# Patient Record
Sex: Male | Born: 1955
Health system: Southern US, Community
[De-identification: ages and names within clinical notes are randomized; demographics above are authoritative.]

## PROBLEM LIST (undated history)

## (undated) DIAGNOSIS — B182 Chronic viral hepatitis C: Secondary | ICD-10-CM

## (undated) DIAGNOSIS — H409 Unspecified glaucoma: Secondary | ICD-10-CM

## (undated) DIAGNOSIS — I10 Essential (primary) hypertension: Secondary | ICD-10-CM

## (undated) DIAGNOSIS — M159 Polyosteoarthritis, unspecified: Secondary | ICD-10-CM

## (undated) DIAGNOSIS — F319 Bipolar disorder, unspecified: Secondary | ICD-10-CM

## (undated) DIAGNOSIS — R350 Frequency of micturition: Secondary | ICD-10-CM

## (undated) DIAGNOSIS — M549 Dorsalgia, unspecified: Secondary | ICD-10-CM

## (undated) DIAGNOSIS — E785 Hyperlipidemia, unspecified: Secondary | ICD-10-CM

## (undated) DIAGNOSIS — M1990 Unspecified osteoarthritis, unspecified site: Secondary | ICD-10-CM

## (undated) DIAGNOSIS — K7460 Unspecified cirrhosis of liver: Secondary | ICD-10-CM

## (undated) DIAGNOSIS — B354 Tinea corporis: Secondary | ICD-10-CM

## (undated) DIAGNOSIS — I639 Cerebral infarction, unspecified: Secondary | ICD-10-CM

## (undated) HISTORY — DX: Frequency of micturition: R35.0

## (undated) HISTORY — DX: Unspecified glaucoma: H40.9

## (undated) HISTORY — DX: Bipolar disorder, unspecified: F31.9

## (undated) HISTORY — DX: Chronic viral hepatitis C: B18.2

## (undated) HISTORY — DX: Polyosteoarthritis, unspecified: M15.9

## (undated) HISTORY — DX: Unspecified cirrhosis of liver: K74.60

## (undated) HISTORY — PX: ABCESS DRAINAGE: SHX399

## (undated) HISTORY — DX: Unspecified osteoarthritis, unspecified site: M19.90

## (undated) HISTORY — DX: Tinea corporis: B35.4

---

## 2001-06-04 ENCOUNTER — Emergency Department (HOSPITAL_COMMUNITY): Admission: EM | Admit: 2001-06-04 | Discharge: 2001-06-04 | Payer: Self-pay | Admitting: Emergency Medicine

## 2001-06-04 ENCOUNTER — Encounter: Payer: Self-pay | Admitting: Emergency Medicine

## 2002-03-23 ENCOUNTER — Encounter: Payer: Self-pay | Admitting: Emergency Medicine

## 2002-03-23 ENCOUNTER — Emergency Department (HOSPITAL_COMMUNITY): Admission: EM | Admit: 2002-03-23 | Discharge: 2002-03-23 | Payer: Self-pay | Admitting: Emergency Medicine

## 2002-04-09 ENCOUNTER — Encounter: Admission: RE | Admit: 2002-04-09 | Discharge: 2002-07-08 | Payer: Self-pay | Admitting: Orthopaedic Surgery

## 2002-08-08 ENCOUNTER — Emergency Department (HOSPITAL_COMMUNITY): Admission: EM | Admit: 2002-08-08 | Discharge: 2002-08-08 | Payer: Self-pay | Admitting: Emergency Medicine

## 2002-08-08 ENCOUNTER — Encounter: Payer: Self-pay | Admitting: Emergency Medicine

## 2003-08-24 ENCOUNTER — Encounter: Payer: Self-pay | Admitting: Emergency Medicine

## 2003-08-24 ENCOUNTER — Emergency Department (HOSPITAL_COMMUNITY): Admission: EM | Admit: 2003-08-24 | Discharge: 2003-08-24 | Payer: Self-pay | Admitting: Emergency Medicine

## 2003-09-10 ENCOUNTER — Ambulatory Visit (HOSPITAL_COMMUNITY): Admission: RE | Admit: 2003-09-10 | Discharge: 2003-09-10 | Payer: Self-pay | Admitting: Internal Medicine

## 2003-09-10 ENCOUNTER — Encounter: Payer: Self-pay | Admitting: Internal Medicine

## 2003-11-15 ENCOUNTER — Emergency Department (HOSPITAL_COMMUNITY): Admission: AD | Admit: 2003-11-15 | Discharge: 2003-11-15 | Payer: Self-pay | Admitting: Family Medicine

## 2004-03-27 ENCOUNTER — Encounter: Admission: RE | Admit: 2004-03-27 | Discharge: 2004-03-27 | Payer: Self-pay | Admitting: Internal Medicine

## 2004-12-28 ENCOUNTER — Emergency Department (HOSPITAL_COMMUNITY): Admission: EM | Admit: 2004-12-28 | Discharge: 2004-12-28 | Payer: Self-pay | Admitting: Emergency Medicine

## 2005-01-19 ENCOUNTER — Ambulatory Visit: Payer: Self-pay | Admitting: Internal Medicine

## 2005-03-20 ENCOUNTER — Ambulatory Visit (HOSPITAL_COMMUNITY): Admission: RE | Admit: 2005-03-20 | Discharge: 2005-03-20 | Payer: Self-pay | Admitting: Gastroenterology

## 2005-06-18 ENCOUNTER — Ambulatory Visit (HOSPITAL_COMMUNITY): Admission: RE | Admit: 2005-06-18 | Discharge: 2005-06-18 | Payer: Self-pay | Admitting: Internal Medicine

## 2005-06-18 ENCOUNTER — Ambulatory Visit: Payer: Self-pay | Admitting: Internal Medicine

## 2005-11-27 ENCOUNTER — Encounter (INDEPENDENT_AMBULATORY_CARE_PROVIDER_SITE_OTHER): Payer: Self-pay | Admitting: Specialist

## 2005-11-27 ENCOUNTER — Other Ambulatory Visit: Admission: RE | Admit: 2005-11-27 | Discharge: 2005-11-27 | Payer: Self-pay | Admitting: Infectious Diseases

## 2005-11-27 ENCOUNTER — Ambulatory Visit: Payer: Self-pay | Admitting: Internal Medicine

## 2005-12-28 ENCOUNTER — Ambulatory Visit: Payer: Self-pay | Admitting: Hospitalist

## 2006-01-03 ENCOUNTER — Encounter: Admission: RE | Admit: 2006-01-03 | Discharge: 2006-04-03 | Payer: Self-pay | Admitting: Family Medicine

## 2006-02-01 ENCOUNTER — Ambulatory Visit (HOSPITAL_COMMUNITY): Admission: RE | Admit: 2006-02-01 | Discharge: 2006-02-01 | Payer: Self-pay | Admitting: Internal Medicine

## 2006-02-01 ENCOUNTER — Ambulatory Visit: Payer: Self-pay | Admitting: Internal Medicine

## 2006-02-05 ENCOUNTER — Ambulatory Visit: Payer: Self-pay | Admitting: Internal Medicine

## 2006-02-27 ENCOUNTER — Ambulatory Visit: Payer: Self-pay | Admitting: Internal Medicine

## 2006-06-12 ENCOUNTER — Ambulatory Visit: Payer: Self-pay | Admitting: Internal Medicine

## 2006-10-08 ENCOUNTER — Encounter (INDEPENDENT_AMBULATORY_CARE_PROVIDER_SITE_OTHER): Payer: Self-pay | Admitting: Internal Medicine

## 2006-10-08 ENCOUNTER — Ambulatory Visit: Payer: Self-pay | Admitting: Internal Medicine

## 2006-10-08 LAB — CONVERTED CEMR LAB
BUN: 12 mg/dL (ref 6–23)
CO2: 25 meq/L (ref 19–32)
Calcium: 9.4 mg/dL (ref 8.4–10.5)
Chloride: 106 meq/L (ref 96–112)
Creatinine, Ser: 1.05 mg/dL (ref 0.40–1.50)
Glucose, Bld: 86 mg/dL (ref 70–99)
Potassium: 3.8 meq/L (ref 3.5–5.3)
Sodium: 139 meq/L (ref 135–145)

## 2006-10-17 ENCOUNTER — Encounter (INDEPENDENT_AMBULATORY_CARE_PROVIDER_SITE_OTHER): Payer: Self-pay | Admitting: *Deleted

## 2006-10-17 ENCOUNTER — Ambulatory Visit: Payer: Self-pay | Admitting: Internal Medicine

## 2006-10-17 LAB — CONVERTED CEMR LAB
AST: 66 units/L — ABNORMAL HIGH (ref 0–37)
Alkaline Phosphatase: 74 units/L (ref 39–117)
BUN: 21 mg/dL (ref 6–23)
Creatinine, Ser: 1.04 mg/dL (ref 0.40–1.50)
Glucose, Bld: 89 mg/dL (ref 70–99)
HDL: 40 mg/dL (ref >39)
LDL Cholesterol: 105 mg/dL — ABNORMAL HIGH (ref 0–99)
Total Bilirubin: 0.6 mg/dL (ref 0.3–1.2)
Total CHOL/HDL Ratio: 4.5
Triglycerides: 177 mg/dL — ABNORMAL HIGH (ref <150)

## 2006-11-04 ENCOUNTER — Ambulatory Visit: Payer: Self-pay | Admitting: Hospitalist

## 2006-11-06 DIAGNOSIS — F528 Other sexual dysfunction not due to a substance or known physiological condition: Secondary | ICD-10-CM

## 2006-11-06 DIAGNOSIS — I10 Essential (primary) hypertension: Secondary | ICD-10-CM | POA: Insufficient documentation

## 2006-11-06 DIAGNOSIS — K219 Gastro-esophageal reflux disease without esophagitis: Secondary | ICD-10-CM

## 2006-11-06 DIAGNOSIS — M545 Low back pain: Secondary | ICD-10-CM

## 2006-11-08 ENCOUNTER — Ambulatory Visit (HOSPITAL_COMMUNITY): Admission: RE | Admit: 2006-11-08 | Discharge: 2006-11-08 | Payer: Self-pay | Admitting: *Deleted

## 2006-11-13 ENCOUNTER — Ambulatory Visit: Payer: Self-pay | Admitting: Internal Medicine

## 2006-11-13 ENCOUNTER — Encounter (INDEPENDENT_AMBULATORY_CARE_PROVIDER_SITE_OTHER): Payer: Self-pay | Admitting: *Deleted

## 2006-11-13 LAB — CONVERTED CEMR LAB
ALT: 65 units/L — ABNORMAL HIGH (ref 0–53)
AST: 70 units/L — ABNORMAL HIGH (ref 0–37)
BUN: 13 mg/dL (ref 6–23)
CO2: 28 meq/L (ref 19–32)
Calcium: 8.8 mg/dL (ref 8.4–10.5)
Creatinine, Ser: 0.8 mg/dL (ref 0.40–1.50)
GGT: 54 units/L — ABNORMAL HIGH (ref 7–51)
Hemoglobin, Urine: NEGATIVE
Hep A Total Ab: POSITIVE — AB
Hep B Core Total Ab: POSITIVE — AB
Hep B S Ab: POSITIVE — AB
Ketones, ur: NEGATIVE mg/dL
Leukocytes, UA: NEGATIVE
Lipase: 40 units/L (ref 11–59)
Protein, ur: NEGATIVE mg/dL
Total Bilirubin: 0.9 mg/dL (ref 0.3–1.2)
Urine Glucose: NEGATIVE mg/dL
pH: 5.5 (ref 5.0–8.0)

## 2006-11-20 ENCOUNTER — Ambulatory Visit: Payer: Self-pay | Admitting: Internal Medicine

## 2006-11-20 ENCOUNTER — Encounter (INDEPENDENT_AMBULATORY_CARE_PROVIDER_SITE_OTHER): Payer: Self-pay | Admitting: *Deleted

## 2006-11-20 LAB — CONVERTED CEMR LAB
BUN: 14 mg/dL (ref 6–23)
CO2: 24 meq/L (ref 19–32)
Calcium: 9.2 mg/dL (ref 8.4–10.5)
Chloride: 106 meq/L (ref 96–112)
Creatinine, Ser: 0.87 mg/dL (ref 0.40–1.50)
Glucose, Bld: 92 mg/dL (ref 70–99)

## 2006-11-29 ENCOUNTER — Encounter (INDEPENDENT_AMBULATORY_CARE_PROVIDER_SITE_OTHER): Payer: Self-pay | Admitting: Internal Medicine

## 2006-11-29 ENCOUNTER — Ambulatory Visit: Payer: Self-pay | Admitting: Internal Medicine

## 2006-11-29 LAB — CONVERTED CEMR LAB
BUN: 16 mg/dL (ref 6–23)
Calcium: 9.4 mg/dL (ref 8.4–10.5)
Creatinine, Ser: 0.86 mg/dL (ref 0.40–1.50)
Glucose, Bld: 88 mg/dL (ref 70–99)

## 2006-12-16 ENCOUNTER — Emergency Department (HOSPITAL_COMMUNITY): Admission: EM | Admit: 2006-12-16 | Discharge: 2006-12-17 | Payer: Self-pay | Admitting: Emergency Medicine

## 2006-12-17 HISTORY — PX: ANAL FISSURE REPAIR: SHX2312

## 2006-12-17 HISTORY — PX: COLONOSCOPY: SHX174

## 2006-12-29 DIAGNOSIS — B1710 Acute hepatitis C without hepatic coma: Secondary | ICD-10-CM | POA: Insufficient documentation

## 2007-01-14 ENCOUNTER — Ambulatory Visit: Payer: Self-pay | Admitting: Gastroenterology

## 2007-01-14 ENCOUNTER — Encounter (INDEPENDENT_AMBULATORY_CARE_PROVIDER_SITE_OTHER): Payer: Self-pay | Admitting: *Deleted

## 2007-02-06 ENCOUNTER — Ambulatory Visit: Payer: Self-pay | Admitting: Gastroenterology

## 2007-02-11 ENCOUNTER — Telehealth: Payer: Self-pay | Admitting: *Deleted

## 2007-02-12 ENCOUNTER — Encounter: Admission: RE | Admit: 2007-02-12 | Discharge: 2007-02-12 | Payer: Self-pay | Admitting: Internal Medicine

## 2007-02-12 ENCOUNTER — Telehealth: Payer: Self-pay | Admitting: *Deleted

## 2007-02-12 ENCOUNTER — Ambulatory Visit: Payer: Self-pay | Admitting: Internal Medicine

## 2007-02-12 ENCOUNTER — Inpatient Hospital Stay (HOSPITAL_COMMUNITY): Admission: RE | Admit: 2007-02-12 | Discharge: 2007-02-14 | Payer: Self-pay | Admitting: Internal Medicine

## 2007-02-26 ENCOUNTER — Telehealth (INDEPENDENT_AMBULATORY_CARE_PROVIDER_SITE_OTHER): Payer: Self-pay | Admitting: *Deleted

## 2007-03-04 ENCOUNTER — Ambulatory Visit (HOSPITAL_COMMUNITY): Admission: RE | Admit: 2007-03-04 | Discharge: 2007-03-04 | Payer: Self-pay | Admitting: Gastroenterology

## 2007-03-04 ENCOUNTER — Encounter (INDEPENDENT_AMBULATORY_CARE_PROVIDER_SITE_OTHER): Payer: Self-pay | Admitting: *Deleted

## 2007-03-04 ENCOUNTER — Encounter (INDEPENDENT_AMBULATORY_CARE_PROVIDER_SITE_OTHER): Payer: Self-pay | Admitting: Specialist

## 2007-03-06 ENCOUNTER — Ambulatory Visit: Payer: Self-pay | Admitting: Internal Medicine

## 2007-03-06 ENCOUNTER — Encounter: Payer: Self-pay | Admitting: Licensed Clinical Social Worker

## 2007-03-10 ENCOUNTER — Telehealth (INDEPENDENT_AMBULATORY_CARE_PROVIDER_SITE_OTHER): Payer: Self-pay | Admitting: *Deleted

## 2007-03-14 ENCOUNTER — Telehealth (INDEPENDENT_AMBULATORY_CARE_PROVIDER_SITE_OTHER): Payer: Self-pay | Admitting: *Deleted

## 2007-03-17 ENCOUNTER — Ambulatory Visit (HOSPITAL_COMMUNITY): Admission: RE | Admit: 2007-03-17 | Discharge: 2007-03-17 | Payer: Self-pay | Admitting: Gastroenterology

## 2007-03-27 ENCOUNTER — Ambulatory Visit: Payer: Self-pay | Admitting: Gastroenterology

## 2007-03-28 ENCOUNTER — Encounter (INDEPENDENT_AMBULATORY_CARE_PROVIDER_SITE_OTHER): Payer: Self-pay | Admitting: *Deleted

## 2007-04-03 ENCOUNTER — Ambulatory Visit: Payer: Self-pay | Admitting: Internal Medicine

## 2007-04-03 ENCOUNTER — Encounter (INDEPENDENT_AMBULATORY_CARE_PROVIDER_SITE_OTHER): Payer: Self-pay | Admitting: *Deleted

## 2007-04-03 DIAGNOSIS — K649 Unspecified hemorrhoids: Secondary | ICD-10-CM

## 2007-04-09 ENCOUNTER — Encounter (INDEPENDENT_AMBULATORY_CARE_PROVIDER_SITE_OTHER): Payer: Self-pay | Admitting: *Deleted

## 2007-04-09 ENCOUNTER — Ambulatory Visit: Payer: Self-pay | Admitting: Hospitalist

## 2007-04-09 DIAGNOSIS — L0293 Carbuncle, unspecified: Secondary | ICD-10-CM

## 2007-04-09 DIAGNOSIS — L0292 Furuncle, unspecified: Secondary | ICD-10-CM | POA: Insufficient documentation

## 2007-04-16 ENCOUNTER — Encounter (INDEPENDENT_AMBULATORY_CARE_PROVIDER_SITE_OTHER): Payer: Self-pay | Admitting: *Deleted

## 2007-04-21 ENCOUNTER — Ambulatory Visit: Payer: Self-pay | Admitting: *Deleted

## 2007-04-21 ENCOUNTER — Ambulatory Visit (HOSPITAL_COMMUNITY): Admission: RE | Admit: 2007-04-21 | Discharge: 2007-04-21 | Payer: Self-pay | Admitting: Internal Medicine

## 2007-04-21 DIAGNOSIS — M1990 Unspecified osteoarthritis, unspecified site: Secondary | ICD-10-CM | POA: Insufficient documentation

## 2007-04-22 ENCOUNTER — Telehealth (INDEPENDENT_AMBULATORY_CARE_PROVIDER_SITE_OTHER): Payer: Self-pay | Admitting: Pharmacy Technician

## 2007-04-24 ENCOUNTER — Telehealth: Payer: Self-pay | Admitting: *Deleted

## 2007-04-27 ENCOUNTER — Emergency Department (HOSPITAL_COMMUNITY): Admission: EM | Admit: 2007-04-27 | Discharge: 2007-04-27 | Payer: Self-pay | Admitting: Emergency Medicine

## 2007-04-28 ENCOUNTER — Telehealth: Payer: Self-pay | Admitting: *Deleted

## 2007-04-28 ENCOUNTER — Encounter (INDEPENDENT_AMBULATORY_CARE_PROVIDER_SITE_OTHER): Payer: Self-pay | Admitting: *Deleted

## 2007-04-30 ENCOUNTER — Ambulatory Visit: Payer: Self-pay | Admitting: Internal Medicine

## 2007-04-30 ENCOUNTER — Encounter (INDEPENDENT_AMBULATORY_CARE_PROVIDER_SITE_OTHER): Payer: Self-pay | Admitting: *Deleted

## 2007-04-30 ENCOUNTER — Encounter (INDEPENDENT_AMBULATORY_CARE_PROVIDER_SITE_OTHER): Payer: Self-pay | Admitting: Pulmonary Disease

## 2007-04-30 LAB — CONVERTED CEMR LAB
ALT: 47 units/L (ref 0–53)
AST: 41 units/L — ABNORMAL HIGH (ref 0–37)
Albumin: 4.3 g/dL (ref 3.5–5.2)
Alkaline Phosphatase: 67 units/L (ref 39–117)
Glucose, Bld: 96 mg/dL (ref 70–99)
Potassium: 4.2 meq/L (ref 3.5–5.3)
Sodium: 137 meq/L (ref 135–145)
Total Bilirubin: 0.5 mg/dL (ref 0.3–1.2)
Total Protein: 9.3 g/dL — ABNORMAL HIGH (ref 6.0–8.3)

## 2007-05-05 ENCOUNTER — Encounter (INDEPENDENT_AMBULATORY_CARE_PROVIDER_SITE_OTHER): Payer: Self-pay | Admitting: Pulmonary Disease

## 2007-05-15 ENCOUNTER — Ambulatory Visit: Payer: Self-pay | Admitting: Gastroenterology

## 2007-05-22 ENCOUNTER — Emergency Department (HOSPITAL_COMMUNITY): Admission: EM | Admit: 2007-05-22 | Discharge: 2007-05-22 | Payer: Self-pay | Admitting: Emergency Medicine

## 2007-05-23 ENCOUNTER — Ambulatory Visit: Payer: Self-pay | Admitting: Hospitalist

## 2007-05-23 ENCOUNTER — Encounter: Admission: RE | Admit: 2007-05-23 | Discharge: 2007-05-23 | Payer: Self-pay | Admitting: Internal Medicine

## 2007-05-23 ENCOUNTER — Ambulatory Visit: Payer: Self-pay | Admitting: Internal Medicine

## 2007-05-23 ENCOUNTER — Inpatient Hospital Stay (HOSPITAL_COMMUNITY): Admission: AD | Admit: 2007-05-23 | Discharge: 2007-05-26 | Payer: Self-pay | Admitting: Hospitalist

## 2007-05-23 DIAGNOSIS — L0231 Cutaneous abscess of buttock: Secondary | ICD-10-CM

## 2007-05-23 DIAGNOSIS — L03317 Cellulitis of buttock: Secondary | ICD-10-CM

## 2007-06-05 ENCOUNTER — Encounter (INDEPENDENT_AMBULATORY_CARE_PROVIDER_SITE_OTHER): Payer: Self-pay | Admitting: *Deleted

## 2007-06-05 ENCOUNTER — Telehealth (INDEPENDENT_AMBULATORY_CARE_PROVIDER_SITE_OTHER): Payer: Self-pay | Admitting: *Deleted

## 2007-06-06 ENCOUNTER — Encounter (INDEPENDENT_AMBULATORY_CARE_PROVIDER_SITE_OTHER): Payer: Self-pay | Admitting: *Deleted

## 2007-06-06 ENCOUNTER — Encounter (INDEPENDENT_AMBULATORY_CARE_PROVIDER_SITE_OTHER): Payer: Self-pay | Admitting: Internal Medicine

## 2007-06-11 ENCOUNTER — Ambulatory Visit: Payer: Self-pay | Admitting: Internal Medicine

## 2007-06-11 ENCOUNTER — Encounter (INDEPENDENT_AMBULATORY_CARE_PROVIDER_SITE_OTHER): Payer: Self-pay | Admitting: Internal Medicine

## 2007-06-11 DIAGNOSIS — F329 Major depressive disorder, single episode, unspecified: Secondary | ICD-10-CM

## 2007-06-26 ENCOUNTER — Ambulatory Visit: Payer: Self-pay | Admitting: Internal Medicine

## 2007-06-26 DIAGNOSIS — R599 Enlarged lymph nodes, unspecified: Secondary | ICD-10-CM | POA: Insufficient documentation

## 2007-07-01 ENCOUNTER — Encounter: Admission: RE | Admit: 2007-07-01 | Discharge: 2007-07-01 | Payer: Self-pay | Admitting: Internal Medicine

## 2007-07-04 ENCOUNTER — Encounter: Admission: RE | Admit: 2007-07-04 | Discharge: 2007-07-04 | Payer: Self-pay | Admitting: Orthopedic Surgery

## 2007-07-08 ENCOUNTER — Ambulatory Visit: Payer: Self-pay | Admitting: Gastroenterology

## 2007-07-16 ENCOUNTER — Encounter (INDEPENDENT_AMBULATORY_CARE_PROVIDER_SITE_OTHER): Payer: Self-pay | Admitting: Internal Medicine

## 2007-07-17 ENCOUNTER — Encounter (INDEPENDENT_AMBULATORY_CARE_PROVIDER_SITE_OTHER): Payer: Self-pay | Admitting: Internal Medicine

## 2007-07-17 ENCOUNTER — Ambulatory Visit: Payer: Self-pay | Admitting: *Deleted

## 2007-07-21 ENCOUNTER — Encounter: Admission: RE | Admit: 2007-07-21 | Discharge: 2007-07-21 | Payer: Self-pay | Admitting: Orthopedic Surgery

## 2007-07-23 ENCOUNTER — Telehealth: Payer: Self-pay | Admitting: *Deleted

## 2007-07-30 ENCOUNTER — Telehealth: Payer: Self-pay | Admitting: Infectious Disease

## 2007-08-05 ENCOUNTER — Encounter (INDEPENDENT_AMBULATORY_CARE_PROVIDER_SITE_OTHER): Payer: Self-pay | Admitting: Internal Medicine

## 2007-08-08 ENCOUNTER — Telehealth (INDEPENDENT_AMBULATORY_CARE_PROVIDER_SITE_OTHER): Payer: Self-pay | Admitting: *Deleted

## 2007-08-08 ENCOUNTER — Encounter (INDEPENDENT_AMBULATORY_CARE_PROVIDER_SITE_OTHER): Payer: Self-pay | Admitting: Internal Medicine

## 2007-08-19 ENCOUNTER — Encounter (INDEPENDENT_AMBULATORY_CARE_PROVIDER_SITE_OTHER): Payer: Self-pay | Admitting: Internal Medicine

## 2007-08-19 ENCOUNTER — Ambulatory Visit: Payer: Self-pay | Admitting: Internal Medicine

## 2007-08-19 DIAGNOSIS — R809 Proteinuria, unspecified: Secondary | ICD-10-CM | POA: Insufficient documentation

## 2007-09-02 ENCOUNTER — Ambulatory Visit: Payer: Self-pay | Admitting: Gastroenterology

## 2007-09-02 ENCOUNTER — Telehealth: Payer: Self-pay | Admitting: *Deleted

## 2007-09-03 ENCOUNTER — Encounter (INDEPENDENT_AMBULATORY_CARE_PROVIDER_SITE_OTHER): Payer: Self-pay | Admitting: Internal Medicine

## 2007-09-04 ENCOUNTER — Emergency Department (HOSPITAL_COMMUNITY): Admission: EM | Admit: 2007-09-04 | Discharge: 2007-09-04 | Payer: Self-pay | Admitting: Emergency Medicine

## 2007-09-09 ENCOUNTER — Encounter (INDEPENDENT_AMBULATORY_CARE_PROVIDER_SITE_OTHER): Payer: Self-pay | Admitting: Internal Medicine

## 2007-09-16 ENCOUNTER — Ambulatory Visit: Payer: Self-pay | Admitting: Gastroenterology

## 2007-10-07 ENCOUNTER — Telehealth: Payer: Self-pay | Admitting: *Deleted

## 2007-10-08 ENCOUNTER — Encounter (INDEPENDENT_AMBULATORY_CARE_PROVIDER_SITE_OTHER): Payer: Self-pay | Admitting: Internal Medicine

## 2007-10-28 ENCOUNTER — Ambulatory Visit (HOSPITAL_COMMUNITY): Admission: RE | Admit: 2007-10-28 | Discharge: 2007-10-28 | Payer: Self-pay | Admitting: Surgery

## 2007-10-30 ENCOUNTER — Ambulatory Visit: Payer: Self-pay | Admitting: Gastroenterology

## 2007-11-04 ENCOUNTER — Telehealth: Payer: Self-pay | Admitting: *Deleted

## 2007-11-06 ENCOUNTER — Encounter (INDEPENDENT_AMBULATORY_CARE_PROVIDER_SITE_OTHER): Payer: Self-pay | Admitting: Internal Medicine

## 2007-11-11 ENCOUNTER — Ambulatory Visit: Payer: Self-pay | Admitting: Gastroenterology

## 2007-11-12 ENCOUNTER — Encounter (INDEPENDENT_AMBULATORY_CARE_PROVIDER_SITE_OTHER): Payer: Self-pay | Admitting: Internal Medicine

## 2007-11-12 ENCOUNTER — Ambulatory Visit: Payer: Self-pay | Admitting: Internal Medicine

## 2007-11-12 LAB — CONVERTED CEMR LAB
ALT: 46 units/L (ref 0–53)
CO2: 25 meq/L (ref 19–32)
Calcium: 8.4 mg/dL (ref 8.4–10.5)
Chloride: 99 meq/L (ref 96–112)
Potassium: 3.9 meq/L (ref 3.5–5.3)
Sodium: 136 meq/L (ref 135–145)
Total Bilirubin: 0.7 mg/dL (ref 0.3–1.2)
Total Protein: 8.4 g/dL — ABNORMAL HIGH (ref 6.0–8.3)

## 2007-11-18 ENCOUNTER — Telehealth (INDEPENDENT_AMBULATORY_CARE_PROVIDER_SITE_OTHER): Payer: Self-pay | Admitting: Internal Medicine

## 2007-11-24 ENCOUNTER — Telehealth: Payer: Self-pay | Admitting: *Deleted

## 2007-11-25 ENCOUNTER — Telehealth (INDEPENDENT_AMBULATORY_CARE_PROVIDER_SITE_OTHER): Payer: Self-pay | Admitting: Internal Medicine

## 2007-12-03 ENCOUNTER — Ambulatory Visit (HOSPITAL_COMMUNITY): Admission: RE | Admit: 2007-12-03 | Discharge: 2007-12-03 | Payer: Self-pay | Admitting: Surgery

## 2007-12-03 ENCOUNTER — Encounter (INDEPENDENT_AMBULATORY_CARE_PROVIDER_SITE_OTHER): Payer: Self-pay | Admitting: Surgery

## 2007-12-05 ENCOUNTER — Telehealth: Payer: Self-pay | Admitting: *Deleted

## 2007-12-06 ENCOUNTER — Encounter (INDEPENDENT_AMBULATORY_CARE_PROVIDER_SITE_OTHER): Payer: Self-pay | Admitting: Internal Medicine

## 2007-12-08 ENCOUNTER — Telehealth (INDEPENDENT_AMBULATORY_CARE_PROVIDER_SITE_OTHER): Payer: Self-pay | Admitting: Internal Medicine

## 2007-12-09 ENCOUNTER — Ambulatory Visit: Payer: Self-pay | Admitting: Gastroenterology

## 2007-12-24 ENCOUNTER — Ambulatory Visit: Payer: Self-pay | Admitting: Internal Medicine

## 2007-12-29 ENCOUNTER — Encounter (INDEPENDENT_AMBULATORY_CARE_PROVIDER_SITE_OTHER): Payer: Self-pay | Admitting: Internal Medicine

## 2008-01-05 ENCOUNTER — Telehealth: Payer: Self-pay | Admitting: *Deleted

## 2008-01-07 ENCOUNTER — Encounter (INDEPENDENT_AMBULATORY_CARE_PROVIDER_SITE_OTHER): Payer: Self-pay | Admitting: Internal Medicine

## 2008-01-15 ENCOUNTER — Ambulatory Visit: Payer: Self-pay | Admitting: Gastroenterology

## 2008-01-19 ENCOUNTER — Encounter
Admission: RE | Admit: 2008-01-19 | Discharge: 2008-01-20 | Payer: Self-pay | Admitting: Physical Medicine & Rehabilitation

## 2008-01-19 ENCOUNTER — Ambulatory Visit: Payer: Self-pay | Admitting: Physical Medicine & Rehabilitation

## 2008-01-21 ENCOUNTER — Telehealth (INDEPENDENT_AMBULATORY_CARE_PROVIDER_SITE_OTHER): Payer: Self-pay | Admitting: Internal Medicine

## 2008-01-22 ENCOUNTER — Encounter (INDEPENDENT_AMBULATORY_CARE_PROVIDER_SITE_OTHER): Payer: Self-pay | Admitting: Internal Medicine

## 2008-01-27 ENCOUNTER — Ambulatory Visit: Payer: Self-pay | Admitting: Internal Medicine

## 2008-02-03 ENCOUNTER — Telehealth (INDEPENDENT_AMBULATORY_CARE_PROVIDER_SITE_OTHER): Payer: Self-pay | Admitting: Internal Medicine

## 2008-02-04 ENCOUNTER — Encounter (INDEPENDENT_AMBULATORY_CARE_PROVIDER_SITE_OTHER): Payer: Self-pay | Admitting: Internal Medicine

## 2008-02-12 ENCOUNTER — Ambulatory Visit: Payer: Self-pay | Admitting: Gastroenterology

## 2008-02-16 ENCOUNTER — Emergency Department (HOSPITAL_COMMUNITY): Admission: EM | Admit: 2008-02-16 | Discharge: 2008-02-16 | Payer: Self-pay | Admitting: Emergency Medicine

## 2008-03-03 ENCOUNTER — Encounter (INDEPENDENT_AMBULATORY_CARE_PROVIDER_SITE_OTHER): Payer: Self-pay | Admitting: Internal Medicine

## 2008-03-03 ENCOUNTER — Telehealth: Payer: Self-pay | Admitting: *Deleted

## 2008-03-11 ENCOUNTER — Ambulatory Visit: Payer: Self-pay | Admitting: Gastroenterology

## 2008-03-25 ENCOUNTER — Ambulatory Visit: Payer: Self-pay | Admitting: Gastroenterology

## 2008-03-31 ENCOUNTER — Telehealth: Payer: Self-pay | Admitting: Internal Medicine

## 2008-04-22 ENCOUNTER — Ambulatory Visit: Payer: Self-pay | Admitting: Gastroenterology

## 2008-04-29 ENCOUNTER — Telehealth (INDEPENDENT_AMBULATORY_CARE_PROVIDER_SITE_OTHER): Payer: Self-pay | Admitting: Internal Medicine

## 2008-05-05 ENCOUNTER — Ambulatory Visit: Payer: Self-pay | Admitting: *Deleted

## 2008-05-05 ENCOUNTER — Encounter (INDEPENDENT_AMBULATORY_CARE_PROVIDER_SITE_OTHER): Payer: Self-pay | Admitting: Internal Medicine

## 2008-05-07 LAB — CONVERTED CEMR LAB
Benzodiazepines.: NEGATIVE
Marijuana Metabolite: NEGATIVE
Methadone: NEGATIVE
Propoxyphene: NEGATIVE

## 2008-05-17 ENCOUNTER — Encounter (INDEPENDENT_AMBULATORY_CARE_PROVIDER_SITE_OTHER): Payer: Self-pay | Admitting: Internal Medicine

## 2008-05-20 ENCOUNTER — Ambulatory Visit: Payer: Self-pay | Admitting: Gastroenterology

## 2008-05-20 ENCOUNTER — Ambulatory Visit: Payer: Self-pay | Admitting: Internal Medicine

## 2008-05-20 DIAGNOSIS — F1910 Other psychoactive substance abuse, uncomplicated: Secondary | ICD-10-CM

## 2008-06-03 ENCOUNTER — Encounter (INDEPENDENT_AMBULATORY_CARE_PROVIDER_SITE_OTHER): Payer: Self-pay | Admitting: *Deleted

## 2008-06-03 ENCOUNTER — Ambulatory Visit: Payer: Self-pay | Admitting: Internal Medicine

## 2008-06-03 LAB — CONVERTED CEMR LAB
ALT: 25 units/L (ref 0–53)
AST: 47 units/L — ABNORMAL HIGH (ref 0–37)
Albumin: 3.8 g/dL (ref 3.5–5.2)
Basophils Absolute: 0 10*3/uL (ref 0.0–0.1)
Basophils Relative: 0 % (ref 0–1)
Calcium: 8.1 mg/dL — ABNORMAL LOW (ref 8.4–10.5)
Chloride: 109 meq/L (ref 96–112)
MCHC: 31.6 g/dL (ref 30.0–36.0)
Monocytes Relative: 16 % — ABNORMAL HIGH (ref 3–12)
Neutro Abs: 2.4 10*3/uL (ref 1.7–7.7)
Neutrophils Relative %: 58 % (ref 43–77)
Potassium: 3.6 meq/L (ref 3.5–5.3)
RBC: 2.92 M/uL — ABNORMAL LOW (ref 4.22–5.81)
Sodium: 141 meq/L (ref 135–145)
Total Protein: 7.5 g/dL (ref 6.0–8.3)

## 2008-06-09 ENCOUNTER — Encounter (INDEPENDENT_AMBULATORY_CARE_PROVIDER_SITE_OTHER): Payer: Self-pay | Admitting: Internal Medicine

## 2008-06-09 ENCOUNTER — Ambulatory Visit: Payer: Self-pay | Admitting: Internal Medicine

## 2008-06-17 ENCOUNTER — Ambulatory Visit: Payer: Self-pay | Admitting: Gastroenterology

## 2008-06-23 ENCOUNTER — Encounter (INDEPENDENT_AMBULATORY_CARE_PROVIDER_SITE_OTHER): Payer: Self-pay | Admitting: *Deleted

## 2008-07-01 ENCOUNTER — Telehealth (INDEPENDENT_AMBULATORY_CARE_PROVIDER_SITE_OTHER): Payer: Self-pay | Admitting: Internal Medicine

## 2008-07-05 ENCOUNTER — Encounter (INDEPENDENT_AMBULATORY_CARE_PROVIDER_SITE_OTHER): Payer: Self-pay | Admitting: Internal Medicine

## 2008-07-14 ENCOUNTER — Emergency Department (HOSPITAL_COMMUNITY): Admission: EM | Admit: 2008-07-14 | Discharge: 2008-07-14 | Payer: Self-pay | Admitting: Emergency Medicine

## 2008-07-15 ENCOUNTER — Ambulatory Visit: Payer: Self-pay | Admitting: Gastroenterology

## 2008-07-20 ENCOUNTER — Telehealth (INDEPENDENT_AMBULATORY_CARE_PROVIDER_SITE_OTHER): Payer: Self-pay | Admitting: Internal Medicine

## 2008-07-22 ENCOUNTER — Encounter (INDEPENDENT_AMBULATORY_CARE_PROVIDER_SITE_OTHER): Payer: Self-pay | Admitting: Internal Medicine

## 2008-07-26 ENCOUNTER — Encounter (INDEPENDENT_AMBULATORY_CARE_PROVIDER_SITE_OTHER): Payer: Self-pay | Admitting: Internal Medicine

## 2008-07-30 ENCOUNTER — Telehealth: Payer: Self-pay | Admitting: *Deleted

## 2008-08-11 ENCOUNTER — Ambulatory Visit (HOSPITAL_COMMUNITY): Admission: RE | Admit: 2008-08-11 | Discharge: 2008-08-11 | Payer: Self-pay | Admitting: Gastroenterology

## 2008-08-25 ENCOUNTER — Ambulatory Visit: Payer: Self-pay | Admitting: *Deleted

## 2008-08-25 ENCOUNTER — Encounter (INDEPENDENT_AMBULATORY_CARE_PROVIDER_SITE_OTHER): Payer: Self-pay | Admitting: Internal Medicine

## 2008-08-27 LAB — CONVERTED CEMR LAB
Barbiturate Quant, Ur: NEGATIVE
Creatinine,U: 71.9 mg/dL
Opiates: NEGATIVE
Propoxyphene: NEGATIVE

## 2008-09-02 ENCOUNTER — Ambulatory Visit: Payer: Self-pay | Admitting: Gastroenterology

## 2008-09-16 ENCOUNTER — Encounter (INDEPENDENT_AMBULATORY_CARE_PROVIDER_SITE_OTHER): Payer: Self-pay | Admitting: Internal Medicine

## 2008-09-30 ENCOUNTER — Telehealth (INDEPENDENT_AMBULATORY_CARE_PROVIDER_SITE_OTHER): Payer: Self-pay | Admitting: Internal Medicine

## 2008-10-04 ENCOUNTER — Encounter (INDEPENDENT_AMBULATORY_CARE_PROVIDER_SITE_OTHER): Payer: Self-pay | Admitting: Internal Medicine

## 2008-10-16 ENCOUNTER — Emergency Department (HOSPITAL_COMMUNITY): Admission: EM | Admit: 2008-10-16 | Discharge: 2008-10-16 | Payer: Self-pay | Admitting: Family Medicine

## 2008-10-28 ENCOUNTER — Ambulatory Visit: Payer: Self-pay | Admitting: *Deleted

## 2008-12-01 ENCOUNTER — Telehealth (INDEPENDENT_AMBULATORY_CARE_PROVIDER_SITE_OTHER): Payer: Self-pay | Admitting: Internal Medicine

## 2008-12-02 ENCOUNTER — Ambulatory Visit: Payer: Self-pay | Admitting: Gastroenterology

## 2008-12-02 ENCOUNTER — Encounter (INDEPENDENT_AMBULATORY_CARE_PROVIDER_SITE_OTHER): Payer: Self-pay | Admitting: Internal Medicine

## 2008-12-30 ENCOUNTER — Telehealth (INDEPENDENT_AMBULATORY_CARE_PROVIDER_SITE_OTHER): Payer: Self-pay | Admitting: Internal Medicine

## 2008-12-31 ENCOUNTER — Encounter (INDEPENDENT_AMBULATORY_CARE_PROVIDER_SITE_OTHER): Payer: Self-pay | Admitting: Internal Medicine

## 2009-01-31 ENCOUNTER — Encounter (INDEPENDENT_AMBULATORY_CARE_PROVIDER_SITE_OTHER): Payer: Self-pay | Admitting: Internal Medicine

## 2009-01-31 ENCOUNTER — Telehealth (INDEPENDENT_AMBULATORY_CARE_PROVIDER_SITE_OTHER): Payer: Self-pay | Admitting: Internal Medicine

## 2009-02-21 ENCOUNTER — Encounter (INDEPENDENT_AMBULATORY_CARE_PROVIDER_SITE_OTHER): Payer: Self-pay | Admitting: Internal Medicine

## 2009-02-28 ENCOUNTER — Telehealth (INDEPENDENT_AMBULATORY_CARE_PROVIDER_SITE_OTHER): Payer: Self-pay | Admitting: Internal Medicine

## 2009-03-25 ENCOUNTER — Encounter: Payer: Self-pay | Admitting: Internal Medicine

## 2009-03-25 ENCOUNTER — Encounter: Payer: Self-pay | Admitting: *Deleted

## 2009-04-28 ENCOUNTER — Telehealth: Payer: Self-pay | Admitting: Internal Medicine

## 2009-04-29 ENCOUNTER — Encounter (INDEPENDENT_AMBULATORY_CARE_PROVIDER_SITE_OTHER): Payer: Self-pay | Admitting: Internal Medicine

## 2009-05-17 ENCOUNTER — Encounter (INDEPENDENT_AMBULATORY_CARE_PROVIDER_SITE_OTHER): Payer: Self-pay | Admitting: Internal Medicine

## 2009-05-18 ENCOUNTER — Telehealth (INDEPENDENT_AMBULATORY_CARE_PROVIDER_SITE_OTHER): Payer: Self-pay | Admitting: Internal Medicine

## 2009-05-24 ENCOUNTER — Ambulatory Visit: Payer: Self-pay | Admitting: Internal Medicine

## 2009-05-24 ENCOUNTER — Encounter (INDEPENDENT_AMBULATORY_CARE_PROVIDER_SITE_OTHER): Payer: Self-pay | Admitting: Internal Medicine

## 2009-05-25 ENCOUNTER — Encounter (INDEPENDENT_AMBULATORY_CARE_PROVIDER_SITE_OTHER): Payer: Self-pay | Admitting: Internal Medicine

## 2009-05-25 DIAGNOSIS — D72829 Elevated white blood cell count, unspecified: Secondary | ICD-10-CM

## 2009-05-25 LAB — CONVERTED CEMR LAB
ALT: 47 units/L (ref 0–53)
AST: 61 units/L — ABNORMAL HIGH (ref 0–37)
Basophils Relative: 0 % (ref 0–1)
Creatinine, Ser: 1 mg/dL (ref 0.40–1.50)
GFR calc Af Amer: >60 mL/min (ref >60)
Hemoglobin: 13 g/dL (ref 13.0–17.0)
INR: 1.1 (ref 0.0–1.5)
Lymphs Abs: 6.7 10*3/uL — ABNORMAL HIGH (ref 0.7–4.0)
MCHC: 34.8 g/dL (ref 30.0–36.0)
MCV: 92.8 fL (ref 78.0–100.0)
Monocytes Absolute: 1.1 10*3/uL — ABNORMAL HIGH (ref 0.1–1.0)
Monocytes Relative: 9 % (ref 3–12)
Neutro Abs: 4.1 10*3/uL (ref 1.7–7.7)
RBC: 4.03 M/uL — ABNORMAL LOW (ref 4.22–5.81)
Total Bilirubin: 0.6 mg/dL (ref 0.3–1.2)
WBC: 12.2 10*3/uL — ABNORMAL HIGH (ref 4.0–10.5)

## 2009-05-30 ENCOUNTER — Ambulatory Visit (HOSPITAL_COMMUNITY): Admission: RE | Admit: 2009-05-30 | Discharge: 2009-05-30 | Payer: Self-pay | Admitting: Internal Medicine

## 2009-06-09 ENCOUNTER — Encounter (INDEPENDENT_AMBULATORY_CARE_PROVIDER_SITE_OTHER): Payer: Self-pay | Admitting: Internal Medicine

## 2009-06-23 ENCOUNTER — Telehealth: Payer: Self-pay | Admitting: *Deleted

## 2009-06-28 ENCOUNTER — Ambulatory Visit: Payer: Self-pay | Admitting: Gastroenterology

## 2009-06-30 ENCOUNTER — Ambulatory Visit: Payer: Self-pay | Admitting: Internal Medicine

## 2009-06-30 ENCOUNTER — Encounter (INDEPENDENT_AMBULATORY_CARE_PROVIDER_SITE_OTHER): Payer: Self-pay | Admitting: Internal Medicine

## 2009-06-30 DIAGNOSIS — M79609 Pain in unspecified limb: Secondary | ICD-10-CM

## 2009-06-30 DIAGNOSIS — G4700 Insomnia, unspecified: Secondary | ICD-10-CM | POA: Insufficient documentation

## 2009-06-30 LAB — CONVERTED CEMR LAB
ALT: 32 units/L (ref 0–53)
ALT: 32 units/L (ref 0–53)
AST: 37 units/L (ref 0–37)
AST: 37 units/L (ref 0–37)
Albumin: 4.4 g/dL (ref 3.5–5.2)
Alkaline Phosphatase: 65 units/L (ref 39–117)
BUN: 23 mg/dL (ref 6–23)
CO2: 25 meq/L (ref 19–32)
Calcium: 9.3 mg/dL (ref 8.4–10.5)
Calcium: 9.3 mg/dL (ref 8.4–10.5)
Chloride: 103 meq/L (ref 96–112)
Chloride: 103 meq/L (ref 96–112)
HDL: 31 mg/dL — ABNORMAL LOW (ref >39)
LDL Cholesterol: 91 mg/dL (ref 0–99)
LDL Cholesterol: 91 mg/dL (ref 0–99)
Potassium: 4.2 meq/L (ref 3.5–5.3)
Potassium: 4.2 meq/L (ref 3.5–5.3)
Sed Rate: 16 mm/hr (ref 0–16)
Sodium: 139 meq/L (ref 135–145)
Sodium: 139 meq/L (ref 135–145)
Total Protein: 8.3 g/dL (ref 6.0–8.3)
Total Protein: 8.3 g/dL (ref 6.0–8.3)
Triglycerides: 361 mg/dL — ABNORMAL HIGH (ref <150)
VLDL: 72 mg/dL — ABNORMAL HIGH (ref 0–40)

## 2009-07-06 ENCOUNTER — Encounter (INDEPENDENT_AMBULATORY_CARE_PROVIDER_SITE_OTHER): Payer: Self-pay | Admitting: Internal Medicine

## 2009-07-06 ENCOUNTER — Ambulatory Visit: Payer: Self-pay | Admitting: Internal Medicine

## 2009-07-06 DIAGNOSIS — H409 Unspecified glaucoma: Secondary | ICD-10-CM | POA: Insufficient documentation

## 2009-07-07 LAB — CONVERTED CEMR LAB
Free T4: 0.9 ng/dL (ref 0.80–1.80)
TSH: 3.628 microintl units/mL (ref 0.350–4.500)

## 2009-09-26 ENCOUNTER — Encounter (INDEPENDENT_AMBULATORY_CARE_PROVIDER_SITE_OTHER): Payer: Self-pay | Admitting: Internal Medicine

## 2010-01-30 ENCOUNTER — Telehealth (INDEPENDENT_AMBULATORY_CARE_PROVIDER_SITE_OTHER): Payer: Self-pay | Admitting: Internal Medicine

## 2010-03-07 ENCOUNTER — Emergency Department (HOSPITAL_COMMUNITY): Admission: EM | Admit: 2010-03-07 | Discharge: 2010-03-07 | Payer: Self-pay | Admitting: Family Medicine

## 2010-03-27 ENCOUNTER — Encounter: Admission: RE | Admit: 2010-03-27 | Discharge: 2010-03-27 | Payer: Self-pay | Admitting: Neurosurgery

## 2010-04-12 ENCOUNTER — Ambulatory Visit (HOSPITAL_COMMUNITY): Admission: RE | Admit: 2010-04-12 | Discharge: 2010-04-13 | Payer: Self-pay | Admitting: Urology

## 2010-04-12 HISTORY — PX: PENILE PROSTHESIS IMPLANT: SHX240

## 2010-09-11 ENCOUNTER — Emergency Department (HOSPITAL_COMMUNITY)
Admission: EM | Admit: 2010-09-11 | Discharge: 2010-09-11 | Payer: Self-pay | Source: Home / Self Care | Admitting: Family Medicine

## 2010-09-12 ENCOUNTER — Emergency Department (HOSPITAL_COMMUNITY): Admission: EM | Admit: 2010-09-12 | Discharge: 2010-09-12 | Payer: Self-pay | Admitting: Emergency Medicine

## 2010-10-02 DIAGNOSIS — M130 Polyarthritis, unspecified: Secondary | ICD-10-CM | POA: Insufficient documentation

## 2010-10-02 DIAGNOSIS — Z87891 Personal history of nicotine dependence: Secondary | ICD-10-CM | POA: Insufficient documentation

## 2010-11-02 DIAGNOSIS — R21 Rash and other nonspecific skin eruption: Secondary | ICD-10-CM | POA: Insufficient documentation

## 2010-12-04 DIAGNOSIS — J209 Acute bronchitis, unspecified: Secondary | ICD-10-CM | POA: Insufficient documentation

## 2011-01-07 ENCOUNTER — Encounter: Payer: Self-pay | Admitting: Neurosurgery

## 2011-01-07 ENCOUNTER — Encounter: Payer: Self-pay | Admitting: Orthopedic Surgery

## 2011-01-08 ENCOUNTER — Encounter: Payer: Self-pay | Admitting: Gastroenterology

## 2011-01-08 ENCOUNTER — Encounter: Payer: Self-pay | Admitting: Internal Medicine

## 2011-01-14 LAB — CONVERTED CEMR LAB
ALT: 41 units/L (ref 0–53)
Albumin, U: DETECTED %
Alkaline Phosphatase: 64 units/L (ref 39–117)
Alpha-2-Globulin: 7.4 % (ref 7.1–11.8)
Basophils Relative: 1 % (ref 0–1)
Beta Globulin: 5.8 % (ref 4.7–7.2)
Eosinophils Absolute: 0.1 10*3/uL (ref 0.0–0.7)
Gamma Globulin: 31.3 % — ABNORMAL HIGH (ref 11.1–18.8)
IgM, Serum: 173 mg/dL (ref 60–263)
Lymphs Abs: 5.3 10*3/uL — ABNORMAL HIGH (ref 0.7–3.3)
MCV: 87.7 fL (ref 78.0–100.0)
Neutro Abs: 2.7 10*3/uL (ref 1.7–7.7)
Neutrophils Relative %: 30 % — ABNORMAL LOW (ref 43–77)
Platelets: 292 10*3/uL (ref 150–400)
Potassium: 4 meq/L (ref 3.5–5.3)
Sed Rate: 23 mm/hr — ABNORMAL HIGH (ref 0–16)
Sodium: 139 meq/L (ref 135–145)
Total Bilirubin: 0.6 mg/dL (ref 0.3–1.2)
Total Protein, Serum Electrophoresis: 9.1 g/dL — ABNORMAL HIGH (ref 6.0–8.3)
Total Protein: 8.8 g/dL — ABNORMAL HIGH (ref 6.0–8.3)
WBC: 9.1 10*3/uL (ref 4.0–10.5)

## 2011-01-16 NOTE — Progress Notes (Signed)
Summary: med  refill/gp  Phone Note Refill Request Message from:  Fax from Pharmacy on January 30, 2010 10:29 AM  Refills Requested: Medication #1:  LEVITRA 20 MG TABS take by mouth as needed for erectile dysfunction   Last Refilled: 01/11/2010 Last appt. 07/06/09.   Method Requested: Electronic Initial call taken by: Chinita Pester RN,  January 30, 2010 10:29 AM    Prescriptions: LEVITRA 20 MG TABS (VARDENAFIL HCL) take by mouth as needed for erectile dysfunction  #10 x 1   Entered and Authorized by:   Zara Council MD   Signed by:   Zara Council MD on 01/31/2010   Method used:   Electronically to        [phone][facility] (retail)       [address]       [place], [place]  [address]       Ph: [phone] or [phone]       Fax: [phone]   RxID:   [id number]

## 2011-03-06 LAB — CBC
Hemoglobin: 13.9 g/dL (ref 13.0–17.0)
MCHC: 33.9 g/dL (ref 30.0–36.0)
RBC: 4.46 MIL/uL (ref 4.22–5.81)
RDW: 13.6 % (ref 11.5–15.5)

## 2011-03-06 LAB — COMPREHENSIVE METABOLIC PANEL
Albumin: 3.8 g/dL (ref 3.5–5.2)
CO2: 26 mEq/L (ref 19–32)
Calcium: 9 mg/dL (ref 8.4–10.5)
GFR calc Af Amer: >60 mL/min (ref >60)
GFR calc non Af Amer: >60 mL/min (ref >60)
Glucose, Bld: 99 mg/dL (ref 70–99)
Potassium: 4.1 mEq/L (ref 3.5–5.1)
Sodium: 139 mEq/L (ref 135–145)
Total Bilirubin: 0.6 mg/dL (ref 0.3–1.2)

## 2011-03-06 LAB — PROTIME-INR
INR: 1.08 (ref 0.00–1.49)
Prothrombin Time: 13.9 seconds (ref 11.6–15.2)

## 2011-03-06 LAB — APTT: aPTT: 21 seconds — ABNORMAL LOW (ref 24–37)

## 2011-04-17 DIAGNOSIS — F319 Bipolar disorder, unspecified: Secondary | ICD-10-CM

## 2011-04-17 DIAGNOSIS — K7460 Unspecified cirrhosis of liver: Secondary | ICD-10-CM

## 2011-04-17 DIAGNOSIS — H409 Unspecified glaucoma: Secondary | ICD-10-CM

## 2011-04-17 DIAGNOSIS — M1990 Unspecified osteoarthritis, unspecified site: Secondary | ICD-10-CM

## 2011-04-17 DIAGNOSIS — M130 Polyarthritis, unspecified: Secondary | ICD-10-CM

## 2011-04-17 DIAGNOSIS — R21 Rash and other nonspecific skin eruption: Secondary | ICD-10-CM

## 2011-04-17 DIAGNOSIS — Z87891 Personal history of nicotine dependence: Secondary | ICD-10-CM

## 2011-04-17 DIAGNOSIS — F1010 Alcohol abuse, uncomplicated: Secondary | ICD-10-CM

## 2011-04-17 DIAGNOSIS — J209 Acute bronchitis, unspecified: Secondary | ICD-10-CM
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
## 2011-05-01 NOTE — Op Note (Signed)
NAMETORRES, HARDENBROOK              ACCOUNT NO.:  [id number]   MEDICAL RECORD NO.:  [id number]          PATIENT TYPE:  AMB   LOCATION:  SDS                          FACILITY:  [facility]   PHYSICIAN:  Sandria Bales. Ezzard Standing, M.D.  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   POSTOPERATIVE DIAGNOSIS:  Chronic fistula in ano, left lateral rectum at  the 4 o'clock position in lithotomy.   PROCEDURE:  Rigid sigmoidoscopy to 25 cm with fistulectomy.   SURGEON:  Sandria Bales. Ezzard Standing, M.D.   No first assistant.   ANESTHESIA:  General endotracheal.   ESTIMATED BLOOD LOSS:  75 mL.   DRAINS LEFT IN:  None.   INDICATION FOR PROCEDURE:  Mr. Mode is a 55 year old black male who I  saw in the hospital in June 2006 for a right buttock abscess.  When I  actually saw him, he pointed out a draining sinus from his left side of  his rectum which appeared to be an old chronic fistula in ano.  This has  not resolved.  He now comes for attempted fistulectomy.  He does have  hepatitis C but this is well-controlled on medication.  He is disabled  but otherwise seems to be doing pretty well.   Indications and potential complications of surgery were explained to the  patient.  Potential complications include, but are not limited to,  bleeding, infection, recurrence of the fistula, and incontinence of  flatus or stool.   OPERATIVE NOTE:  The patient completed a HalfLytely bowel prep at home.  He presented to the operating room and was placed in lithotomy position,  given 1 g of cefoxitin at the initiation of the procedure.   First I did a rigid sigmoidoscopy to 25 cm.  He was well-prepped.  I  cleaned out a little bit of stool in his rectum.  I saw no mucosal  lesion or inflammation in the distal sigmoid colon or rectum.     I then prepped his perineum and  anal canal with Betadine solution.  When he was in lithotomy position I identified a fistula which was about  2 cm from the anal verge at the 4 o'clock position.  I injected the  fistula tract with a milliliter of methylene blue and this went directly  into the rectum at the squamocolumnar junction where I saw the blue dye  drain.   I did put the fistula probe into the fistula and on this exposed blue-  stained chronic sinus mucosa.  I then excised this fistula tract and  sent it to pathology.  I thought I excised all the blue dye-stained  sinus tract I could find.  I did spend about 15 minutes with Bovie  electrocautery holding pressure for hemostasis.  This let this make the  wound look dry.  I used a piece of Gelfoam covered with 1% dibucaine  ointment on the wound.   The rectum was then dressed with 4  x 4's and tape.  The patient  tolerated the procedure well and was transported to recovery in good  condition.  He will be discharged home today, see me back in 1-2 weeks  for follow-up wound care.      Sandria Bales. Ezzard Standing, M.D.  Electronically Signed     DHN/MEDQ  D:  12/03/2007  T:  12/03/2007  Job:  [id number]   cc:   Judie Grieve, MD

## 2011-05-01 NOTE — Group Therapy Note (Signed)
PHYSICIAN REQUESTING CONSULT:  Dr. Lynita Lombard Bapu requested consultation in  regards to degenerative joint disease, osteoarthritis.   CHIEF COMPLAINT:  Patient's chief complaint is back pain of two years'  duration.   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  He did not submit any worker's comp claim.  He has not had any physical  therapy or epidural steroid injections.  He has seen an orthopedic  surgeon, Dr. Juliene Pina over at [facility], who ordered a CT  myelogram performed July 03, 2007, showing a broad based right  paracentral and foraminal disk protrusion L4-5.  He has mild  degenerative disease L3-4, L5-S1.  He apparently has seen another  Careers adviser on [employer] near [place], which may be [facility]urosurgery, but he does not recall any specific names.  Another CT  myelogram has been ordered.  He really does not want to go through it  because he states the last one hurt him a lot.   He has been on medications for pain and looking back at his chart  records as well as Echart, he has also had chronic abdominal pain.  He  has a history of alcohol abuse.  He does have a history of hepatitis C  and is on Ribavirin and Pegasys for that per Dr. Casimiro Needle.  He also has a  history of chronic knee pain.  However, knee x-rays performed February 01, 2006, showed only mild medial compartment narrowing and a nuclear  medicine bone scan performed July 21, 2007 just showed some minimal  increased uptake at the knees.   He had been on oxycodone 5 mg tablet q.i.d. in 2007.  His dosages have  increased over time.  He states he takes the medicine and it helps his  back pain, but also takes it for his abdominal pain, which is mainly  right upper quadrant.  He has been taking Colace to help with  constipation.  Currently he is on OxyContin 20 mg b.i.d. as well as  oxycodone 5 mg  daily.  In addition he takes Percocet, although we do not  have a dosage on this on a b.i.d. basis.  He did not bring any of his  bottles in for [place] to check.  He does have a pain contract with his PCP.   A CT of the abdomen and pelvis showed DJD in the hips with subchondral  cyst and narrowing of joint space.  There were arthritic changes in the  sacroiliac joints as well.  This was performed February 12, 2007.   He has also been treated for gluteal abscess per Dr. Ezzard Standing from  surgery.  This was in the right buttocks region.   His average pain is rated as 9/10, described as sharp, intermittent,  stabbing, constant, aching.  Interferes with activity completely.  Pain  improves with rest and mediation, but any type of activity or inactivity  seems to make it worse.  That is, there are no clear exacerbating  activities.  He drives, he climbs steps , he can walk 3 to 4 minutes at  a time.  He uses a cane.  He indicates that he was last employed  January 24, 2007 and stopped working because of his various pain  complaints.  He is independent with his ADLs, although some days he  skips a bath because he feels like his pain is  too bad to bathe.  He  does have some difficulty getting his shoes and socks on, but is able to  do this.  His household chores are problematic  and he does have what  sounds like a Lobbyist form a home health agency to assist him.   REVIEW OF SYSTEMS:  Positive for numbness in the lateral thigh on the  left , trouble walking, dizziness, confusion, depression, anxiety, but  negative for suicidal thoughts.  Positive for weight loss, blood sugar  problems and abdominal pain as well as shortness of breath.   He is married, but he lives with his two sons.  He denies any alcohol  abuse or drug abuse.   PAST MEDICAL HISTORY:  As noted above.  Also with hypertension and  glaucoma.   CURRENT MEDICATIONS:  1. Hydroxyzine 25 mg daily.  2. Oxycodone 5 mg daily.  3.  Colace 100 mg b.i.d.  4. OxyContin 20 mg b.i.d.  5. Cymbalta 20 mg daily.  6. Ambien 10 mg daily.  7. Mupirocin ointment.  History MRSA.  8. Hydrochlorothiazide 25 mg daily.  9. Lumigan once daily.  10.Nexium 40 mg daily.  11.Lovetra 20 mg daily.  12.Ribavirin 200 mg 6 tablets per day.  13.Pegasys 180 mg a day.   ALLERGIES:  None known.   GAIT:  He is using a cane but he can walk without a cane.  He is in a  forward flexed posture.  He has no evidence of toe drag or knee  instability.  His affect is alert and orientation x3.  His neck has full range of motion.  Has minimal tenderness in the  cervical perispinals and mild tenderness in the lumbar perispinal in the  lower lumbosacral area.  He has normal strength in the deltoid, biceps,  triceps, grip, as well as hip flexors, knee extensors.  The ankle  dorsiflexors initially felt weak, but with coaching he was giving a  better effort and he actually tested out normal.  He sates that ankle  dorsiflexion causes back pain but no shooting pain down the legs.  His  hip internal rotation is limited.  External rotation is also mildly  limited.  Faber's maneuver caused pain in bilateral hips as well as PSIS  area.  He has no evidence of knee effusion.  He has full knee extension  and flexion, and ankle range of motion as well.  Upper extremity range  of motion is normal.  His sensation is mildly reduced on the left  lateral thigh.  Deep tendon reflexes are normal bilateral upper and  lower extremities.  Extremities show no  evidence of edema.  Good  pulses.   He has normal tone.  No evidence of joint instability.   IMPRESSION:  1. Chronic low back and hip pain.  His CT of the lumbar spine showed a      foraminal protrusion mainly on the right side, but he really does      not have any predominant right sided symptoms.  His axial back pain      seems more in the PSIS area, may be sacroiliac in nature.  Also he      has significant hip  osteoarthritis and this is likely causing his      pain in the buttocks area.  2. Left thigh numbness, likely myalgia paresthetica, really no      explanation of this on the CT of the lumbar spine.  No L2-3  abnormalities of note.  3. Mild knee degenerative joint disease.  I believe that his knee      symptoms may be more related to his hips.  He has no evidence of      knee effusion, good range of motion and no tenderness along the      joint line.   RECOMMENDATIONS:  Prior to pursuing a spinal surgery, I would recommend  that he get orthopedic evaluation of his hips if this has not been  already done.   Patient states he was under the impression that  this was a surgical  clinic for back surgery.  I did explain to him that we do provide  nonoperative care for spinal problems and my recommendation prior to  surgical intervention would be physical therapy plus epidural steroid  injections.  However, he does not wish to consider that at this point  and has an appointment with the spine surgeon in the next 1 to 2 weeks.   In regards to his pain management, he would like to continue following  with his primary care physician to get his OxyContin.  I did check a  urine drug screen.  I will make the results available to his primary  physician, but at this point patient wishes to follow up with surgery  plus primary care.   Thank you for this interesting consultation.  Please call me with any  questions.      Erick Colace, M.D.  Electronically Signed     AEK/MedQ  D:  01/20/2008 12:07:42  T:  01/20/2008 18:08:16  Job #:  [id number]

## 2011-05-01 NOTE — Op Note (Signed)
Nathan Buchanan, Nathan [name]:  [id number]   MEDICAL RECORD NO.:  [id number]          PATIENT TYPE:  INP   LOCATION:  5009                         FACILITY:  [facility]   PHYSICIAN:  Sandria Bales. Ezzard Standing, M.D.  DATE OF BIRTH:  15-Jul-1956   DATE OF PROCEDURE:  05/23/2007  DATE OF DISCHARGE:                               OPERATIVE REPORT   PREOPERATIVE DIAGNOSIS:  Right buttocks abscess/cellulitis.   POSTOPERATIVE DIAGNOSIS:  More cellulitis of the right buttocks and  abscess.   PROCEDURE:  Incision and drainage of right buttocks with culture.   SURGEON:  Sandria Bales. Ezzard Standing, M.D.   ANESTHESIA:  Xylocaine 1% 20 mL.   INDICATIONS FOR PROCEDURE:  [name] is a 55 year old black male who  has had a 5-day history of right buttocks pain.  He is a patient of  medicine teaching service, is seeing Dr. Vanetta Mulders and was admitted  with a right buttocks pain/abscess.   I planned to incise and drained this at the bedside.   OPERATIVE NOTE:  The patient in the left lateral decubitus position, his  right buttocks prepped with Betadine solution and sterilely draped.  He  had an area of cellulitis covering 4-5 cm with a central area of  necrosis.  I infiltrated around this with 1% Xylocaine using about 20 mL  and then cut out about a 1.5 cm core in this area.   I obtained cultures, which I sent to microbiology.  I packed it with  sterile gauze and will start tomorrow with dressing changes 3 times a  day with saline gauze.      Sandria Bales. Ezzard Standing, M.D.  Electronically Signed     DHN/MEDQ  D:  05/23/2007  T:  05/24/2007  Job:  [id number]   cc:   Eliseo Gum, M.D.  Vanetta Mulders, MD
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
## 2011-05-04 NOTE — Op Note (Signed)
NAMECOSIMO, SCHERTZER              ACCOUNT NO.:  [id number]   MEDICAL RECORD NO.:  [id number]          PATIENT TYPE:  AMB   LOCATION:  ENDO                         FACILITY:  [facility]   PHYSICIAN:  James L. Malon Kindle., M.D.DATE OF BIRTH:  Nov 16, 1956   DATE OF PROCEDURE:  03/17/2007  DATE OF DISCHARGE:                               OPERATIVE REPORT   PROCEDURE:  Esophagogastroduodenoscopy.   MEDICATIONS:  Fentanyl 100 mcg, Versed 10 mg IV.   INDICATION:  Abdominal pain of unclear cause in a gentleman with known  hepatitis C, so he needed to be treated for this soon.   DESCRIPTION OF PROCEDURE:  The procedure explained to the patient,  consent obtained.  With the patient in the left lateral decubitus  position, the Olympus scope was inserted and advanced, and the distal  esophagus was reached.  There were no varices seen.  The scope was  passed into the stomach.  __________ identified and passed into the  duodenum, was treated __________ lesions.  The port channel, antrum, and  body were all normal, as was the fundus and cardia.  Again, the scope  was withdrawn.  The distal esophagus was normal.  No evidence of ulcers.  The scope was withdrawn.  The patient tolerated the procedure well.   ASSESSMENT:  Right upper quadrant pain with essentially normal endoscopy  at this time.   PLAN:  Will continue the patient on current medications and have him  follow up with Dr. Jacqualine Mau' plan for treatment of his hepatitis C.           ______________________________  Llana Aliment. Malon Kindle., M.D.     Waldron Session  D:  03/17/2007  T:  03/17/2007  Job:  [id number]   cc:   Judie Grieve, MD  Brooke Dare, MD

## 2011-05-04 NOTE — Op Note (Signed)
NAMEGOVIND, FUREY              ACCOUNT NO.:  [id number]   MEDICAL RECORD NO.:  [id number]          PATIENT TYPE:  AMB   LOCATION:  ENDO                         FACILITY:  [facility]   PHYSICIAN:  James L. Malon Kindle., M.D.DATE OF BIRTH:  [birth year]   DATE OF PROCEDURE:  03/20/2005  DATE OF DISCHARGE:                                 OPERATIVE REPORT   PROCEDURE:  Colonoscopy.   MEDICATIONS:  1.  Fentanyl 80 mcg.  2.  Versed 7 mg IV.   SCOPE:  Olympus pediatric adjustable colonoscope.   INDICATION:  Screening in a gentleman with occasional bright red blood per  rectum.  Family history of colon cancer in his father.   DESCRIPTION OF PROCEDURE:  Procedure had been explained and patient consent  obtained.  With the patient in the left lateral decubitus position, the  Olympus pediatric adjustable scope was inserted.  The patient had an  extremely long tortuous colon, multiple maneuvers, including position  changes and abdominal pressure, were required, eventually we were able to  reach the cecum, ileocecal valve was seen.  The scope was withdrawn and the  cecum, ascending colon, transverse colon, splenic flexure, descending and  sigmoid colon were seen well, no polyps were seen.  The scope was withdrawn,  the rectum was free of polyps.  Again the patient had a quite long tortuous  colon.  The scope was removed, the patient tolerated the procedure well,  there were no immediate complications.   ASSESSMENT:  1.  Normal colonoscopy __________ increased risk due to family history of      colon cancer (V16.0).  2.  Rectal bleeding probably due to internal hemorrhoids (78.1).   PLAN:  1.  Will recommend yearly hemoccults.  2.  Will give a hemorrhoid sheet.  3.  Will recommend repeating colonoscopy in 5 years due to his strong family      history of colon cancer.      JLE/MEDQ  D:  03/20/2005  T:  03/20/2005  Job:  [id number]   cc:   Clare Charon, M.D.  [address]  [place] [address]  Fax: [phone]
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
## 2011-09-14 LAB — AMMONIA: Ammonia: 22

## 2011-09-14 LAB — COMPREHENSIVE METABOLIC PANEL
ALT: 29
AST: 35
Albumin: 3.5
Calcium: 8.7
GFR calc Af Amer: >60
Sodium: 135
Total Protein: 7.7

## 2011-09-14 LAB — CBC
HCT: 40
Hemoglobin: 13.5
MCHC: 33.8
MCV: 99.2
Platelets: 231
RBC: 4.03 — ABNORMAL LOW
RDW: 12.6
WBC: 5.2

## 2011-09-14 LAB — DIFFERENTIAL
Basophils Absolute: 0
Basophils Relative: 1
Eosinophils Absolute: 0
Eosinophils Relative: 1
Lymphocytes Relative: 44
Lymphs Abs: 2.3
Monocytes Absolute: 0.7
Monocytes Relative: 14 — ABNORMAL HIGH
Neutro Abs: 2.1
Neutrophils Relative %: 41 — ABNORMAL LOW

## 2011-09-14 LAB — PROTIME-INR
INR: 1.1
Prothrombin Time: 14.2

## 2011-09-14 LAB — COMPREHENSIVE METABOLIC PANEL WITH GFR
Alkaline Phosphatase: 70
BUN: 7
CO2: 29
Chloride: 101
Creatinine, Ser: 0.81
GFR calc non Af Amer: >60
Glucose, Bld: 87
Potassium: 3.5
Total Bilirubin: 0.7

## 2011-09-21 LAB — CBC
HCT: 33.1 — ABNORMAL LOW
Hemoglobin: 11.4 — ABNORMAL LOW
MCHC: 34.5
MCV: 97.5
RBC: 3.4 — ABNORMAL LOW
RDW: 14.5

## 2011-09-21 LAB — COMPREHENSIVE METABOLIC PANEL
ALT: 37
BUN: 10
CO2: 26
Calcium: 8.6
Creatinine, Ser: 0.83
GFR calc non Af Amer: >60
Glucose, Bld: 95
Sodium: 135
Total Protein: 7.7

## 2011-09-21 LAB — DIFFERENTIAL
Eosinophils Absolute: 0 — ABNORMAL LOW
Lymphocytes Relative: 41
Lymphs Abs: 2.1
Monocytes Relative: 11
Neutro Abs: 2.4
Neutrophils Relative %: 47

## 2011-09-21 LAB — APTT: aPTT: 24

## 2011-09-21 LAB — PROTIME-INR
INR: 1.1
Prothrombin Time: 14.4

## 2011-09-25 LAB — COMPREHENSIVE METABOLIC PANEL
ALT: 38
AST: 79 — ABNORMAL HIGH
Alkaline Phosphatase: 89
CO2: 31
Calcium: 8.8
GFR calc Af Amer: >60
GFR calc non Af Amer: >60
Potassium: 4.1
Sodium: 137
Total Protein: 8.1

## 2011-09-25 LAB — CBC
Hemoglobin: 11.7 — ABNORMAL LOW
MCHC: 34
RBC: 3.58 — ABNORMAL LOW

## 2011-09-25 LAB — APTT: aPTT: 33

## 2011-09-25 LAB — DIFFERENTIAL
Eosinophils Relative: 0
Lymphs Abs: 2
Monocytes Absolute: 0.7
Monocytes Relative: 13 — ABNORMAL HIGH
Neutro Abs: 2.5

## 2011-10-04 LAB — CBC
HCT: 38.9 — ABNORMAL LOW
HCT: 41.4
Hemoglobin: 13.1
Hemoglobin: 13.4
Hemoglobin: 14.1
MCHC: 33.7
MCHC: 34.3
MCV: 87.5
RBC: 4.31
RBC: 4.44
RBC: 4.68
RDW: 13
RDW: 13.2
WBC: 15.1 — ABNORMAL HIGH

## 2011-10-04 LAB — BASIC METABOLIC PANEL
CO2: 28
CO2: 30
Calcium: 8.8
Calcium: 8.9
Chloride: 100
Creatinine, Ser: 0.92
Creatinine, Ser: 1
GFR calc Af Amer: >60
GFR calc Af Amer: >60
GFR calc non Af Amer: >60
Glucose, Bld: 87
Glucose, Bld: 99
Sodium: 134 — ABNORMAL LOW
Sodium: 134 — ABNORMAL LOW

## 2011-10-04 LAB — COMPREHENSIVE METABOLIC PANEL
Albumin: 3.6
Alkaline Phosphatase: 74
BUN: 19
CO2: 26
Chloride: 97
GFR calc non Af Amer: >60
Glucose, Bld: 100 — ABNORMAL HIGH
Potassium: 4
Total Bilirubin: 0.6

## 2011-10-04 LAB — CULTURE, BLOOD (ROUTINE X 2): Culture: NO GROWTH

## 2011-10-04 LAB — WOUND CULTURE

## 2011-10-04 LAB — DIFFERENTIAL
Basophils Absolute: 0.1
Basophils Relative: 1
Monocytes Absolute: 1.2 — ABNORMAL HIGH
Neutro Abs: 7.8 — ABNORMAL HIGH
Neutrophils Relative %: 52

## 2011-10-04 LAB — APTT: aPTT: 32

## 2012-10-22 ENCOUNTER — Emergency Department (HOSPITAL_COMMUNITY)
Admission: EM | Admit: 2012-10-22 | Discharge: 2012-10-22 | Disposition: A | Discharge status: Home / Self Care | Payer: Medicare Other | Attending: Emergency Medicine | Admitting: Emergency Medicine

## 2012-10-22 ENCOUNTER — Encounter (HOSPITAL_COMMUNITY): Payer: Self-pay | Admitting: Emergency Medicine

## 2012-10-22 DIAGNOSIS — F17200 Nicotine dependence, unspecified, uncomplicated: Secondary | ICD-10-CM | POA: Insufficient documentation

## 2012-10-22 DIAGNOSIS — Z8739 Personal history of other diseases of the musculoskeletal system and connective tissue: Secondary | ICD-10-CM | POA: Insufficient documentation

## 2012-10-22 DIAGNOSIS — S61209A Unspecified open wound of unspecified finger without damage to nail, initial encounter: Secondary | ICD-10-CM | POA: Insufficient documentation

## 2012-10-22 DIAGNOSIS — Z79899 Other long term (current) drug therapy: Secondary | ICD-10-CM | POA: Insufficient documentation

## 2012-10-22 DIAGNOSIS — Z23 Encounter for immunization: Secondary | ICD-10-CM | POA: Insufficient documentation

## 2012-10-22 DIAGNOSIS — Y9301 Activity, walking, marching and hiking: Secondary | ICD-10-CM | POA: Insufficient documentation

## 2012-10-22 DIAGNOSIS — W540XXA Bitten by dog, initial encounter: Secondary | ICD-10-CM | POA: Insufficient documentation

## 2012-10-22 DIAGNOSIS — Y92009 Unspecified place in unspecified non-institutional (private) residence as the place of occurrence of the external cause: Secondary | ICD-10-CM | POA: Insufficient documentation

## 2012-10-22 DIAGNOSIS — I10 Essential (primary) hypertension: Secondary | ICD-10-CM | POA: Insufficient documentation

## 2012-10-22 HISTORY — DX: Unspecified osteoarthritis, unspecified site: M19.90

## 2012-10-22 HISTORY — DX: Essential (primary) hypertension: I10

## 2012-10-22 HISTORY — DX: Dorsalgia, unspecified: M54.9

## 2012-10-22 MED ORDER — IBUPROFEN 800 MG PO TABS
800.0000 mg | ORAL_TABLET | Freq: Once | ORAL | Status: AC
  Administered 2012-10-22: 800 mg via ORAL
  Filled 2012-10-22: qty 1

## 2012-10-22 MED ORDER — LIDOCAINE HCL 2 % IJ SOLN
2.0000 mL | Freq: Once | INTRAMUSCULAR | Status: AC
  Administered 2012-10-22: 40 mg

## 2012-10-22 MED ORDER — RABIES IMMUNE GLOBULIN 150 UNIT/ML IM INJ
20.0000 [IU]/kg | INJECTION | Freq: Once | INTRAMUSCULAR | Status: AC
  Administered 2012-10-22: 1725 [IU] via INTRAMUSCULAR
  Filled 2012-10-22: qty 11.5

## 2012-10-22 MED ORDER — AMOXICILLIN-POT CLAVULANATE 875-125 MG PO TABS: 1.0000 | ORAL_TABLET | Freq: Two times a day (BID) | ORAL | Status: DC

## 2012-10-22 MED ORDER — RABIES VACCINE, PCEC IM SUSR
1.0000 mL | Freq: Once | INTRAMUSCULAR | Status: AC
  Administered 2012-10-22: 1 mL via INTRAMUSCULAR
  Filled 2012-10-22: qty 1

## 2012-10-22 MED ORDER — TETANUS-DIPHTH-ACELL PERTUSSIS 5-2.5-18.5 LF-MCG/0.5 IM SUSP
0.5000 mL | Freq: Once | INTRAMUSCULAR | Status: AC
  Administered 2012-10-22: 0.5 mL via INTRAMUSCULAR
  Filled 2012-10-22: qty 0.5

## 2012-10-22 MED ORDER — HYDROCODONE-ACETAMINOPHEN 5-500 MG PO TABS: 1.0000 | ORAL_TABLET | Freq: Four times a day (QID) | ORAL | Status: DC | PRN

## 2012-10-22 NOTE — ED Provider Notes (Signed)
History     CSN: [id number]  Arrival date & time 10/22/12  1221   First MD Initiated Contact with Patient 10/22/12 1316      Chief Complaint  Patient presents with  . Extremity Laceration    4th fingerr/hand -2.5 cm partcial thickness laceration due to dog bite  . Animal Bite    (Consider location/radiation/quality/duration/timing/severity/associated sxs/prior treatment) HPI  Patient presents to the emergency department with complaints of laceration to the ring finger on his right hand. The injury happened a couple hours prior to arrival. He says that a baby pitbull was walking in his yard with the collar on so he reached down to try c-collar said in the pit bull bit him and then ran away. He did not have a chance to see who would animal blonde to or if it was vaccinated against rabies. If bleeding is controlled at this time he came to see if he needed any treatment.  Past Medical History  Diagnosis Date  . Hypertension   . Arthritis   . Back ache     History reviewed. No pertinent past surgical history.  Family History  Problem Relation Age of Onset  . Hypertension Mother   . Stroke Father     History  Substance Use Topics  . Smoking status: Current Some Day Smoker  . Smokeless tobacco: Not on file  . Alcohol Use: Yes      Review of Systems  Review of Systems  Gen: no weight loss, fevers, chills, night sweats  Eyes: no discharge or drainage, no occular pain or visual changes  Neck: no neck pain  Lungs:No wheezing, coughing or hemoptysis CV: no chest pain, palpitations, dependent edema or orthopnea  Abd: no abdominal pain, nausea, vomiting  GU: no dysuria or gross hematuria  MSK:  Right hand 4th ring finger Neuro: no headache, no focal neurologic deficits  Skin: no abnormalities Psyche: negative.   Allergies  Review of patient's allergies indicates no known allergies.  Home Medications   Current Outpatient Rx  Name  Route  Sig  Dispense  Refill  .  BIMATOPROST 0.03 % OP SOLN   Both Eyes   Place 1 drop into both eyes daily.          Marland Kitchen HYDROCHLOROTHIAZIDE 25 MG PO TABS   Oral   Take 25 mg by mouth daily. For blood pressure         . ZOLPIDEM TARTRATE 10 MG PO TABS   Oral   Take 10 mg by mouth at bedtime as needed. For sleep         . AMOXICILLIN-POT CLAVULANATE 875-125 MG PO TABS   Oral   Take 1 tablet by mouth every 12 (twelve) hours.   14 tablet   0   . OXYCODONE HCL 10 MG PO TABS   Oral   Take 1 tablet by mouth every 6 (six) hours as needed. For pain           BP 137/92  Pulse 81  Temp 97.7 F (36.5 C) (Oral)  Resp 18  Wt 186 lb (84.369 kg)  SpO2 96%  Physical Exam  Nursing note and vitals reviewed. Constitutional: He appears well-developed and well-nourished. No distress.  HENT:  Head: Normocephalic and atraumatic.  Eyes: Pupils are equal, round, and reactive to light.  Neck: Normal range of motion. Neck supple.  Cardiovascular: Normal rate and regular rhythm.   Pulmonary/Chest: Effort normal.  Abdominal: Soft.  Musculoskeletal:  Left hand: He exhibits laceration (2 cm laceration to anterior 4th ring finger on right hand). He exhibits normal range of motion, no tenderness, no bony tenderness, normal two-point discrimination, normal capillary refill, no deformity and no swelling.  Neurological: He is alert.  Skin: Skin is warm and dry.    ED Course  Wound repair Performed by: Dorthula Matas Authorized by: Dorthula Matas Consent: Verbal consent obtained. Risks and benefits: risks, benefits and alternatives were discussed Consent given by: patient Comments: Pt given digital block to right 4th digit so that he can tolerate rabies infiltration into wound   (including critical care time)  Labs Reviewed - No data to display No results found.   1. Dog bite       MDM  Wounds soaked in normal saline and Betadine Rabies vaccination and immunoglobulin given Tetanus shot  given Patient prescribed Augmentin for prophylaxis *Turned to the emergency department as soon as possible if it becomes red, swollen, painful, decreased range of motion Pt has been advised of the symptoms that warrant their return to the ED. Patient has voiced understanding and has agreed to follow-up with the PCP or specialist.         Dorthula Matas, PA 10/22/12 1413  Dorthula Matas, PA 10/22/12 1424

## 2012-10-22 NOTE — ED Notes (Signed)
Laceration on 4th finger r/hand. Bleeding controlled. 2.5 cm partial thickness wound. Pt stated that he was bitten by a "pit bull" in his back yard. Animal ins unknown to pt

## 2012-10-25 NOTE — ED Provider Notes (Signed)
Medical screening examination/treatment/procedure(s) were performed by non-physician practitioner and as supervising physician I was immediately available for consultation/collaboration.   Laray Anger, DO 10/25/12 2006

## 2013-03-23 ENCOUNTER — Other Ambulatory Visit: Payer: Self-pay | Admitting: *Deleted

## 2013-03-25 ENCOUNTER — Other Ambulatory Visit: Payer: Self-pay | Admitting: *Deleted

## 2013-03-25 ENCOUNTER — Encounter: Payer: Self-pay | Admitting: Internal Medicine

## 2013-03-25 ENCOUNTER — Ambulatory Visit (INDEPENDENT_AMBULATORY_CARE_PROVIDER_SITE_OTHER): Payer: Medicare Other | Admitting: Internal Medicine

## 2013-03-25 VITALS — BP 160/98 | HR 96 | Temp 97.7°F | Resp 15 | Ht 71.0 in | Wt 198.2 lb

## 2013-03-25 DIAGNOSIS — M545 Low back pain: Secondary | ICD-10-CM

## 2013-03-25 DIAGNOSIS — L259 Unspecified contact dermatitis, unspecified cause: Secondary | ICD-10-CM

## 2013-03-25 DIAGNOSIS — L309 Dermatitis, unspecified: Secondary | ICD-10-CM

## 2013-03-25 DIAGNOSIS — I10 Essential (primary) hypertension: Secondary | ICD-10-CM

## 2013-03-25 MED ORDER — HYDROCHLOROTHIAZIDE 25 MG PO TABS: 25.0000 mg | ORAL_TABLET | Freq: Every day | ORAL | Status: DC

## 2013-03-25 MED ORDER — OXYCODONE HCL ER 15 MG PO T12A: EXTENDED_RELEASE_TABLET | ORAL | Status: DC

## 2013-03-25 MED ORDER — OXYCODONE HCL 10 MG PO TABS: ORAL_TABLET | ORAL | Status: DC

## 2013-03-25 MED ORDER — HYDROXYZINE HCL 10 MG PO TABS: 10.0000 mg | ORAL_TABLET | Freq: Three times a day (TID) | ORAL | Status: AC | PRN

## 2013-03-25 MED ORDER — HYDROCORTISONE 1 % EX OINT: TOPICAL_OINTMENT | Freq: Two times a day (BID) | CUTANEOUS | Status: DC

## 2013-03-25 NOTE — Assessment & Plan Note (Signed)
Seen by neurosurgery and conservative management is the plan as per pt, no notes for review. Will continue oxycodone 15 mg q6h prn for pain. Refill provided. Back injury precautions to be taken

## 2013-03-25 NOTE — Assessment & Plan Note (Signed)
Elevated bp- non compliant with medication. Pt encouraged to take his medication. Agrees to. Warning signs explained. Refill on hctz provided

## 2013-03-25 NOTE — Assessment & Plan Note (Addendum)
Will have him on hydrocortisone cream bid for two week and hydroxyzine q8h prn for itching and reassess in 2 weeks.

## 2013-03-25 NOTE — Progress Notes (Signed)
  Subjective:    Patient ID: Nathan Buchanan, male    DOB: 1956/04/03, 57 y.o.   MRN: [id number]  Chief Complaint  Patient presents with  . Rash   HPI  Rash on his right leg for few months and has been worsening. It itches and he has been using his girlfriend's baby's cream for this. It has helped subside his itching some but comes back right again Denies any rash elsewhere Non complaint with his blood pressure medication. bp elevated in office today. Denies headache. Chest pain or sob. No problem with vision. Has not taken bp medication today and yesterday Back pain persists but better controlled with current pain regimen He mentions being under stress with relation with girlfriend and her kid at present  Review of Systems  Constitutional: Negative for fever, appetite change and fatigue.  Eyes: Negative.   Respiratory: Negative for chest tightness and shortness of breath.   Cardiovascular: Negative for chest pain, palpitations and leg swelling.  Gastrointestinal: Negative for abdominal pain.  Genitourinary: Negative for dysuria.  Musculoskeletal: Positive for back pain.  Skin: Positive for rash.  Neurological: Negative for dizziness and weakness.  Hematological: Negative for adenopathy.       Objective:   Physical Exam  Constitutional: He is oriented to person, place, and time. He appears well-developed and well-nourished. No distress.  HENT:  Head: Normocephalic and atraumatic.  Mouth/Throat: Oropharynx is clear and moist.  Eyes: Pupils are equal, round, and reactive to light.  Neck: Normal range of motion. Neck supple. No thyromegaly present.  Cardiovascular: Normal rate and regular rhythm.   Pulmonary/Chest: Effort normal and breath sounds normal.  Abdominal: Soft. He exhibits no distension.  Musculoskeletal: Normal range of motion. He exhibits no edema.  Neurological: He is alert and oriented to person, place, and time.  Skin: Skin is warm and dry. Rash noted. He is not  diaphoretic. There is erythema.  Dry and raised areas with thickened border, erythema present, non tender, some excorciation mark with bleed  Psychiatric: He has a normal mood and affect. His behavior is normal.   BP 160/98  Pulse 96  Temp(Src) 97.7 F (36.5 C) (Oral)  Resp 15  Ht 5\' 11"  (1.803 m)  Wt 198 lb 3.2 oz (89.903 kg)  BMI 27.66 kg/m2     Assessment & Plan:   HYPERTENSION Elevated bp- non compliant with medication. Pt encouraged to take his medication. Agrees to. Warning signs explained. Refill on hctz provided  Eczema Will have him on hydrocortisone cream bid for two week and hydroxyzine q8h prn for itching and reassess in 2 weeks.   LOW BACK PAIN, CHRONIC Seen by neurosurgery and conservative management is the plan as per pt, no notes for review. Will continue oxycodone 15 mg q6h prn for pain. Refill provided. Back injury precautions to be taken

## 2013-03-31 ENCOUNTER — Ambulatory Visit (INDEPENDENT_AMBULATORY_CARE_PROVIDER_SITE_OTHER): Payer: Medicare Other | Admitting: Internal Medicine

## 2013-03-31 ENCOUNTER — Encounter: Payer: Self-pay | Admitting: Internal Medicine

## 2013-03-31 VITALS — BP 140/88 | HR 96 | Temp 98.1°F | Resp 15 | Ht 71.0 in | Wt 193.4 lb

## 2013-03-31 DIAGNOSIS — L282 Other prurigo: Secondary | ICD-10-CM

## 2013-03-31 DIAGNOSIS — B354 Tinea corporis: Secondary | ICD-10-CM

## 2013-03-31 DIAGNOSIS — I10 Essential (primary) hypertension: Secondary | ICD-10-CM

## 2013-03-31 MED ORDER — CLOTRIMAZOLE-BETAMETHASONE 1-0.05 % EX CREA: 1.0000 "application " | TOPICAL_CREAM | Freq: Two times a day (BID) | CUTANEOUS | Status: DC

## 2013-03-31 MED ORDER — FLUCONAZOLE 150 MG PO TABS: 150.0000 mg | ORAL_TABLET | Freq: Once | ORAL | Status: AC

## 2013-03-31 NOTE — Progress Notes (Signed)
Patient ID: Nathan Buchanan, male   DOB: [birth year], 57 y.o.   MRN: [id number]  HPI  Rash on his right leg for few months and has been worsening. He was put on hydrocortisone cream with hydroxyzine 2 weeks back with no imporvement. It itches and he has been using his girlfriend's baby's cream for this. Denies any rash elsewhere bp better controlled this visit. He has been compliant with his medications  Review of Systems  Constitutional: Negative for fever, appetite change and fatigue.  Eyes: Negative.   Respiratory: Negative for chest tightness and shortness of breath.   Cardiovascular: Negative for chest pain, palpitations and leg swelling.  Gastrointestinal: Negative for abdominal pain.  Genitourinary: Negative for dysuria.  Musculoskeletal: Positive for back pain.  Skin: Positive for rash.  Neurological: Negative for dizziness and weakness.  Hematological: Negative for adenopathy.        Objective:    Physical Exam  Constitutional: He is oriented to person, place, and time. He appears well-developed and well-nourished. No distress.  HENT:   Head: Normocephalic and atraumatic.   Mouth/Throat: Oropharynx is clear and moist.  Eyes: Pupils are equal, round, and reactive to light.  Neck: Normal range of motion. Neck supple. No thyromegaly present.  Cardiovascular: Normal rate and regular rhythm.   Pulmonary/Chest: Effort normal and breath sounds normal.  Abdominal: Soft. He exhibits no distension.  Musculoskeletal: Normal range of motion. He exhibits no edema.  Neurological: He is alert and oriented to person, place, and time.  Skin: Skin is warm and dry. Rash noted. He is not diaphoretic. There is erythema.  Dry and raised areas with thickened border, erythema present, non tender, some excorciation mark, no bleed Psychiatric: He has a normal mood and affect. His behavior is normal.   BP 140/88  Pulse 96  Temp(Src) 98.1 F (36.7 C) (Oral)  Resp 15  Ht 5\' 11"  (1.803 m)  Wt  193 lb 6.4 oz (87.726 kg)  BMI 26.99 kg/m2  ASSESSMENT/PLAN-  Tinea corporis- concerns for fungsl infection on top of eczema at present. Will provide fluconazole 150 mg daily x 3 days with lortisone cream in affected area bid for a week and reassess if no improvement. Will also write for prednisone 20 mg daily for 5 days to help subside the pruritis and flare up. Hydroxyzine prn for itching  htn- blood pressure better controlled than last visit. Continue current regimen of hctz and monitor

## 2013-04-08 ENCOUNTER — Ambulatory Visit: Payer: Medicare Other | Admitting: Internal Medicine

## 2013-04-23 ENCOUNTER — Other Ambulatory Visit: Payer: Self-pay | Admitting: Geriatric Medicine

## 2013-04-23 MED ORDER — OXYCODONE HCL ER 15 MG PO T12A: EXTENDED_RELEASE_TABLET | ORAL | Status: DC

## 2013-04-24 ENCOUNTER — Other Ambulatory Visit: Payer: Self-pay | Admitting: *Deleted

## 2013-04-24 ENCOUNTER — Other Ambulatory Visit: Payer: Self-pay | Admitting: Internal Medicine

## 2013-05-22 ENCOUNTER — Other Ambulatory Visit: Payer: Self-pay | Admitting: *Deleted

## 2013-05-22 MED ORDER — OXYCODONE HCL 15 MG PO TABS: ORAL_TABLET | ORAL | Status: DC

## 2013-05-22 MED ORDER — OXYCODONE HCL ER 15 MG PO T12A: EXTENDED_RELEASE_TABLET | ORAL | Status: DC

## 2013-06-15 ENCOUNTER — Other Ambulatory Visit: Payer: Self-pay | Admitting: Geriatric Medicine

## 2013-06-18 ENCOUNTER — Other Ambulatory Visit: Payer: Self-pay | Admitting: Geriatric Medicine

## 2013-06-18 MED ORDER — OXYCODONE HCL 15 MG PO TABS: ORAL_TABLET | ORAL | Status: DC

## 2013-06-22 ENCOUNTER — Encounter: Payer: Self-pay | Admitting: *Deleted

## 2013-06-22 ENCOUNTER — Other Ambulatory Visit: Payer: Self-pay | Admitting: *Deleted

## 2013-06-23 ENCOUNTER — Ambulatory Visit: Payer: Self-pay | Admitting: Internal Medicine

## 2013-06-23 DIAGNOSIS — Z0289 Encounter for other administrative examinations: Secondary | ICD-10-CM

## 2013-07-01 ENCOUNTER — Ambulatory Visit (INDEPENDENT_AMBULATORY_CARE_PROVIDER_SITE_OTHER): Payer: Medicare Other | Admitting: Internal Medicine

## 2013-07-01 ENCOUNTER — Encounter: Payer: Self-pay | Admitting: Internal Medicine

## 2013-07-01 VITALS — BP 110/80 | HR 80 | Temp 97.4°F | Resp 18 | Ht 67.0 in | Wt 201.6 lb

## 2013-07-01 DIAGNOSIS — I10 Essential (primary) hypertension: Secondary | ICD-10-CM

## 2013-07-01 DIAGNOSIS — M545 Low back pain, unspecified: Secondary | ICD-10-CM

## 2013-07-01 DIAGNOSIS — G4700 Insomnia, unspecified: Secondary | ICD-10-CM

## 2013-07-01 NOTE — Progress Notes (Signed)
Patient ID: Nathan Buchanan, male   DOB: Mar 02, 1956, 57 y.o.   MRN: [id number]  Chief Complaint  Patient presents with  . Follow-up    rash   No Known Allergies  HPI- 57 y/o male patient is here for routine follow up. He denies any complaints this visit. His appetite is good. His pain is under cntrol. He is compliant with his medications  Review of Systems  Constitutional: Negative for fever, chills, malaise/fatigue and diaphoresis.  HENT: Negative for congestion and tinnitus.   Eyes: Negative for blurred vision.  Respiratory: Negative for cough and shortness of breath.   Cardiovascular: Negative for chest pain, palpitations and orthopnea.  Gastrointestinal: Negative for heartburn, nausea, vomiting and abdominal pain.  Genitourinary: Negative for dysuria and urgency.  Musculoskeletal: Negative for myalgias and falls.  Skin: Negative for itching and rash.  Neurological: Negative for dizziness, weakness and headaches.  Psychiatric/Behavioral: Negative for depression.   Past Medical History  Diagnosis Date  . Hypertension   . Arthritis   . Back ache   . Urinary frequency   . Generalized osteoarthrosis, involving multiple sites   . Dermatophytosis of the body   . Chronic hepatitis C without mention of hepatic coma   . Bipolar I disorder, most recent episode (or current) unspecified   . Unspecified glaucoma(365.9)   . Cirrhosis of liver without mention of alcohol   . Osteoarthrosis, unspecified whether generalized or localized, unspecified site    BP 110/80  Pulse 80  Temp(Src) 97.4 F (36.3 C) (Oral)  Resp 18  Ht 5\' 7"  (1.702 m)  Wt 201 lb 9.6 oz (91.445 kg)  BMI 31.57 kg/m2  SpO2 98%  Constitutional: He is oriented to person, place, and time. He appears well-developed and well-nourished. No distress.  HENT:   Head: Normocephalic and atraumatic.   Mouth/Throat: Oropharynx is clear and moist.  Eyes: Pupils are equal, round, and reactive to light.  Neck: Normal range of  motion. Neck supple. No thyromegaly present.  Cardiovascular: Normal rate and regular rhythm.   Pulmonary/Chest: Effort normal and breath sounds normal.  Abdominal: Soft. He exhibits no distension.  Musculoskeletal: Normal range of motion. He exhibits no edema.  Neurological: He is alert and oriented to person, place, and time.  Skin: Skin is warm and dry. Rash resolved Psychiatric: He has a normal mood and affect. His behavior is normal.   ASSESSMENT/PLAN  HYPERTENSION BP well controlled this visit. Continue current regimen of HCTZ. Monitor bmp next visit check  LOW BACK PAIN, CHRONIC Seen by neurosurgery and conservative management is the plan for now.Will continue oxycodone 15 mg q6h prn for pain. Back injury precautions to be taken  Eczema Continue lotrisone cream for now. Much improved from before  Labs- cbc, cmp, flp next visit

## 2013-07-20 ENCOUNTER — Other Ambulatory Visit: Payer: Self-pay | Admitting: Geriatric Medicine

## 2013-07-21 ENCOUNTER — Other Ambulatory Visit: Payer: Self-pay | Admitting: Geriatric Medicine

## 2013-07-21 MED ORDER — OXYCODONE HCL 15 MG PO TABS: ORAL_TABLET | ORAL | Status: DC

## 2013-08-21 ENCOUNTER — Other Ambulatory Visit: Payer: Self-pay

## 2013-08-21 MED ORDER — OXYCODONE HCL 15 MG PO TABS: ORAL_TABLET | ORAL | Status: DC

## 2013-08-21 NOTE — Telephone Encounter (Signed)
Patient walked in to pick-up rx, last filled 07/21/2013, last OV 07/01/2013

## 2013-09-11 ENCOUNTER — Other Ambulatory Visit: Payer: Self-pay

## 2013-09-11 MED ORDER — CLOTRIMAZOLE-BETAMETHASONE 1-0.05 % EX CREA: TOPICAL_CREAM | CUTANEOUS | Status: DC

## 2013-09-11 NOTE — Telephone Encounter (Signed)
RX sent electronically 

## 2013-09-18 ENCOUNTER — Other Ambulatory Visit: Payer: Self-pay | Admitting: *Deleted

## 2013-09-18 MED ORDER — OXYCODONE HCL 15 MG PO TABS: ORAL_TABLET | ORAL | Status: DC

## 2013-10-20 ENCOUNTER — Other Ambulatory Visit: Payer: Self-pay

## 2013-10-20 MED ORDER — OXYCODONE HCL 15 MG PO TABS: ORAL_TABLET | ORAL | Status: DC

## 2013-10-20 NOTE — Telephone Encounter (Signed)
Patient called in, spoke with Jasmine December L. Rice/RMA. Patient was told ok to come pick-up rx.

## 2013-10-30 ENCOUNTER — Other Ambulatory Visit: Payer: Self-pay | Admitting: *Deleted

## 2013-10-30 ENCOUNTER — Other Ambulatory Visit: Payer: Medicare Other

## 2013-10-30 DIAGNOSIS — I10 Essential (primary) hypertension: Secondary | ICD-10-CM

## 2013-10-30 DIAGNOSIS — K7460 Unspecified cirrhosis of liver: Secondary | ICD-10-CM

## 2013-10-30 DIAGNOSIS — B182 Chronic viral hepatitis C: Secondary | ICD-10-CM

## 2013-10-31 LAB — LIPID PANEL
Cholesterol, Total: 192 mg/dL (ref 100–199)
HDL: 31 mg/dL — ABNORMAL LOW (ref >39)
VLDL Cholesterol Cal: 53 mg/dL — ABNORMAL HIGH (ref 5–40)

## 2013-10-31 LAB — CBC WITH DIFFERENTIAL/PLATELET
Basos: 1 %
Eos: 1 %
Eosinophils Absolute: 0.1 10*3/uL (ref 0.0–0.4)
HCT: 42.7 % (ref 37.5–51.0)
Immature Grans (Abs): 0 10*3/uL (ref 0.0–0.1)
Lymphocytes Absolute: 8 10*3/uL — ABNORMAL HIGH (ref 0.7–3.1)
MCH: 31.1 pg (ref 26.6–33.0)
MCV: 90 fL (ref 79–97)
Monocytes Absolute: 0.9 10*3/uL (ref 0.1–0.9)
Neutrophils Relative %: 12 %
RBC: 4.76 x10E6/uL (ref 4.14–5.80)
RDW: 14.3 % (ref 12.3–15.4)

## 2013-10-31 LAB — COMPREHENSIVE METABOLIC PANEL
ALT: 30 IU/L (ref 0–44)
AST: 34 IU/L (ref 0–40)
Calcium: 9 mg/dL (ref 8.7–10.2)
Chloride: 106 mmol/L (ref 97–108)
Glucose: 93 mg/dL (ref 65–99)
Potassium: 4.1 mmol/L (ref 3.5–5.2)
Sodium: 141 mmol/L (ref 134–144)
Total Protein: 7.9 g/dL (ref 6.0–8.5)

## 2013-11-03 ENCOUNTER — Encounter: Payer: Medicare Other | Admitting: Internal Medicine

## 2013-11-11 ENCOUNTER — Encounter: Payer: Medicare Other | Admitting: Internal Medicine

## 2013-11-16 ENCOUNTER — Other Ambulatory Visit: Payer: Self-pay | Admitting: Internal Medicine

## 2013-11-18 ENCOUNTER — Other Ambulatory Visit: Payer: Self-pay | Admitting: *Deleted

## 2013-11-18 MED ORDER — OXYCODONE HCL 15 MG PO TABS: ORAL_TABLET | ORAL | Status: DC

## 2013-12-15 ENCOUNTER — Encounter: Payer: Medicare Other | Admitting: Internal Medicine

## 2013-12-18 ENCOUNTER — Other Ambulatory Visit: Payer: Self-pay | Admitting: *Deleted

## 2013-12-18 MED ORDER — OXYCODONE HCL 15 MG PO TABS: ORAL_TABLET | ORAL | Status: DC

## 2013-12-18 NOTE — Telephone Encounter (Signed)
rx reprinted in Dr Green's name 

## 2014-01-06 ENCOUNTER — Ambulatory Visit (INDEPENDENT_AMBULATORY_CARE_PROVIDER_SITE_OTHER): Payer: Medicare Other | Admitting: Internal Medicine

## 2014-01-06 ENCOUNTER — Encounter: Payer: Self-pay | Admitting: Internal Medicine

## 2014-01-06 VITALS — BP 140/98 | HR 70 | Resp 10 | Ht 71.0 in | Wt 192.0 lb

## 2014-01-06 DIAGNOSIS — I444 Left anterior fascicular block: Secondary | ICD-10-CM | POA: Insufficient documentation

## 2014-01-06 DIAGNOSIS — I10 Essential (primary) hypertension: Secondary | ICD-10-CM

## 2014-01-06 DIAGNOSIS — Z0000 Encounter for general adult medical examination without abnormal findings: Secondary | ICD-10-CM | POA: Insufficient documentation

## 2014-01-06 DIAGNOSIS — K219 Gastro-esophageal reflux disease without esophagitis: Secondary | ICD-10-CM

## 2014-01-06 DIAGNOSIS — E785 Hyperlipidemia, unspecified: Secondary | ICD-10-CM | POA: Insufficient documentation

## 2014-01-06 DIAGNOSIS — F1910 Other psychoactive substance abuse, uncomplicated: Secondary | ICD-10-CM

## 2014-01-06 DIAGNOSIS — H409 Unspecified glaucoma: Secondary | ICD-10-CM

## 2014-01-06 DIAGNOSIS — I4460 Unspecified fascicular block: Secondary | ICD-10-CM

## 2014-01-06 DIAGNOSIS — M545 Low back pain, unspecified: Secondary | ICD-10-CM

## 2014-01-06 MED ORDER — HYDROCHLOROTHIAZIDE 50 MG PO TABS: ORAL_TABLET | ORAL | Status: DC

## 2014-01-06 MED ORDER — SIMVASTATIN 10 MG PO TABS: 10.0000 mg | ORAL_TABLET | Freq: Every day | ORAL | Status: DC

## 2014-01-06 NOTE — Progress Notes (Signed)
Patient ID: Nathan Buchanan, male   DOB: 1956/02/24, 58 y.o.   MRN: [id number]    CC- physical   No Known Allergies  HPI- 58 y/o male patient is here for his physical exam. outine follow up. He denies any complaints this visit. His appetite is good. His pain is under cntrol. He is compliant with his medications. He smokes weed at times. Denies smoking cigarettes. Denies alcohol intake. No recent falls reported. His blood pressure is elevated in office. He was running late and has not taken his medication this am.  Immunization History  Administered Date(s) Administered  . Influenza Whole 10/17/2006  . Influenza-Unspecified 08/21/2013  . Rabies, IM 10/22/2012  . Rabies, intradermal 10/22/2012  . Tdap 10/22/2012   uptodate with colonoscopy - may be 5-6 years back and pt mentions that the test was normal- [facility]  Review of Systems  Constitutional: Negative for fever, chills, malaise/fatigue and diaphoresis. Has lost some weight. Appetite is good HENT: Negative for congestion and tinnitus. no hearing loss Eyes: Negative for blurred vision. Wears eye glasses. Last saw eye doctor in 9/14 Respiratory: Negative for cough and shortness of breath.   Cardiovascular: Negative for chest pain, palpitations and orthopnea. Denies leg swelling Gastrointestinal: Negative for heartburn, nausea, vomiting and abdominal pain. Denies constipation or diarrhea Genitourinary: Negative for dysuria, frequency, hematuria and urgency.  Musculoskeletal: Negative for myalgias and falls. Has chronic back pain Skin: Negative for itching and rash. Previous rash have resolved Neurological: Negative for dizziness, weakness and headaches. No falls reported Psychiatric/Behavioral: Negative for depression. Negative for insomnia  Past Medical History  Diagnosis Date  . Hypertension   . Arthritis   . Back ache   . Urinary frequency   . Generalized osteoarthrosis, involving multiple sites   .  Dermatophytosis of the body   . Chronic hepatitis C without mention of hepatic coma   . Bipolar I disorder, most recent episode (or current) unspecified   . Unspecified glaucoma   . Cirrhosis of liver without mention of alcohol   . Osteoarthrosis, unspecified whether generalized or localized, unspecified site    Past Surgical History  Procedure Laterality Date  . Penile prosthesis implant  04/12/2010    Dr.Grapey, Insertion of 3 peice penile prosthesis   . Anal fissure repair  2008  . Abcess drainage      Gluteal MRSA drainage   . Colonoscopy  2008    Dr.Edwards, Internal Hemorrhoids    Current Outpatient Prescriptions on File Prior to Visit  Medication Sig Dispense Refill  . bimatoprost (LUMIGAN) 0.03 % ophthalmic drops Place 1 drop into both eyes daily.       . clotrimazole-betamethasone (LOTRISONE) cream apply to affected area twice a day  45 g  1  . hydrochlorothiazide (HYDRODIURIL) 25 MG tablet take 1 tablet by mouth once daily for blood pressure  30 tablet  3  . oxyCODONE (ROXICODONE) 15 MG immediate release tablet Take one tablet by mouth 6 hours as needed for pain  120 tablet  0   No current facility-administered medications on file prior to visit.   Physical exam BP 140/98  Pulse 70  Resp 10  Ht 5\' 11"  (1.803 m)  Wt 192 lb (87.091 kg)  BMI 26.79 kg/m2  SpO2 98%  General- adult male in no acute distress, thin built Head- atraumatic, normocephalic Eyes- PERRLA, EOMI, no pallor, no icterus, no discharge Neck- no lymphadenopathy, no thyromegaly, no jugular vein distension, no carotid bruit Ears- left ear  normal tympanic membrane and normal external ear canal , right ear normal tympanic membrane and normal external ear canal Chest- no chest wall deformities, no chest wall tenderness Cardiovascular- normal s1,s2, no murmurs/ rubs/ gallops Respiratory- bilateral clear to auscultation, no wheeze, no rhonchi, no crackles Abdomen- bowel sounds present, soft, non tender, no  organomegaly, no abdominal bruits, no guarding or rigidity, no CVA tenderness Musculoskeletal- able to move all 4 extremities, lumbar paraspinal tenderness on exam, occassionally uses a cane Neurological- no focal deficit, normal reflexes, normal muscle strength, normal sensation to fine touch and vibration Psychiatry- alert and oriented to person, place and time, normal mood and affect   Labs- CBC    Component Value Date/Time   WBC 10.3 10/30/2013 0936   WBC 12.3* 04/10/2010 1000   RBC 4.76 10/30/2013 0936   RBC 4.46 04/10/2010 1000   HGB 14.8 10/30/2013 0936   HCT 42.7 10/30/2013 0936   PLT 187 SPECIMEN CHECKED FOR CLOTS PLATELET COUNT CONFIRMED BY SMEAR 04/10/2010 1000   MCV 90 10/30/2013 0936   MCH 31.1 10/30/2013 0936   MCHC 34.7 10/30/2013 0936   MCHC 33.9 04/10/2010 1000   RDW 14.3 10/30/2013 0936   RDW 13.6 04/10/2010 1000   LYMPHSABS 8.0* 10/30/2013 0936   LYMPHSABS 6.7* 05/24/2009 1552   MONOABS 1.1* 05/24/2009 1552   EOSABS 0.1 10/30/2013 0936   EOSABS 0.2 05/24/2009 1552   BASOSABS 0.1 10/30/2013 0936   BASOSABS 0.0 05/24/2009 1552    CMP     Component Value Date/Time   NA 141 10/30/2013 0936   NA 139 04/10/2010 1000   K 4.1 10/30/2013 0936   CL 106 10/30/2013 0936   CO2 23 10/30/2013 0936   GLUCOSE 93 10/30/2013 0936   GLUCOSE 99 04/10/2010 1000   BUN 10 10/30/2013 0936   BUN 13 04/10/2010 1000   CREATININE 1.04 10/30/2013 0936   CALCIUM 9.0 10/30/2013 0936   PROT 7.9 10/30/2013 0936   PROT 7.9 04/10/2010 1000   ALBUMIN 3.8 04/10/2010 1000   AST 34 10/30/2013 0936   ALT 30 10/30/2013 0936   ALKPHOS 82 10/30/2013 0936   BILITOT 0.4 10/30/2013 0936   GFRNONAA 79 10/30/2013 0936   GFRAA 92 10/30/2013 0936   Lipid Panel     Component Value Date/Time   CHOL 194 06/30/2009 1505   CHOL 194 06/30/2009 1505   TRIG 267* 10/30/2013 0936   HDL 31* 10/30/2013 0936   HDL 31* 06/30/2009 1505   HDL 31* 06/30/2009 1505   CHOLHDL 6.2* 10/30/2013 0936   CHOLHDL 6.3 Ratio  06/30/2009 1505   CHOLHDL 6.3 Ratio 06/30/2009 1505   VLDL 72* 06/30/2009 1505   VLDL 72* 06/30/2009 1505   LDLCALC 108* 10/30/2013 0936   LDLCALC 91 06/30/2009 1505   LDLCALC 91 06/30/2009 1505   01/06/14 ekg sinus rhythm, LAD, s1,s2, s3 pattern, left anterior fascicular block  Assessment/plan  1. Unspecified essential hypertension Elevated bp reading. Pt also mentions bp being elevated at home. Will increase hctz to 50 mg daily for now - EKG 12-Lead - CMP; Future  2. GERD Symptoms under control. Off all medication  3. NARCOTIC ABUSE Helps with his back pain. Can continue this  4. LOW BACK PAIN, CHRONIC Continue prn roxicodone for now. Pt would like evaluation for TENS unit. Provide PT referral  5. Other and unspecified hyperlipidemia Reviewed lipid panel. Will start him on simvastatin 10 mg daily for now. Common side effects explained - CMP; Future  6. Glaucoma Continue lumigan eye  drops  7. Routine general medical examination at a health care facility uptodate with screening exam. Reviewed ekg. Reviewed labs. Encouraged exercise and dietary modification  8. Left anterior fascicular block No old ekg to compare with. his long standing HTN and age could be contributing to this. No clinical symptom of heart failure. No known hx of CAD. Will monitor clinically for now

## 2014-01-10 ENCOUNTER — Emergency Department (HOSPITAL_COMMUNITY)
Admission: EM | Admit: 2014-01-10 | Discharge: 2014-01-10 | Disposition: A | Discharge status: Home / Self Care | Payer: Medicare Other | Attending: Emergency Medicine | Admitting: Emergency Medicine

## 2014-01-10 ENCOUNTER — Encounter (HOSPITAL_COMMUNITY): Payer: Self-pay | Admitting: Emergency Medicine

## 2014-01-10 ENCOUNTER — Emergency Department (HOSPITAL_COMMUNITY): Payer: Medicare Other

## 2014-01-10 DIAGNOSIS — Z791 Long term (current) use of non-steroidal anti-inflammatories (NSAID): Secondary | ICD-10-CM | POA: Insufficient documentation

## 2014-01-10 DIAGNOSIS — Z8669 Personal history of other diseases of the nervous system and sense organs: Secondary | ICD-10-CM | POA: Insufficient documentation

## 2014-01-10 DIAGNOSIS — Z872 Personal history of diseases of the skin and subcutaneous tissue: Secondary | ICD-10-CM | POA: Insufficient documentation

## 2014-01-10 DIAGNOSIS — I10 Essential (primary) hypertension: Secondary | ICD-10-CM | POA: Insufficient documentation

## 2014-01-10 DIAGNOSIS — R42 Dizziness and giddiness: Secondary | ICD-10-CM | POA: Insufficient documentation

## 2014-01-10 DIAGNOSIS — Z79899 Other long term (current) drug therapy: Secondary | ICD-10-CM | POA: Insufficient documentation

## 2014-01-10 DIAGNOSIS — Z8659 Personal history of other mental and behavioral disorders: Secondary | ICD-10-CM | POA: Insufficient documentation

## 2014-01-10 DIAGNOSIS — IMO0002 Reserved for concepts with insufficient information to code with codable children: Secondary | ICD-10-CM | POA: Insufficient documentation

## 2014-01-10 DIAGNOSIS — Z87891 Personal history of nicotine dependence: Secondary | ICD-10-CM | POA: Insufficient documentation

## 2014-01-10 DIAGNOSIS — Z8619 Personal history of other infectious and parasitic diseases: Secondary | ICD-10-CM | POA: Insufficient documentation

## 2014-01-10 DIAGNOSIS — Z8719 Personal history of other diseases of the digestive system: Secondary | ICD-10-CM | POA: Insufficient documentation

## 2014-01-10 DIAGNOSIS — M1990 Unspecified osteoarthritis, unspecified site: Secondary | ICD-10-CM | POA: Insufficient documentation

## 2014-01-10 LAB — BASIC METABOLIC PANEL
BUN: 16 mg/dL (ref 6–23)
CALCIUM: 9 mg/dL (ref 8.4–10.5)
CHLORIDE: 100 meq/L (ref 96–112)
CO2: 28 meq/L (ref 19–32)
Creatinine, Ser: 0.98 mg/dL (ref 0.50–1.35)
GFR calc non Af Amer: 90 mL/min — ABNORMAL LOW (ref >90)
Glucose, Bld: 103 mg/dL — ABNORMAL HIGH (ref 70–99)
Potassium: 3.6 mEq/L — ABNORMAL LOW (ref 3.7–5.3)
SODIUM: 141 meq/L (ref 137–147)

## 2014-01-10 LAB — CBC WITH DIFFERENTIAL/PLATELET
Basophils Absolute: 0.1 10*3/uL (ref 0.0–0.1)
Basophils Relative: 1 % (ref 0–1)
EOS PCT: 1 % (ref 0–5)
Eosinophils Absolute: 0.1 10*3/uL (ref 0.0–0.7)
HCT: 43.4 % (ref 39.0–52.0)
Hemoglobin: 15.4 g/dL (ref 13.0–17.0)
Lymphocytes Relative: 72 % — ABNORMAL HIGH (ref 12–46)
Lymphs Abs: 5.4 10*3/uL — ABNORMAL HIGH (ref 0.7–4.0)
MCH: 31.5 pg (ref 26.0–34.0)
MCHC: 35.5 g/dL (ref 30.0–36.0)
MCV: 88.8 fL (ref 78.0–100.0)
MONO ABS: 0.8 10*3/uL (ref 0.1–1.0)
MONOS PCT: 10 % (ref 3–12)
NEUTROS PCT: 16 % — AB (ref 43–77)
Neutro Abs: 1.2 10*3/uL — ABNORMAL LOW (ref 1.7–7.7)
Platelets: 220 10*3/uL (ref 150–400)
RBC: 4.89 MIL/uL (ref 4.22–5.81)
RDW: 14.1 % (ref 11.5–15.5)
WBC: 7.6 10*3/uL (ref 4.0–10.5)

## 2014-01-10 LAB — TROPONIN I: Troponin I: <0.3 ng/mL (ref <0.30)

## 2014-01-10 MED ORDER — SODIUM CHLORIDE 0.9 % IV BOLUS (SEPSIS)
1000.0000 mL | Freq: Once | INTRAVENOUS | Status: AC
Start: 2014-01-10 — End: 2014-01-10
  Administered 2014-01-10: 1000 mL via INTRAVENOUS

## 2014-01-10 NOTE — ED Provider Notes (Signed)
Medical screening examination/treatment/procedure(s) were conducted as a shared visit with non-physician practitioner(s) and myself.  I personally evaluated the patient during the encounter.  EKG Interpretation    Date/Time:  Sunday January 10 2014 10:07:12 EST Ventricular Rate:  71 PR Interval:  158 QRS Duration: 99 QT Interval:  418 QTC Calculation: 454 R Axis:   -64 Text Interpretation:  Sinus rhythm Left anterior fascicular block compared to prior, LAFB is new. Confirmed by Nathan Hedgepeth  MD, TREY ([id number]) on 01/10/2014 10:15:04 AM            57  yo male with dizziness which started yesterday.  Described as a foggy headed feeling.  Not positional.  No syncope.  No CP, SOB, diaphoresis.  Does get nauseated, but only when he takes oxycodone.  On exam, well appearing, nontoxic, alert and oriented, heart sounds normal, RRR, Lungs CTAB, abd soft, nontender.  Plan to check basic labs, but suspect dizziness is pharmacologic.  (recent changes to BP meds and pain meds).    Clinical Impression: 1. Dizziness, nonspecific       Nathan ChurnJohn David Heith Haigler, MD 01/10/14 1622
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
## 2014-01-10 NOTE — ED Notes (Signed)
Pt c/o dizziness, SOB since yesterday.  Went to his family doctor Friday for routine check up and they said that something was wrong with his EKG.  They put him on a cholesterol medicine.  Also c/o CP

## 2014-01-11 NOTE — ED Provider Notes (Signed)
Medical screening examination/treatment/procedure(s) were conducted as a shared visit with non-physician practitioner(s) and myself.  I personally evaluated the patient during the encounter.  EKG Interpretation    Date/Time:  Sunday January 10 2014 10:07:12 EST Ventricular Rate:  71 PR Interval:  158 QRS Duration: 99 QT Interval:  418 QTC Calculation: 454 R Axis:   -64 Text Interpretation:  Sinus rhythm Left anterior fascicular block compared to prior, LAFB is new. Confirmed by [facility]  MD, TREY [phone] on 01/10/2014 10:15:04 AM              Candyce ChurnJohn David Tyree Fluharty, MD 01/11/14 [phone]

## 2014-01-12 LAB — PATHOLOGIST SMEAR REVIEW

## 2014-01-18 ENCOUNTER — Other Ambulatory Visit: Payer: Self-pay | Admitting: *Deleted

## 2014-01-18 ENCOUNTER — Ambulatory Visit: Payer: Medicare Other | Attending: Internal Medicine | Admitting: Physical Therapy

## 2014-01-18 MED ORDER — OXYCODONE HCL 15 MG PO TABS: ORAL_TABLET | ORAL | Status: DC

## 2014-02-15 ENCOUNTER — Other Ambulatory Visit: Payer: Self-pay | Admitting: *Deleted

## 2014-02-15 MED ORDER — OXYCODONE HCL 15 MG PO TABS: ORAL_TABLET | ORAL | Status: DC

## 2014-02-18 ENCOUNTER — Encounter (HOSPITAL_COMMUNITY): Payer: Self-pay | Admitting: Emergency Medicine

## 2014-02-18 ENCOUNTER — Emergency Department (HOSPITAL_COMMUNITY): Payer: Medicare Other

## 2014-02-18 ENCOUNTER — Emergency Department (HOSPITAL_COMMUNITY)
Admission: EM | Admit: 2014-02-18 | Discharge: 2014-02-19 | Disposition: A | Discharge status: Home / Self Care | Payer: Medicare Other | Attending: Emergency Medicine | Admitting: Emergency Medicine

## 2014-02-18 DIAGNOSIS — M129 Arthropathy, unspecified: Secondary | ICD-10-CM | POA: Insufficient documentation

## 2014-02-18 DIAGNOSIS — K7460 Unspecified cirrhosis of liver: Secondary | ICD-10-CM | POA: Insufficient documentation

## 2014-02-18 DIAGNOSIS — R0602 Shortness of breath: Secondary | ICD-10-CM | POA: Insufficient documentation

## 2014-02-18 DIAGNOSIS — R071 Chest pain on breathing: Secondary | ICD-10-CM | POA: Insufficient documentation

## 2014-02-18 DIAGNOSIS — B182 Chronic viral hepatitis C: Secondary | ICD-10-CM | POA: Insufficient documentation

## 2014-02-18 DIAGNOSIS — I10 Essential (primary) hypertension: Secondary | ICD-10-CM | POA: Insufficient documentation

## 2014-02-18 DIAGNOSIS — Z79899 Other long term (current) drug therapy: Secondary | ICD-10-CM | POA: Insufficient documentation

## 2014-02-18 DIAGNOSIS — Z87891 Personal history of nicotine dependence: Secondary | ICD-10-CM | POA: Insufficient documentation

## 2014-02-18 DIAGNOSIS — R0789 Other chest pain: Secondary | ICD-10-CM

## 2014-02-18 DIAGNOSIS — F319 Bipolar disorder, unspecified: Secondary | ICD-10-CM | POA: Insufficient documentation

## 2014-02-18 DIAGNOSIS — H409 Unspecified glaucoma: Secondary | ICD-10-CM | POA: Insufficient documentation

## 2014-02-18 LAB — I-STAT TROPONIN, ED: TROPONIN I, POC: 0.01 ng/mL (ref 0.00–0.08)

## 2014-02-18 LAB — PRO B NATRIURETIC PEPTIDE: PRO B NATRI PEPTIDE: 15.4 pg/mL (ref 0–125)

## 2014-02-18 LAB — CBC
HEMATOCRIT: 39.4 % (ref 39.0–52.0)
Hemoglobin: 14.2 g/dL (ref 13.0–17.0)
MCH: 31.5 pg (ref 26.0–34.0)
MCHC: 36 g/dL (ref 30.0–36.0)
MCV: 87.4 fL (ref 78.0–100.0)
Platelets: 193 10*3/uL (ref 150–400)
RBC: 4.51 MIL/uL (ref 4.22–5.81)
RDW: 13.4 % (ref 11.5–15.5)
WBC: 10.2 10*3/uL (ref 4.0–10.5)

## 2014-02-18 LAB — BASIC METABOLIC PANEL
BUN: 16 mg/dL (ref 6–23)
CO2: 27 mEq/L (ref 19–32)
Calcium: 9.1 mg/dL (ref 8.4–10.5)
Chloride: 101 mEq/L (ref 96–112)
Creatinine, Ser: 0.95 mg/dL (ref 0.50–1.35)
Glucose, Bld: 92 mg/dL (ref 70–99)
POTASSIUM: 4.3 meq/L (ref 3.7–5.3)
SODIUM: 141 meq/L (ref 137–147)

## 2014-02-18 MED ORDER — NAPROXEN 500 MG PO TABS: 500.0000 mg | ORAL_TABLET | Freq: Two times a day (BID) | ORAL | Status: DC

## 2014-02-18 MED ORDER — KETOROLAC TROMETHAMINE 60 MG/2ML IM SOLN
60.0000 mg | Freq: Once | INTRAMUSCULAR | Status: AC
  Administered 2014-02-19: 60 mg via INTRAMUSCULAR
  Filled 2014-02-18: qty 2

## 2014-02-18 NOTE — ED Provider Notes (Signed)
CSN: [id number]     Arrival date & time 02/18/14  2054 History   First MD Initiated Contact with Patient 02/18/14 2142     Chief Complaint  Patient presents with  . Chest Pain    Right side, radiates to central chest, worsens with deep inspiration     (Consider location/radiation/quality/duration/timing/severity/associated sxs/prior Treatment) Patient is a 58 y.o. male presenting with chest pain. The history is provided by the patient. No language interpreter was used.  Chest Pain Pain location:  R lateral chest Pain quality: sharp   Pain radiates to the back: yes   Pain severity:  Moderate Onset quality:  Gradual Duration:  3 days Timing:  Intermittent Progression:  Waxing and waning Chronicity:  New Context: breathing, movement and raising an arm   Associated symptoms: shortness of breath   Associated symptoms: no cough, no nausea and not vomiting   Associated symptoms comment:  Hurts to take deep breaths. Risk factors: hypertension     Past Medical History  Diagnosis Date  . Hypertension   . Arthritis   . Back ache   . Urinary frequency   . Generalized osteoarthrosis, involving multiple sites   . Dermatophytosis of the body   . Chronic hepatitis C without mention of hepatic coma   . Bipolar I disorder, most recent episode (or current) unspecified   . Unspecified glaucoma   . Cirrhosis of liver without mention of alcohol   . Osteoarthrosis, unspecified whether generalized or localized, unspecified site    Past Surgical History  Procedure Laterality Date  . Penile prosthesis implant  04/12/2010    Dr.Grapey, Insertion of 3 peice penile prosthesis   . Anal fissure repair  2008  . Abcess drainage      Gluteal MRSA drainage   . Colonoscopy  2008    Dr.Edwards, Internal Hemorrhoids    Family History  Problem Relation Age of Onset  . Hypertension Mother   . Stroke Father   . Cancer Father     colon  . Diabetes Mother    History  Substance Use Topics  .  Smoking status: Former Games developermoker  . Smokeless tobacco: Not on file  . Alcohol Use: Yes     Comment: denies    Review of Systems  Respiratory: Positive for shortness of breath. Negative for cough.   Cardiovascular: Positive for chest pain.  Gastrointestinal: Negative for nausea and vomiting.  All other systems reviewed and are negative.      Allergies  Review of patient's allergies indicates no known allergies.  Home Medications   Current Outpatient Rx  Name  Route  Sig  Dispense  Refill  . bimatoprost (LUMIGAN) 0.03 % ophthalmic drops   Both Eyes   Place 1 drop into both eyes at bedtime.          . hydrochlorothiazide (HYDRODIURIL) 50 MG tablet      take 1 tablet by mouth once daily for blood pressure   30 tablet   3   . naproxen sodium (ANAPROX) 220 MG tablet   Oral   Take 440 mg by mouth as needed (pain).          Marland Kitchen. oxyCODONE (ROXICODONE) 15 MG immediate release tablet      Take one tablet by mouth 6 hours as needed for pain   120 tablet   0   . simvastatin (ZOCOR) 10 MG tablet   Oral   Take 1 tablet (10 mg total) by mouth at bedtime.   90  tablet   3    BP 112/93  Pulse 89  Temp(Src) 97.5 F (36.4 C) (Oral)  Resp 22  Ht 5\' 11"  (1.803 m)  Wt 185 lb (83.915 kg)  BMI 25.81 kg/m2  SpO2 97% Physical Exam  Nursing note and vitals reviewed. Constitutional: He is oriented to person, place, and time. He appears well-developed and well-nourished.  HENT:  Head: Normocephalic and atraumatic.  Eyes: Pupils are equal, round, and reactive to light.  Neck: Normal range of motion.  Cardiovascular: Normal rate and regular rhythm.   Pulmonary/Chest: Effort normal and breath sounds normal. He exhibits tenderness.  Abdominal: Soft. Bowel sounds are normal.  Musculoskeletal: He exhibits no edema and no tenderness.  Lymphadenopathy:    He has no cervical adenopathy.  Neurological: He is alert and oriented to person, place, and time.  Skin: Skin is warm and dry.   Psychiatric: He has a normal mood and affect. His behavior is normal. Judgment and thought content normal.    ED Course  Procedures (including critical care time) Labs Review Labs Reviewed  CBC  BASIC METABOLIC PANEL  PRO B NATRIURETIC PEPTIDE  I-STAT TROPOININ, ED   Imaging Review No results found.   EKG Interpretation None     Patient reporting right-lateral chest wall pain onset initially 3 days ago after falling on the ice.  Pain localized to the right chest wall, increases with movement and deep inspiration.  No dyspnea, not hypoxic.  Equal bilateral breath sounds.  EKG without ischemic changes. Normal troponin.  Doubt cardiac origin, likely musculoskeletal in origin.  CXR pending.   No acute abnormalities on chest film.  Will treat as chest wall pain with anti-inflammatory.  Return precautions discussed. MDM   Final diagnoses:  None    Chest wall pain.    Jimmye Norman, NP 02/19/14 0003

## 2014-02-18 NOTE — ED Notes (Signed)
Patient transported to room via w/c. Patient was requested to undress completely and change into gown. Warm blanket given.

## 2014-02-18 NOTE — Discharge Instructions (Signed)

## 2014-02-18 NOTE — ED Notes (Signed)
Patient c/o right sided chest pain, states symptoms initially appeared 3 days ago, patient states he then slipped and fell on the snow/ice, landing on his right side which has increased his pain. Patient states pain worsens with deep inspiration.

## 2014-02-19 NOTE — ED Provider Notes (Signed)
Medical screening examination/treatment/procedure(s) were performed by non-physician practitioner and as supervising physician I was immediately available for consultation/collaboration.   EKG Interpretation   Date/Time:  Thursday February 18 2014 21:04:13 EST Ventricular Rate:  91 PR Interval:  155 QRS Duration: 91 QT Interval:  369 QTC Calculation: 454 R Axis:   -68 Text Interpretation:  Sinus rhythm Left anterior fascicular block No  significant change was found Confirmed by Meilech Virts  MD, Caryn BeeKEVIN ([id number]) on  02/19/2014 12:05:09 AM        Lyanne CoKevin M Gedalya Jim, MD 02/19/14 [id number]

## 2014-03-17 ENCOUNTER — Other Ambulatory Visit: Payer: Self-pay | Admitting: *Deleted

## 2014-03-17 MED ORDER — OXYCODONE HCL 15 MG PO TABS: ORAL_TABLET | ORAL | Status: DC

## 2014-04-01 ENCOUNTER — Other Ambulatory Visit: Payer: Medicare Other

## 2014-04-01 DIAGNOSIS — I10 Essential (primary) hypertension: Secondary | ICD-10-CM

## 2014-04-01 DIAGNOSIS — E785 Hyperlipidemia, unspecified: Secondary | ICD-10-CM

## 2014-04-02 LAB — COMPREHENSIVE METABOLIC PANEL
ALT: 38 IU/L (ref 0–44)
AST: 42 IU/L — AB (ref 0–40)
Albumin/Globulin Ratio: 1.3 (ref 1.1–2.5)
Albumin: 4.2 g/dL (ref 3.5–5.5)
Alkaline Phosphatase: 72 IU/L (ref 39–117)
BUN/Creatinine Ratio: 12 (ref 9–20)
BUN: 11 mg/dL (ref 6–24)
CALCIUM: 9.3 mg/dL (ref 8.7–10.2)
CHLORIDE: 102 mmol/L (ref 97–108)
CO2: 26 mmol/L (ref 18–29)
Creatinine, Ser: 0.91 mg/dL (ref 0.76–1.27)
GFR calc Af Amer: 108 mL/min/{1.73_m2} (ref >59)
GFR calc non Af Amer: 93 mL/min/{1.73_m2} (ref >59)
GLUCOSE: 93 mg/dL (ref 65–99)
Globulin, Total: 3.3 g/dL (ref 1.5–4.5)
POTASSIUM: 4.2 mmol/L (ref 3.5–5.2)
SODIUM: 141 mmol/L (ref 134–144)
TOTAL PROTEIN: 7.5 g/dL (ref 6.0–8.5)
Total Bilirubin: 0.5 mg/dL (ref 0.0–1.2)

## 2014-04-06 ENCOUNTER — Ambulatory Visit (INDEPENDENT_AMBULATORY_CARE_PROVIDER_SITE_OTHER): Payer: Medicare Other | Admitting: Internal Medicine

## 2014-04-06 ENCOUNTER — Encounter: Payer: Self-pay | Admitting: Internal Medicine

## 2014-04-06 VITALS — BP 140/88 | HR 64 | Resp 10 | Wt 187.0 lb

## 2014-04-06 DIAGNOSIS — B1710 Acute hepatitis C without hepatic coma: Secondary | ICD-10-CM

## 2014-04-06 DIAGNOSIS — Z87891 Personal history of nicotine dependence: Secondary | ICD-10-CM

## 2014-04-06 DIAGNOSIS — H409 Unspecified glaucoma: Secondary | ICD-10-CM

## 2014-04-06 DIAGNOSIS — M545 Low back pain, unspecified: Secondary | ICD-10-CM

## 2014-04-06 DIAGNOSIS — K219 Gastro-esophageal reflux disease without esophagitis: Secondary | ICD-10-CM

## 2014-04-06 DIAGNOSIS — I10 Essential (primary) hypertension: Secondary | ICD-10-CM

## 2014-04-06 DIAGNOSIS — E785 Hyperlipidemia, unspecified: Secondary | ICD-10-CM

## 2014-04-06 NOTE — Progress Notes (Signed)
Patient ID: Nathan Buchanan, male   DOB: 18-Jun-1956, 58 y.o.   MRN: [id number]    Chief Complaint  Patient presents with  . Medical Management of Chronic Issues    3 month follow-up, discuss labs (copy printed). No concerns    No Known Allergies  HPI 58 y/o male pt is here for routine follow up. [facility] He has been tolerating the hctz well and taking 50 mg daily. No further dizziness.  Has stopped smoking for 2 years now. Denies any cough or trouble breathing. Denies claudication or color change in his legs Mood has been stable. He had depression in the past but his mood has been stable now. Off all medications. Has his eye drops for his glaucoma and is uptodate with eye exam Has remote history of ? Hep c and completed treatment. Recent lft normal. Not on any medication  Review of Systems  Constitutional: Negative for fever, chills, weight loss, malaise/fatigue and diaphoresis.  HENT: Negative for congestion, hearing loss and sore throat.   Eyes: Negative for blurred vision, double vision and discharge.  Respiratory: Negative for cough, sputum production, shortness of breath and wheezing.   Cardiovascular: Negative for chest pain, palpitations, orthopnea and leg swelling.  Gastrointestinal: Negative for heartburn, nausea, vomiting, abdominal pain, diarrhea and constipation.  Genitourinary: Negative for dysuria, urgency, frequency and flank pain.  Musculoskeletal: Negative for falls, joint pain and myalgias.  Skin: Negative for itching and rash.  Neurological: Negative for dizziness, tingling, focal weakness and headaches.  Psychiatric/Behavioral: Negative for depression and memory loss. The patient is not nervous/anxious.    Past Medical History  Diagnosis Date  . Hypertension   . Arthritis   . Back ache   . Urinary frequency   . Generalized osteoarthrosis, involving multiple sites   . Dermatophytosis of the body   . Chronic hepatitis C without  mention of hepatic coma   . Bipolar I disorder, most recent episode (or current) unspecified   . Unspecified glaucoma   . Cirrhosis of liver without mention of alcohol   . Osteoarthrosis, unspecified whether generalized or localized, unspecified site    Past Surgical History  Procedure Laterality Date  . Penile prosthesis implant  04/12/2010    Dr.Grapey, Insertion of 3 peice penile prosthesis   . Anal fissure repair  2008  . Abcess drainage      Gluteal MRSA drainage   . Colonoscopy  2008    Dr.Edwards, Internal Hemorrhoids    Current Outpatient Prescriptions on File Prior to Visit  Medication Sig Dispense Refill  . bimatoprost (LUMIGAN) 0.03 % ophthalmic drops Place 1 drop into both eyes at bedtime.       . hydrochlorothiazide (HYDRODIURIL) 50 MG tablet take 1 tablet by mouth once daily for blood pressure  30 tablet  3  . oxyCODONE (ROXICODONE) 15 MG immediate release tablet Take one tablet by mouth 6 hours as needed for pain  120 tablet  0  . simvastatin (ZOCOR) 10 MG tablet Take 1 tablet (10 mg total) by mouth at bedtime.  90 tablet  3   No current facility-administered medications on file prior to visit.    Physical exam BP 140/88  Pulse 64  Resp 10  Wt 187 lb (84.823 kg)  SpO2 98%  General- adult male in no acute distress, thin built Head- atraumatic, normocephalic Eyes- PERRLA, EOMI, no pallor, no icterus, no discharge Neck- no lymphadenopathy, no thyromegaly, no jugular vein distension, no carotid bruit  Cardiovascular- normal s1,s2, no murmurs/ rubs/ gallops Respiratory- bilateral clear to auscultation, no wheeze, no rhonchi, no crackles Abdomen- bowel sounds present, soft, non tender, no organomegaly, no abdominal bruits, no guarding or rigidity, no CVA tenderness Musculoskeletal- able to move all 4 extremities, lumbar paraspinal tenderness on exam, occassionally uses a cane Neurological- no focal deficit, normal reflexes, normal muscle strength, normal sensation to  fine touch and vibration Psychiatry- alert and oriented to person, place and time, normal mood and affect   Labs- Lipid Panel     Component Value Date/Time   CHOL 194 06/30/2009 1505   CHOL 194 06/30/2009 1505   TRIG 267* 10/30/2013 0936   HDL 31* 10/30/2013 0936   HDL 31* 06/30/2009 1505   HDL 31* 06/30/2009 1505   CHOLHDL 6.2* 10/30/2013 0936   CHOLHDL 6.3 Ratio 06/30/2009 1505   CHOLHDL 6.3 Ratio 06/30/2009 1505   VLDL 72* 06/30/2009 1505   VLDL 72* 06/30/2009 1505   LDLCALC 108* 10/30/2013 0936   LDLCALC 91 06/30/2009 1505   LDLCALC 91 06/30/2009 1505    CBC    Component Value Date/Time   WBC 10.2 02/18/2014 2140   WBC 10.3 10/30/2013 0936   RBC 4.51 02/18/2014 2140   RBC 4.76 10/30/2013 0936   HGB 14.2 02/18/2014 2140   HCT 39.4 02/18/2014 2140   PLT 193 02/18/2014 2140   MCV 87.4 02/18/2014 2140   MCH 31.5 02/18/2014 2140   MCH 31.1 10/30/2013 0936   MCHC 36.0 02/18/2014 2140   MCHC 34.7 10/30/2013 0936   RDW 13.4 02/18/2014 2140   RDW 14.3 10/30/2013 0936   LYMPHSABS 5.4* 01/10/2014 1030   LYMPHSABS 8.0* 10/30/2013 0936   MONOABS 0.8 01/10/2014 1030   EOSABS 0.1 01/10/2014 1030   EOSABS 0.1 10/30/2013 0936   BASOSABS 0.1 01/10/2014 1030   BASOSABS 0.1 10/30/2013 0936   CMP     Component Value Date/Time   NA 141 04/01/2014 1044   NA 141 02/18/2014 2140   K 4.2 04/01/2014 1044   CL 102 04/01/2014 1044   CO2 26 04/01/2014 1044   GLUCOSE 93 04/01/2014 1044   GLUCOSE 92 02/18/2014 2140   BUN 11 04/01/2014 1044   BUN 16 02/18/2014 2140   CREATININE 0.91 04/01/2014 1044   CALCIUM 9.3 04/01/2014 1044   PROT 7.5 04/01/2014 1044   PROT 7.9 04/10/2010 1000   ALBUMIN 3.8 04/10/2010 1000   AST 42* 04/01/2014 1044   ALT 38 04/01/2014 1044   ALKPHOS 72 04/01/2014 1044   BILITOT 0.5 04/01/2014 1044   GFRNONAA 93 04/01/2014 1044   GFRAA 108 04/01/2014 1044    Assessment/plan  1. HYPERTENSION Stable , continue hctz 50 mg daily. Tolerating this well. Reviewed bmp  2. HEPATITIS C Check hep c antibody as  we have no confirmed report of hep c  3. GERD Stable. Off all medications  4. LOW BACK PAIN, CHRONIC Stable on prn oycodone   5. Other and unspecified hyperlipidemia Continue zocor 10 mg daily and cut down on fried food  6. GLAUCOMA Stable, continue lumigan eye drops  7. Former tobacco use Continues to abstain, monitor for early signs of copd  Did phq2 score and pt scored 0

## 2014-04-13 ENCOUNTER — Encounter: Payer: Self-pay | Admitting: *Deleted

## 2014-04-13 LAB — SPECIMEN STATUS REPORT

## 2014-04-14 ENCOUNTER — Other Ambulatory Visit: Payer: Self-pay | Admitting: *Deleted

## 2014-04-14 LAB — SPECIMEN STATUS REPORT

## 2014-04-14 LAB — HEPATITIS C ANTIBODY: Hep C Virus Ab: >11 s/co ratio — ABNORMAL HIGH (ref 0.0–0.9)

## 2014-04-14 MED ORDER — OXYCODONE HCL 15 MG PO TABS: ORAL_TABLET | ORAL | Status: DC

## 2014-05-13 ENCOUNTER — Other Ambulatory Visit: Payer: Self-pay | Admitting: *Deleted

## 2014-05-13 MED ORDER — OXYCODONE HCL 15 MG PO TABS
ORAL_TABLET | ORAL | Status: DC
Start: 2014-05-13 — End: 2014-06-10

## 2014-06-10 ENCOUNTER — Other Ambulatory Visit: Payer: Self-pay | Admitting: *Deleted

## 2014-06-10 MED ORDER — OXYCODONE HCL 15 MG PO TABS: ORAL_TABLET | ORAL | Status: DC

## 2014-06-10 NOTE — Telephone Encounter (Signed)
Printed per Dr. Reed 

## 2014-07-08 ENCOUNTER — Other Ambulatory Visit: Payer: Self-pay | Admitting: *Deleted

## 2014-07-08 MED ORDER — OXYCODONE HCL 15 MG PO TABS: ORAL_TABLET | ORAL | Status: DC

## 2014-07-08 NOTE — Telephone Encounter (Signed)
Patient Requested 

## 2014-08-06 ENCOUNTER — Other Ambulatory Visit: Payer: Self-pay | Admitting: *Deleted

## 2014-08-06 MED ORDER — OXYCODONE HCL 15 MG PO TABS: ORAL_TABLET | ORAL | Status: DC

## 2014-08-06 NOTE — Telephone Encounter (Signed)
Patient Requested 

## 2014-08-25 ENCOUNTER — Other Ambulatory Visit: Payer: Self-pay | Admitting: Internal Medicine

## 2014-08-26 ENCOUNTER — Emergency Department (HOSPITAL_COMMUNITY)
Admission: EM | Admit: 2014-08-26 | Discharge: 2014-08-26 | Disposition: A | Discharge status: Home / Self Care | Payer: Medicare Other | Attending: Emergency Medicine | Admitting: Emergency Medicine

## 2014-08-26 ENCOUNTER — Encounter (HOSPITAL_COMMUNITY): Payer: Self-pay | Admitting: Emergency Medicine

## 2014-08-26 DIAGNOSIS — Z8739 Personal history of other diseases of the musculoskeletal system and connective tissue: Secondary | ICD-10-CM | POA: Diagnosis not present

## 2014-08-26 DIAGNOSIS — K0381 Cracked tooth: Secondary | ICD-10-CM | POA: Diagnosis not present

## 2014-08-26 DIAGNOSIS — K089 Disorder of teeth and supporting structures, unspecified: Secondary | ICD-10-CM | POA: Insufficient documentation

## 2014-08-26 DIAGNOSIS — IMO0002 Reserved for concepts with insufficient information to code with codable children: Secondary | ICD-10-CM | POA: Diagnosis not present

## 2014-08-26 DIAGNOSIS — H409 Unspecified glaucoma: Secondary | ICD-10-CM | POA: Diagnosis not present

## 2014-08-26 DIAGNOSIS — I10 Essential (primary) hypertension: Secondary | ICD-10-CM | POA: Insufficient documentation

## 2014-08-26 DIAGNOSIS — Z8659 Personal history of other mental and behavioral disorders: Secondary | ICD-10-CM | POA: Diagnosis not present

## 2014-08-26 DIAGNOSIS — Z87891 Personal history of nicotine dependence: Secondary | ICD-10-CM | POA: Diagnosis not present

## 2014-08-26 DIAGNOSIS — K029 Dental caries, unspecified: Secondary | ICD-10-CM

## 2014-08-26 DIAGNOSIS — Z79899 Other long term (current) drug therapy: Secondary | ICD-10-CM | POA: Diagnosis not present

## 2014-08-26 DIAGNOSIS — Z8619 Personal history of other infectious and parasitic diseases: Secondary | ICD-10-CM | POA: Insufficient documentation

## 2014-08-26 DIAGNOSIS — K0889 Other specified disorders of teeth and supporting structures: Secondary | ICD-10-CM

## 2014-08-26 MED ORDER — OXYCODONE-ACETAMINOPHEN 5-325 MG PO TABS
2.0000 | ORAL_TABLET | Freq: Once | ORAL | Status: AC
Start: 2014-08-26 — End: 2014-08-26
  Administered 2014-08-26: 2 via ORAL
  Filled 2014-08-26: qty 2

## 2014-08-26 MED ORDER — AMOXICILLIN 500 MG PO CAPS: 500.0000 mg | ORAL_CAPSULE | Freq: Three times a day (TID) | ORAL | Status: DC

## 2014-08-26 NOTE — ED Provider Notes (Signed)
CSN: [id number]     Arrival date & time 08/26/14  1207 History  This chart was scribed for non-physician practitioner working with No att. providers found by Elveria Rising, ED Scribe. This patient was seen in room WTR6/WTR6 and the patient's care was started at 1:58 PM.   Chief Complaint  Patient presents with  . Dental Pain    Patient is a 58 y.o. male presenting with tooth pain. The history is provided by the patient and a significant other. No language interpreter was used.  Dental Pain  HPI Comments: Nathan Buchanan is a 58 y.o. male who presents to the Emergency Department complaining of worsening upper left dental pain. Patient reports issues with a cracked tooth. Pain reports sleep disturbance due to pain severity. He reports exacerbation with chewing and even pressure from his tongue. He reports mild left facial swelling.  He denies fever, chills, nausea, vomiting, discharge, swelling or his gum or neck, shortness of breath, inability to open/close jaw. Patient desires referral to oral surgeon.  Patient is a pain management client and reports losing Oxycodone 15 mg while at an out of town wedding.  A complete 10 system review of systems was obtained and all systems are negative except as noted in the HPI and PMH.    Past Medical History  Diagnosis Date  . Hypertension   . Arthritis   . Back ache   . Urinary frequency   . Generalized osteoarthrosis, involving multiple sites   . Dermatophytosis of the body   . Chronic hepatitis C without mention of hepatic coma   . Bipolar I disorder, most recent episode (or current) unspecified   . Unspecified glaucoma   . Cirrhosis of liver without mention of alcohol   . Osteoarthrosis, unspecified whether generalized or localized, unspecified site    Past Surgical History  Procedure Laterality Date  . Penile prosthesis implant  04/12/2010    Dr.Grapey, Insertion of 3 peice penile prosthesis   . Anal fissure repair  2008  . Abcess drainage       Gluteal MRSA drainage   . Colonoscopy  2008    Dr.Edwards, Internal Hemorrhoids    Family History  Problem Relation Age of Onset  . Hypertension Mother   . Stroke Father   . Cancer Father     colon  . Diabetes Mother    History  Substance Use Topics  . Smoking status: Former Games developer  . Smokeless tobacco: Not on file  . Alcohol Use: Yes     Comment: denies    Review of Systems See HPI   Allergies  Review of patient's allergies indicates no known allergies.  Home Medications   Prior to Admission medications   Medication Sig Start Date End Date Taking? Authorizing Provider  bimatoprost (LUMIGAN) 0.03 % ophthalmic drops Place 1 drop into both eyes at bedtime.    Yes Historical Provider, MD  clotrimazole-betamethasone (LOTRISONE) cream Apply 1 application topically 2 (two) times daily.   Yes Historical Provider, MD  hydrochlorothiazide (HYDRODIURIL) 50 MG tablet Take 50 mg by mouth daily.   Yes Historical Provider, MD  simvastatin (ZOCOR) 10 MG tablet Take 10 mg by mouth daily.   Yes Historical Provider, MD  amoxicillin (AMOXIL) 500 MG capsule Take 1 capsule (500 mg total) by mouth 3 (three) times daily. 08/26/14   Rifky Lapre A Forcucci, PA-C   Triage Vitals: BP 153/95  Pulse 76  Temp(Src) 98.5 F (36.9 C) (Oral)  Resp 20  SpO2 98%  Physical Exam  Nursing note and vitals reviewed. Constitutional: He is oriented to person, place, and time. He appears well-developed and well-nourished. No distress.  HENT:  Head: Normocephalic and atraumatic.  Mouth/Throat: Oropharynx is clear and moist. No oropharyngeal exudate.    Dental caries with signs of decay. no gingival swelling. no signs of dental abscess.  Mild left sided facial swelling. No swelling underneath margin of the jaw.  Eyes: Conjunctivae and EOM are normal. Pupils are equal, round, and reactive to light. No scleral icterus.  Neck: Normal range of motion. Neck supple. No JVD present. No thyromegaly present.   Cardiovascular: Normal rate, regular rhythm, normal heart sounds and intact distal pulses.  Exam reveals no gallop and no friction rub.   No murmur heard. Pulmonary/Chest: Effort normal and breath sounds normal. No respiratory distress. He has no wheezes. He has no rales. He exhibits no tenderness.  Musculoskeletal: Normal range of motion.  Lymphadenopathy:    He has no cervical adenopathy.  Neurological: He is alert and oriented to person, place, and time. He has normal strength. He displays a negative Romberg sign. Coordination normal.  Skin: Skin is warm and dry. He is not diaphoretic.  Psychiatric: He has a normal mood and affect. His behavior is normal. Judgment and thought content normal.    ED Course  Procedures (including critical care time)  COORDINATION OF CARE: 2:05 PM- Will prescribe Amoxicillin and refer to oral surgeon. Discussed treatment plan with patient at bedside and patient agreed to plan.   Labs Review Labs Reviewed - No data to display  Imaging Review No results found.   EKG Interpretation None      MDM   Final diagnoses:  Dental caries  Pain, dental   Patient is a 58 y.o. Male who presents to the ED with dental pain.  There are no signs of trismus, ludwigs angina, or dental abscess at this time.  Patient to be given prescription for amoxicillin.  Patient was given pain medication here and a referral to oral surgery.  Patient to return for signs of trismus or ludwigs angina.  Patient chronic pain patient and was not given pain medication for home.    I personally performed the services described in this documentation, which was scribed in my presence. The recorded information has been reviewed and is accurate.    Eben Burow, PA-C 08/26/14 2008

## 2014-08-26 NOTE — ED Notes (Signed)
Pt co upper left dental pain for a while, sates it has gotten worse.

## 2014-08-26 NOTE — Discharge Instructions (Signed)
Dental Caries °Dental caries (also called tooth decay) is the most common oral disease. It can occur at any age but is more common in children and young adults.  °HOW DENTAL CARIES DEVELOPS  °The process of decay begins when bacteria and foods (particularly sugars and starches) combine in your mouth to produce plaque. Plaque is a substance that sticks to the hard, outer surface of a tooth (enamel). The bacteria in plaque produce acids that attack enamel. These acids may also attack the root surface of a tooth (cementum) if it is exposed. Repeated attacks dissolve these surfaces and create holes in the tooth (cavities). If left untreated, the acids destroy the other layers of the tooth.  °RISK FACTORS °· Frequent sipping of sugary beverages.   °· Frequent snacking on sugary and starchy foods, especially those that easily get stuck in the teeth.   °· Poor oral hygiene.   °· Dry mouth.   °· Substance abuse such as methamphetamine abuse.   °· Broken or poor-fitting dental restorations.   °· Eating disorders.   °· Gastroesophageal reflux disease (GERD).   °· Certain radiation treatments to the head and neck. °SYMPTOMS °In the early stages of dental caries, symptoms are seldom present. Sometimes white, chalky areas may be seen on the enamel or other tooth layers. In later stages, symptoms may include: °· Pits and holes on the enamel. °· Toothache after sweet, hot, or cold foods or drinks are consumed. °· Pain around the tooth. °· Swelling around the tooth. °DIAGNOSIS  °Most of the time, dental caries is detected during a regular dental checkup. A diagnosis is made after a thorough medical and dental history is taken and the surfaces of your teeth are checked for signs of dental caries. Sometimes special instruments, such as lasers, are used to check for dental caries. Dental X-ray exams may be taken so that areas not visible to the eye (such as between the contact areas of the teeth) can be checked for cavities.    °TREATMENT  °If dental caries is in its early stages, it may be reversed with a fluoride treatment or an application of a remineralizing agent at the dental office. Thorough brushing and flossing at home is needed to aid these treatments. If it is in its later stages, treatment depends on the location and extent of tooth destruction:  °· If a small area of the tooth has been destroyed, the destroyed area will be removed and cavities will be filled with a material such as gold, silver amalgam, or composite resin.   °· If a large area of the tooth has been destroyed, the destroyed area will be removed and a cap (crown) will be fitted over the remaining tooth structure.   °· If the center part of the tooth (pulp) is affected, a procedure called a root canal will be needed before a filling or crown can be placed.   °· If most of the tooth has been destroyed, the tooth may need to be pulled (extracted). °HOME CARE INSTRUCTIONS °You can prevent, stop, or reverse dental caries at home by practicing good oral hygiene. Good oral hygiene includes: °· Thoroughly cleaning your teeth at least twice a day with a toothbrush and dental floss.   °· Using a fluoride toothpaste. A fluoride mouth rinse may also be used if recommended by your dentist or health care provider.   °· Restricting the amount of sugary and starchy foods and sugary liquids you consume.   °· Avoiding frequent snacking on these foods and sipping of these liquids.   °· Keeping regular visits with   a dentist for checkups and cleanings. °PREVENTION  °· Practice good oral hygiene. °· Consider a dental sealant. A dental sealant is a coating material that is applied by your dentist to the pits and grooves of teeth. The sealant prevents food from being trapped in them. It may protect the teeth for several years. °· Ask about fluoride supplements if you live in a community without fluorinated water or with water that has a low fluoride content. Use fluoride supplements  as directed by your dentist or health care provider. °· Allow fluoride varnish applications to teeth if directed by your dentist or health care provider. °Document Released: 08/25/2002 Document Revised: 04/19/2014 Document Reviewed: 12/05/2012 °ExitCare® Patient Information ©2015 [employer]. This information is not intended to replace advice given to you by your health care provider. Make sure you discuss any questions you have with your health care provider. ° °Dental Pain °A tooth ache may be caused by cavities (tooth decay). Cavities expose the nerve of the tooth to air and hot or cold temperatures. It may come from an infection or abscess (also called a boil or furuncle) around your tooth. It is also often caused by dental caries (tooth decay). This causes the pain you are having. °DIAGNOSIS  °Your caregiver can diagnose this problem by exam. °TREATMENT  °· If caused by an infection, it may be treated with medications which kill germs (antibiotics) and pain medications as prescribed by your caregiver. Take medications as directed. °· Only take over-the-counter or prescription medicines for pain, discomfort, or fever as directed by your caregiver. °· Whether the tooth ache today is caused by infection or dental disease, you should see your dentist as soon as possible for further care. °SEEK MEDICAL CARE IF: °The exam and treatment you received today has been provided on an emergency basis only. This is not a substitute for complete medical or dental care. If your problem worsens or new problems (symptoms) appear, and you are unable to meet with your dentist, call or return to this location. °SEEK IMMEDIATE MEDICAL CARE IF:  °· You have a fever. °· You develop redness and swelling of your face, jaw, or neck. °· You are unable to open your mouth. °· You have severe pain uncontrolled by pain medicine. °MAKE SURE YOU:  °· Understand these instructions. °· Will watch your condition. °· Will get help right away if  you are not doing well or get worse. °Document Released: 12/03/2005 Document Revised: 02/25/2012 Document Reviewed: 07/21/2008 °ExitCare® Patient Information ©2015 [employer]. This information is not intended to replace advice given to you by your health care provider. Make sure you discuss any questions you have with your health care provider. ° °

## 2014-08-26 NOTE — ED Provider Notes (Signed)
Medical screening examination/treatment/procedure(s) were performed by non-physician practitioner and as supervising physician I was immediately available for consultation/collaboration.   EKG Interpretation None        Toy Cookey, MD 08/26/14 2019

## 2014-09-07 ENCOUNTER — Encounter: Payer: Self-pay | Admitting: Internal Medicine

## 2014-09-07 ENCOUNTER — Ambulatory Visit (INDEPENDENT_AMBULATORY_CARE_PROVIDER_SITE_OTHER): Payer: Medicare Other | Admitting: Internal Medicine

## 2014-09-07 VITALS — BP 142/68 | HR 89 | Temp 98.0°F | Resp 18 | Ht 71.0 in | Wt 181.2 lb

## 2014-09-07 DIAGNOSIS — M5440 Lumbago with sciatica, unspecified side: Secondary | ICD-10-CM

## 2014-09-07 DIAGNOSIS — E785 Hyperlipidemia, unspecified: Secondary | ICD-10-CM

## 2014-09-07 DIAGNOSIS — I10 Essential (primary) hypertension: Secondary | ICD-10-CM

## 2014-09-07 DIAGNOSIS — K7469 Other cirrhosis of liver: Secondary | ICD-10-CM

## 2014-09-07 DIAGNOSIS — K7460 Unspecified cirrhosis of liver: Secondary | ICD-10-CM

## 2014-09-07 DIAGNOSIS — M5430 Sciatica, unspecified side: Secondary | ICD-10-CM

## 2014-09-07 DIAGNOSIS — K029 Dental caries, unspecified: Secondary | ICD-10-CM

## 2014-09-07 DIAGNOSIS — G4700 Insomnia, unspecified: Secondary | ICD-10-CM

## 2014-09-07 MED ORDER — ZOLPIDEM TARTRATE 5 MG PO TABS: 5.0000 mg | ORAL_TABLET | Freq: Every evening | ORAL | Status: DC | PRN

## 2014-09-07 MED ORDER — OXYCODONE HCL 15 MG PO TABS: 15.0000 mg | ORAL_TABLET | Freq: Four times a day (QID) | ORAL | Status: DC | PRN

## 2014-09-07 MED ORDER — AMOXICILLIN 500 MG PO CAPS: 500.0000 mg | ORAL_CAPSULE | Freq: Three times a day (TID) | ORAL | Status: DC
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
## 2014-10-07 ENCOUNTER — Other Ambulatory Visit: Payer: Self-pay | Admitting: *Deleted

## 2014-10-07 MED ORDER — OXYCODONE HCL 15 MG PO TABS: 15.0000 mg | ORAL_TABLET | Freq: Four times a day (QID) | ORAL | Status: DC | PRN

## 2014-10-07 NOTE — Telephone Encounter (Signed)
Patient Requested and will pick up 

## 2014-11-05 ENCOUNTER — Other Ambulatory Visit: Payer: Self-pay | Admitting: *Deleted

## 2014-11-05 MED ORDER — OXYCODONE HCL 15 MG PO TABS
15.0000 mg | ORAL_TABLET | Freq: Four times a day (QID) | ORAL | Status: DC | PRN
Start: 2014-11-05 — End: 2014-12-02

## 2014-12-02 ENCOUNTER — Other Ambulatory Visit: Payer: Self-pay | Admitting: Internal Medicine

## 2014-12-02 MED ORDER — OXYCODONE HCL 15 MG PO TABS
15.0000 mg | ORAL_TABLET | Freq: Four times a day (QID) | ORAL | Status: DC | PRN
Start: 2014-12-02 — End: 2014-12-08

## 2014-12-02 NOTE — Telephone Encounter (Signed)
Patient Requested and will pick up 

## 2014-12-04 ENCOUNTER — Emergency Department (HOSPITAL_COMMUNITY): Payer: Medicare Other

## 2014-12-04 ENCOUNTER — Inpatient Hospital Stay (HOSPITAL_COMMUNITY)
Admission: EM | Admit: 2014-12-04 | Discharge: 2014-12-08 | DRG: 065 | Disposition: A | Discharge status: Skilled Nursing Facility | Payer: Medicare Other | Attending: Internal Medicine | Admitting: Internal Medicine

## 2014-12-04 ENCOUNTER — Encounter (HOSPITAL_COMMUNITY): Payer: Self-pay

## 2014-12-04 ENCOUNTER — Inpatient Hospital Stay (HOSPITAL_COMMUNITY): Payer: Medicare Other

## 2014-12-04 DIAGNOSIS — Z87891 Personal history of nicotine dependence: Secondary | ICD-10-CM | POA: Diagnosis not present

## 2014-12-04 DIAGNOSIS — G8191 Hemiplegia, unspecified affecting right dominant side: Secondary | ICD-10-CM | POA: Diagnosis present

## 2014-12-04 DIAGNOSIS — K5900 Constipation, unspecified: Secondary | ICD-10-CM | POA: Diagnosis present

## 2014-12-04 DIAGNOSIS — F1290 Cannabis use, unspecified, uncomplicated: Secondary | ICD-10-CM | POA: Diagnosis present

## 2014-12-04 DIAGNOSIS — M1990 Unspecified osteoarthritis, unspecified site: Secondary | ICD-10-CM | POA: Diagnosis present

## 2014-12-04 DIAGNOSIS — Z8673 Personal history of transient ischemic attack (TIA), and cerebral infarction without residual deficits: Secondary | ICD-10-CM

## 2014-12-04 DIAGNOSIS — E785 Hyperlipidemia, unspecified: Secondary | ICD-10-CM | POA: Diagnosis present

## 2014-12-04 DIAGNOSIS — M5137 Other intervertebral disc degeneration, lumbosacral region: Secondary | ICD-10-CM | POA: Diagnosis present

## 2014-12-04 DIAGNOSIS — F319 Bipolar disorder, unspecified: Secondary | ICD-10-CM | POA: Diagnosis present

## 2014-12-04 DIAGNOSIS — M159 Polyosteoarthritis, unspecified: Secondary | ICD-10-CM | POA: Diagnosis present

## 2014-12-04 DIAGNOSIS — M6289 Other specified disorders of muscle: Secondary | ICD-10-CM

## 2014-12-04 DIAGNOSIS — M545 Low back pain, unspecified: Secondary | ICD-10-CM | POA: Diagnosis present

## 2014-12-04 DIAGNOSIS — I6529 Occlusion and stenosis of unspecified carotid artery: Secondary | ICD-10-CM | POA: Diagnosis present

## 2014-12-04 DIAGNOSIS — H409 Unspecified glaucoma: Secondary | ICD-10-CM | POA: Diagnosis present

## 2014-12-04 DIAGNOSIS — I739 Peripheral vascular disease, unspecified: Secondary | ICD-10-CM | POA: Diagnosis present

## 2014-12-04 DIAGNOSIS — R4701 Aphasia: Secondary | ICD-10-CM | POA: Diagnosis present

## 2014-12-04 DIAGNOSIS — B182 Chronic viral hepatitis C: Secondary | ICD-10-CM | POA: Diagnosis present

## 2014-12-04 DIAGNOSIS — K219 Gastro-esophageal reflux disease without esophagitis: Secondary | ICD-10-CM | POA: Diagnosis present

## 2014-12-04 DIAGNOSIS — D72829 Elevated white blood cell count, unspecified: Secondary | ICD-10-CM | POA: Diagnosis present

## 2014-12-04 DIAGNOSIS — I639 Cerebral infarction, unspecified: Secondary | ICD-10-CM

## 2014-12-04 DIAGNOSIS — K7460 Unspecified cirrhosis of liver: Secondary | ICD-10-CM | POA: Diagnosis present

## 2014-12-04 DIAGNOSIS — Z7982 Long term (current) use of aspirin: Secondary | ICD-10-CM

## 2014-12-04 DIAGNOSIS — R112 Nausea with vomiting, unspecified: Secondary | ICD-10-CM

## 2014-12-04 DIAGNOSIS — I6339 Cerebral infarction due to thrombosis of other cerebral artery: Secondary | ICD-10-CM

## 2014-12-04 DIAGNOSIS — I10 Essential (primary) hypertension: Secondary | ICD-10-CM | POA: Diagnosis present

## 2014-12-04 DIAGNOSIS — R471 Dysarthria and anarthria: Secondary | ICD-10-CM | POA: Diagnosis present

## 2014-12-04 DIAGNOSIS — R29810 Facial weakness: Secondary | ICD-10-CM | POA: Diagnosis present

## 2014-12-04 DIAGNOSIS — Z823 Family history of stroke: Secondary | ICD-10-CM | POA: Diagnosis not present

## 2014-12-04 DIAGNOSIS — F324 Major depressive disorder, single episode, in partial remission: Secondary | ICD-10-CM

## 2014-12-04 DIAGNOSIS — G8929 Other chronic pain: Secondary | ICD-10-CM | POA: Diagnosis present

## 2014-12-04 DIAGNOSIS — R531 Weakness: Secondary | ICD-10-CM

## 2014-12-04 DIAGNOSIS — Z8249 Family history of ischemic heart disease and other diseases of the circulatory system: Secondary | ICD-10-CM | POA: Diagnosis not present

## 2014-12-04 DIAGNOSIS — F329 Major depressive disorder, single episode, unspecified: Secondary | ICD-10-CM | POA: Diagnosis present

## 2014-12-04 HISTORY — DX: Hyperlipidemia, unspecified: E78.5

## 2014-12-04 HISTORY — DX: Cerebral infarction, unspecified: I63.9

## 2014-12-04 LAB — BASIC METABOLIC PANEL
ANION GAP: 12 (ref 5–15)
BUN: 15 mg/dL (ref 6–23)
CHLORIDE: 102 meq/L (ref 96–112)
CO2: 24 meq/L (ref 19–32)
Calcium: 9.4 mg/dL (ref 8.4–10.5)
Creatinine, Ser: 0.81 mg/dL (ref 0.50–1.35)
GFR calc Af Amer: >90 mL/min (ref >90)
GFR calc non Af Amer: >90 mL/min (ref >90)
GLUCOSE: 109 mg/dL — AB (ref 70–99)
Potassium: 4 mEq/L (ref 3.7–5.3)
SODIUM: 138 meq/L (ref 137–147)

## 2014-12-04 LAB — CBC
HCT: 43.5 % (ref 39.0–52.0)
HEMATOCRIT: 43.2 % (ref 39.0–52.0)
HEMOGLOBIN: 14.7 g/dL (ref 13.0–17.0)
Hemoglobin: 15 g/dL (ref 13.0–17.0)
MCH: 30.8 pg (ref 26.0–34.0)
MCH: 30.5 pg (ref 26.0–34.0)
MCHC: 33.8 g/dL (ref 30.0–36.0)
MCHC: 34.7 g/dL (ref 30.0–36.0)
MCV: 91 fL (ref 78.0–100.0)
MCV: 87.8 fL (ref 78.0–100.0)
PLATELETS: 221 10*3/uL (ref 150–400)
Platelets: 214 10*3/uL (ref 150–400)
RBC: 4.78 MIL/uL (ref 4.22–5.81)
RBC: 4.92 MIL/uL (ref 4.22–5.81)
RDW: 13.6 % (ref 11.5–15.5)
RDW: 13.5 % (ref 11.5–15.5)
WBC: 9.4 10*3/uL (ref 4.0–10.5)
WBC: 10.8 10*3/uL — AB (ref 4.0–10.5)

## 2014-12-04 LAB — URINALYSIS, ROUTINE W REFLEX MICROSCOPIC
BILIRUBIN URINE: NEGATIVE
Glucose, UA: NEGATIVE mg/dL
HGB URINE DIPSTICK: NEGATIVE
KETONES UR: NEGATIVE mg/dL
Leukocytes, UA: NEGATIVE
Nitrite: NEGATIVE
PH: 6 (ref 5.0–8.0)
Protein, ur: NEGATIVE mg/dL
SPECIFIC GRAVITY, URINE: 1.015 (ref 1.005–1.030)
Urobilinogen, UA: 0.2 mg/dL (ref 0.0–1.0)

## 2014-12-04 LAB — CREATININE, SERUM
Creatinine, Ser: 0.85 mg/dL (ref 0.50–1.35)
GFR calc non Af Amer: >90 mL/min (ref >90)

## 2014-12-04 LAB — RAPID URINE DRUG SCREEN, HOSP PERFORMED
Amphetamines: NOT DETECTED
BENZODIAZEPINES: NOT DETECTED
Barbiturates: NOT DETECTED
COCAINE: NOT DETECTED
OPIATES: NOT DETECTED
Tetrahydrocannabinol: POSITIVE — AB

## 2014-12-04 LAB — I-STAT TROPONIN, ED: TROPONIN I, POC: 0 ng/mL (ref 0.00–0.08)

## 2014-12-04 MED ORDER — LATANOPROST 0.005 % OP SOLN
1.0000 [drp] | Freq: Every day | OPHTHALMIC | Status: DC
  Administered 2014-12-04 – 2014-12-07 (×4): 1 [drp] via OPHTHALMIC
  Filled 2014-12-04: qty 2.5

## 2014-12-04 MED ORDER — ZOLPIDEM TARTRATE 5 MG PO TABS
5.0000 mg | ORAL_TABLET | Freq: Every evening | ORAL | Status: DC | PRN
  Administered 2014-12-04: 5 mg via ORAL
  Filled 2014-12-04 (×2): qty 1

## 2014-12-04 MED ORDER — ASPIRIN 325 MG PO TABS
325.0000 mg | ORAL_TABLET | Freq: Every day | ORAL | Status: DC
  Administered 2014-12-05 – 2014-12-08 (×4): 325 mg via ORAL
  Filled 2014-12-04 (×4): qty 1

## 2014-12-04 MED ORDER — DOCUSATE SODIUM 100 MG PO CAPS
100.0000 mg | ORAL_CAPSULE | Freq: Two times a day (BID) | ORAL | Status: DC
  Administered 2014-12-04 – 2014-12-08 (×7): 100 mg via ORAL
  Filled 2014-12-04 (×7): qty 1

## 2014-12-04 MED ORDER — ACETAMINOPHEN 325 MG PO TABS: 650.0000 mg | ORAL_TABLET | ORAL | Status: DC | PRN

## 2014-12-04 MED ORDER — CLOTRIMAZOLE 1 % EX CREA
TOPICAL_CREAM | Freq: Two times a day (BID) | CUTANEOUS | Status: DC
  Administered 2014-12-04 – 2014-12-06 (×4): via TOPICAL
  Filled 2014-12-04: qty 15

## 2014-12-04 MED ORDER — PANTOPRAZOLE SODIUM 40 MG PO TBEC
40.0000 mg | DELAYED_RELEASE_TABLET | Freq: Every day | ORAL | Status: DC
  Administered 2014-12-07 – 2014-12-08 (×2): 40 mg via ORAL
  Filled 2014-12-04 (×3): qty 1

## 2014-12-04 MED ORDER — OXYCODONE HCL 5 MG PO TABS
15.0000 mg | ORAL_TABLET | Freq: Four times a day (QID) | ORAL | Status: DC | PRN
  Administered 2014-12-04 – 2014-12-08 (×8): 15 mg via ORAL
  Filled 2014-12-04 (×8): qty 3

## 2014-12-04 MED ORDER — ASPIRIN 81 MG PO CHEW
324.0000 mg | CHEWABLE_TABLET | Freq: Once | ORAL | Status: AC
  Administered 2014-12-04: 324 mg via ORAL
  Filled 2014-12-04: qty 4

## 2014-12-04 MED ORDER — ENOXAPARIN SODIUM 40 MG/0.4ML ~~LOC~~ SOLN
40.0000 mg | SUBCUTANEOUS | Status: DC
  Administered 2014-12-04 – 2014-12-07 (×4): 40 mg via SUBCUTANEOUS
  Filled 2014-12-04 (×4): qty 0.4

## 2014-12-04 MED ORDER — ASPIRIN 300 MG RE SUPP: 300.0000 mg | Freq: Every day | RECTAL | Status: DC

## 2014-12-04 MED ORDER — ONDANSETRON HCL 4 MG/2ML IJ SOLN
4.0000 mg | Freq: Four times a day (QID) | INTRAMUSCULAR | Status: DC | PRN
  Administered 2014-12-05: 4 mg via INTRAVENOUS
  Filled 2014-12-04: qty 2

## 2014-12-04 MED ORDER — SENNOSIDES-DOCUSATE SODIUM 8.6-50 MG PO TABS
1.0000 | ORAL_TABLET | Freq: Every evening | ORAL | Status: DC | PRN
  Administered 2014-12-06: 1 via ORAL
  Filled 2014-12-04: qty 1

## 2014-12-04 MED ORDER — STROKE: EARLY STAGES OF RECOVERY BOOK
Freq: Once | Status: AC
  Administered 2014-12-04: 17:00:00
  Filled 2014-12-04: qty 1

## 2014-12-04 MED ORDER — ACETAMINOPHEN 650 MG RE SUPP: 650.0000 mg | RECTAL | Status: DC | PRN

## 2014-12-04 MED ORDER — SODIUM CHLORIDE 0.9 % IV SOLN
INTRAVENOUS | Status: DC
  Administered 2014-12-04: 15:00:00 via INTRAVENOUS

## 2014-12-04 NOTE — Progress Notes (Signed)
Report recd from Darl PikesSusan, [facility]. Will monitor for pt's arrival to unit.   Andrew AuVafiadis, Gwynn Crossley I 12:53 PM 12/04/2014

## 2014-12-04 NOTE — ED Provider Notes (Signed)
CSN: [id number]     Arrival date & time 12/04/14  [id number] History   First MD Initiated Contact with Patient 12/04/14 [id number]     Chief Complaint  Patient presents with  . Stroke Symptoms     (Consider location/radiation/quality/duration/timing/severity/associated sxs/prior Treatment) HPI Comments: Went to bed normal around 7-8 pm. Woke up at 4 am with R sided weakness, difficulty ambulating. No change in symptoms since then.  Patient is a 58 y.o. male presenting with neurologic complaint. The history is provided by the patient.  Neurologic Problem This is a new problem. The current episode started 6 to 12 hours ago. The problem occurs constantly. The problem has not changed since onset.Pertinent negatives include no chest pain and no abdominal pain. Nothing aggravates the symptoms. Nothing relieves the symptoms.    Past Medical History  Diagnosis Date  . Hypertension   . Arthritis   . Back ache   . Urinary frequency   . Generalized osteoarthrosis, involving multiple sites   . Dermatophytosis of the body   . Chronic hepatitis C without mention of hepatic coma   . Bipolar I disorder, most recent episode (or current) unspecified   . Unspecified glaucoma   . Cirrhosis of liver without mention of alcohol   . Osteoarthrosis, unspecified whether generalized or localized, unspecified site   . Stroke     mini stroke   Past Surgical History  Procedure Laterality Date  . Penile prosthesis implant  04/12/2010    Dr.Grapey, Insertion of 3 peice penile prosthesis   . Anal fissure repair  2008  . Abcess drainage      Gluteal MRSA drainage   . Colonoscopy  2008    Dr.Edwards, Internal Hemorrhoids    Family History  Problem Relation Age of Onset  . Hypertension Mother   . Stroke Father   . Cancer Father     colon  . Diabetes Mother    History  Substance Use Topics  . Smoking status: Former Games developermoker  . Smokeless tobacco: Not on file  . Alcohol Use: Yes     Comment: denies    Review  of Systems  Constitutional: Negative for fever and chills.  Cardiovascular: Negative for chest pain.  Gastrointestinal: Negative for abdominal pain.  All other systems reviewed and are negative.     Allergies  Review of patient's allergies indicates no known allergies.  Home Medications   Prior to Admission medications   Medication Sig Start Date End Date Taking? Authorizing Provider  amoxicillin (AMOXIL) 500 MG capsule Take 1 capsule (500 mg total) by mouth 3 (three) times daily. 09/07/14   Mahima Pandey, MD  bimatoprost (LUMIGAN) 0.03 % ophthalmic drops Place 1 drop into both eyes at bedtime.     Historical Provider, MD  clotrimazole-betamethasone (LOTRISONE) cream Apply 1 application topically 2 (two) times daily.    Historical Provider, MD  hydrochlorothiazide (HYDRODIURIL) 50 MG tablet Take 50 mg by mouth daily.    Historical Provider, MD  hydrochlorothiazide (HYDRODIURIL) 50 MG tablet take 1 tablet by mouth once daily for blood pressure 12/02/14   Tiffany L Reed, DO  oxyCODONE (ROXICODONE) 15 MG immediate release tablet Take 1 tablet (15 mg total) by mouth every 6 (six) hours as needed for pain. 12/02/14   Tiffany L Reed, DO  simvastatin (ZOCOR) 10 MG tablet Take 10 mg by mouth daily.    Historical Provider, MD  zolpidem (AMBIEN) 5 MG tablet Take 1 tablet (5 mg total) by mouth at bedtime as needed for  sleep. 09/07/14   Mahima Glade LloydPandey, MD   BP 154/116 mmHg  Pulse 77  Temp(Src) 97.4 F (36.3 C) (Oral)  Resp 22  SpO2 100% Physical Exam  Constitutional: He is oriented to person, place, and time. He appears well-developed and well-nourished. No distress.  HENT:  Head: Normocephalic and atraumatic.  Mouth/Throat: No oropharyngeal exudate.  Eyes: EOM are normal. Pupils are equal, round, and reactive to light.  Neck: Normal range of motion. Neck supple.  Cardiovascular: Normal rate and regular rhythm.  Exam reveals no friction rub.   No murmur heard. Pulmonary/Chest: Effort normal  and breath sounds normal. No respiratory distress. He has no wheezes. He has no rales.  Abdominal: Soft. He exhibits no distension. There is no tenderness. There is no rebound.  Musculoskeletal: Normal range of motion. He exhibits no edema.  Neurological: He is alert and oriented to person, place, and time. Atrophy: R arm 4/5. A cranial nerve deficit (R facial weakness) is present. No sensory deficit. He exhibits abnormal muscle tone. Coordination (R arm, difficulty with movements, could be secondary to weakness) abnormal. GCS eye subscore is 4. GCS verbal subscore is 5. GCS motor subscore is 6.  Mild expressive aphasia and slurred speech  Skin: No rash noted. He is not diaphoretic.  Nursing note and vitals reviewed.   ED Course  Procedures (including critical care time) Labs Review Labs Reviewed  CBC  BASIC METABOLIC PANEL  URINE RAPID DRUG SCREEN (HOSP PERFORMED)  URINALYSIS, ROUTINE W REFLEX MICROSCOPIC  I-STAT TROPOININ, ED    Imaging Review Ct Head Wo Contrast  12/04/2014   CLINICAL DATA:  Right sided weakness and dysarthria ; right facial droop  EXAM: CT HEAD WITHOUT CONTRAST  TECHNIQUE: Contiguous axial images were obtained from the base of the skull through the vertex without intravenous contrast.  COMPARISON:  June 24, 2008  FINDINGS: The ventricles are normal in size and configuration. There is no intracranial mass, hemorrhage, extra-axial fluid collection, or midline shift. There is mild small vessel disease in the centra semiovale bilaterally. There is decreased attenuation in the anterior left putamen, concerning for a small acute infarct. There is a nearby small focus of decreased attenuation in the medial anterior left centrum semiovale which also may represent an acute small infarct. There is also a new focus of decreased attenuation in the lateral right dentate nucleus of the cerebellum. This area also could represent recent infarct.  Bony calvarium appears intact.  The mastoid  air cells are clear.  IMPRESSION: Mild periventricular small vessel disease. New decreased attenuation anterior left putamen and medial anterior inferior left centrum semiovale. There is also in a small focus of decreased attenuation in the lateral right dentate nucleus of the cerebellum. These areas concerning for potential small acute infarcts. There is no hemorrhage or mass effect.   Electronically Signed   By: Bretta BangWilliam  Woodruff M.D.   On: 12/04/2014 11:00     EKG Interpretation   Date/Time:  Saturday December 04 2014 10:01:48 EST Ventricular Rate:  63 PR Interval:  157 QRS Duration: 96 QT Interval:  419 QTC Calculation: 429 R Axis:   -55 Text Interpretation:  Sinus rhythm Probable left atrial enlargement Left  anterior fascicular block Abnormal R-wave progression, early transition  Confirmed by COOK  MD, BRIAN ([id number]) on 12/04/2014 10:43:41 AM      MDM   Final diagnoses:  Right sided weakness  Stroke  10:21 AM 40M here with R sided weakness, mild expressive aphasia, slurred speech. Hx of CVA  30 years ago, otherwise has HTN and chronic Hep C. Last known normal around 7-8 pm last night, awoke at 4 am with these symptoms, no change. Here with fiance, has R arm weakness, R facial weakness, slurred speech and mild expressive aphasia. Plan for labs, head CT, transfer to [facility] for evaluation by Neurology. CT shows stroke. Admitted to [facility].  Elwin Mocha, MD 12/04/14 [phone]
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
## 2014-12-04 NOTE — Progress Notes (Signed)
VASCULAR LAB PRELIMINARY  PRELIMINARY  PRELIMINARY  PRELIMINARY  Carotid Dopplers completed.    Preliminary report:  1-39% ICA stenosis.  Vertebral artery flow is antegrade.   Purl Claytor, RVT 12/04/2014, 3:31 PM

## 2014-12-04 NOTE — ED Notes (Signed)
Attempted to call report to [id number]. RN requests to be called back in 10 minutes to get report

## 2014-12-04 NOTE — Consult Note (Signed)
Referring Physician: Janee Morn    Chief Complaint: Right sided weakness  HPI: Nathan Buchanan is an 58 y.o. male who reports going to bed last evening and being in his normal state of health.  Awakened this morning and noted right sided weakness and difficulty with speech.  He was shaky and fell.  The patient does not describe numbness.  Symptoms did not improve. Presented to the ED outside of the time window for tPA.  Transferred to [facility] for further evaluation  Date last known well: Date: 12/04/2014 Time last known well: Time: 04:00 tPA Given: No: Outside time window  Past Medical History  Diagnosis Date  . Hypertension   . Arthritis   . Back ache   . Urinary frequency   . Generalized osteoarthrosis, involving multiple sites   . Dermatophytosis of the body   . Chronic hepatitis C without mention of hepatic coma   . Bipolar I disorder, most recent episode (or current) unspecified   . Unspecified glaucoma   . Cirrhosis of liver without mention of alcohol   . Osteoarthrosis, unspecified whether generalized or localized, unspecified site   . Stroke     mini stroke  . Hyperlipidemia     Past Surgical History  Procedure Laterality Date  . Penile prosthesis implant  04/12/2010    Dr.Grapey, Insertion of 3 peice penile prosthesis   . Anal fissure repair  2008  . Abcess drainage      Gluteal MRSA drainage   . Colonoscopy  2008    Dr.Edwards, Internal Hemorrhoids     Family History  Problem Relation Age of Onset  . Hypertension Mother   . Stroke Father   . Cancer Father     colon  . Diabetes Mother    Social History:  reports that he has quit smoking. He does not have any smokeless tobacco history on file. He reports that he drinks alcohol. He reports that he uses illicit drugs (Marijuana).  Allergies: No Known Allergies  Medications:  I have reviewed the patient's current medications. Prior to Admission:  Prescriptions prior to admission  Medication Sig Dispense Refill Last  Dose  . bimatoprost (LUMIGAN) 0.03 % ophthalmic drops Place 1 drop into both eyes at bedtime.    Past Month at Unknown time  . clotrimazole-betamethasone (LOTRISONE) cream Apply 1 application topically 2 (two) times daily.   12/03/2014 at Unknown time  . hydrochlorothiazide (HYDRODIURIL) 50 MG tablet Take 50 mg by mouth daily with breakfast.    12/04/2014 at Unknown time  . oxyCODONE (ROXICODONE) 15 MG immediate release tablet Take 1 tablet (15 mg total) by mouth every 6 (six) hours as needed for pain. (Patient taking differently: Take 15 mg by mouth 3 (three) times daily as needed for pain. ) 120 tablet 0 12/02/2014 at Unknown time  . zolpidem (AMBIEN) 5 MG tablet Take 1 tablet (5 mg total) by mouth at bedtime as needed for sleep. 15 tablet 1 Past Month at Unknown time  . amoxicillin (AMOXIL) 500 MG capsule Take 1 capsule (500 mg total) by mouth 3 (three) times daily. (Patient not taking: Reported on 12/04/2014) 21 capsule 0 Not Taking  . hydrochlorothiazide (HYDRODIURIL) 50 MG tablet take 1 tablet by mouth once daily for blood pressure (Patient not taking: Reported on 12/04/2014) 30 tablet 4 Not Taking at Unknown time   Scheduled: .  stroke: mapping our early stages of recovery book   Does not apply Once  . [START ON 12/05/2014] aspirin  300 mg Rectal Daily  Or  . [START ON 12/05/2014] aspirin  325 mg Oral Daily  . clotrimazole   Topical BID  . docusate sodium  100 mg Oral BID  . enoxaparin (LOVENOX) injection  40 mg Subcutaneous Q24H  . latanoprost  1 drop Both Eyes QHS  . [START ON 12/05/2014] pantoprazole  40 mg Oral Q0600    ROS: History obtained from the patient  General ROS: negative for - chills, fatigue, fever, night sweats, weight gain or weight loss Psychological ROS: negative for - behavioral disorder, hallucinations, memory difficulties, mood swings or suicidal ideation Ophthalmic ROS: negative for - blurry vision, double vision, eye pain or loss of vision ENT ROS: negative  for - epistaxis, nasal discharge, oral lesions, sore throat, tinnitus or vertigo Allergy and Immunology ROS: negative for - hives or itchy/watery eyes Hematological and Lymphatic ROS: negative for - bleeding problems, bruising or swollen lymph nodes Endocrine ROS: negative for - galactorrhea, hair pattern changes, polydipsia/polyuria or temperature intolerance Respiratory ROS: negative for - cough, hemoptysis, shortness of breath or wheezing Cardiovascular ROS: negative for - chest pain, dyspnea on exertion, edema or irregular heartbeat Gastrointestinal ROS: negative for - abdominal pain, diarrhea, hematemesis, nausea/vomiting or stool incontinence Genito-Urinary ROS: negative for - dysuria, hematuria, incontinence or urinary frequency/urgency Musculoskeletal ROS: negative for - joint swelling or muscular weakness Neurological ROS: as noted in HPI Dermatological ROS: negative for rash and skin lesion changes  Physical Examination: Blood pressure 163/89, pulse 81, temperature 98.6 F (37 C), temperature source Oral, resp. rate 18, height 5\' 11"  (1.803 m), weight 81.194 kg (179 lb), SpO2 100 %.  HEENT-  Normocephalic, no lesions, without obvious abnormality.  Normal external eye and conjunctiva.  Normal TM's bilaterally.  Normal auditory canals and external ears. Normal external nose, mucus membranes and septum.  Normal pharynx. Cardiovascular- S1, S2 normal, pulses palpable throughout   Lungs- chest clear, no wheezing, rales, normal symmetric air entry Abdomen- soft, non-tender; bowel sounds normal; no masses,  no organomegaly Extremities- no edema Lymph-no adenopathy palpable Musculoskeletal-no joint tenderness, deformity or swelling Skin-warm and dry, no hyperpigmentation, vitiligo, or suspicious lesions  Neurological Examination Mental Status: Alert, oriented, thought content appropriate.  Speech slurred and sparse but fluent.  Able to follow 3 step commands without difficulty. Cranial  Nerves: II: Discs flat bilaterally; Visual fields grossly normal, pupils equal, round, reactive to light and accommodation III,IV, VI: ptosis not present, extra-ocular motions intact bilaterally but seems to take great effort to look to the right and maintain that gaze V,VII: right facial droop, facial light touch sensation normal bilaterally VIII: hearing normal bilaterally IX,X: gag reflex reduced XI: bilateral shoulder shrug XII: midline tongue extension Motor: Right : Upper extremity   4+/5    Left:     Upper extremity   5/5  Lower extremity   4+/5     Lower extremity   5/5 Tone and bulk:normal tone throughout; no atrophy noted Sensory: Pinprick and light touch intact throughout, bilaterally Deep Tendon Reflexes: 2+ and symmetric in the upper extremities, 2+ at the left KJ, 1+ right KJ and left AJ.  Absent right AJ. Plantars: Right: downgoing   Left: downgoing Cerebellar: normal finger-to-nose and normal heel-to-shin testing bilaterally Gait: not tested due to safety concerns   Laboratory Studies:  Basic Metabolic Panel:  Recent Labs Lab 12/04/14 1024  NA 138  K 4.0  CL 102  CO2 24  GLUCOSE 109*  BUN 15  CREATININE 0.81  CALCIUM 9.4    Liver Function Tests: No  results for input(s): AST, ALT, ALKPHOS, BILITOT, PROT, ALBUMIN in the last 168 hours. No results for input(s): LIPASE, AMYLASE in the last 168 hours. No results for input(s): AMMONIA in the last 168 hours.  CBC:  Recent Labs Lab 12/04/14 1024  WBC 9.4  HGB 14.7  HCT 43.5  MCV 91.0  PLT 221    Cardiac Enzymes: No results for input(s): CKTOTAL, CKMB, CKMBINDEX, TROPONINI in the last 168 hours.  BNP: Invalid input(s): POCBNP  CBG: No results for input(s): GLUCAP in the last 168 hours.  Microbiology: Results for orders placed or performed during the hospital encounter of 05/23/07  Wound culture     Status: None   Collection Time: 05/23/07  5:35 PM  Result Value Ref Range Status   Specimen  Description WOUND BUTTOCKS RIGHT  Final   Special Requests NONE  Final   Gram Stain   Final    RARE WBC RARE SQUAMOUS EPITHELIAL CELLS PRESENT RARE GRAM POSITIVE COCCI IN CLUSTERS   Culture   Final    ABUNDANT METHICILLIN RESISTANT STAPHYLOCOCCUS AUREUS Note: RIFAMPIN AND GENTAMICIN SHOULD NOT BE USED AS SINGLE DRUGS FOR TREATMENT OF STAPH INFECTIONS. Contact laboratory if Clindamycin results needed. [phone] CRITICAL RESULT CALLED TO, READ BACK BY AND VERIFIED WITH: DR. CHARRON 06.09.08 10.30 BY  PERCN   Report Status 05/26/2007 FINAL  Final   Organism ID, Bacteria METHICILLIN RESISTANT STAPHYLOCOCCUS AUREUS  Final      Susceptibility   Methicillin resistant staphylococcus aureus - MIC    ERYTHROMYCIN >=8 Resistant     GENTAMICIN <=0.5 Sensitive     LEVOFLOXACIN 4 Intermediate     OXACILLIN >=4 Resistant     PENICILLIN >=0.5 Resistant     RIFAMPIN <=0.5 Sensitive     TRIMETH/SULFA <=10 Sensitive     VANCOMYCIN <=1 Sensitive     TETRACYCLINE <=1 Sensitive     Coagulation Studies: No results for input(s): LABPROT, INR in the last 72 hours.  Urinalysis:  Recent Labs Lab 12/04/14 1041  COLORURINE YELLOW  LABSPEC 1.015  PHURINE 6.0  GLUCOSEU NEGATIVE  HGBUR NEGATIVE  BILIRUBINUR NEGATIVE  KETONESUR NEGATIVE  PROTEINUR NEGATIVE  UROBILINOGEN 0.2  NITRITE NEGATIVE  LEUKOCYTESUR NEGATIVE    Lipid Panel:    Component Value Date/Time   CHOL 194 06/30/2009 1505   CHOL 194 06/30/2009 1505   TRIG 267* 10/30/2013 0936   HDL 31* 10/30/2013 0936   HDL 31* 06/30/2009 1505   HDL 31* 06/30/2009 1505   CHOLHDL 6.2* 10/30/2013 0936   CHOLHDL 6.3 Ratio 06/30/2009 1505   CHOLHDL 6.3 Ratio 06/30/2009 1505   VLDL 72* 06/30/2009 1505   VLDL 72* 06/30/2009 1505   LDLCALC 108* 10/30/2013 0936   LDLCALC 91 06/30/2009 1505   LDLCALC 91 06/30/2009 1505    HgbA1C: No results found for: HGBA1C  Urine Drug Screen:     Component Value Date/Time   LABOPIA NONE DETECTED 12/04/2014  1041   COCAINSCRNUR NONE DETECTED 12/04/2014 1041   COCAINSCRNUR NEG 08/25/2008 2130   LABBENZ NONE DETECTED 12/04/2014 1041   LABBENZ NEG 08/25/2008 2130   AMPHETMU NONE DETECTED 12/04/2014 1041   AMPHETMU NEG 08/25/2008 2130   THCU POSITIVE* 12/04/2014 1041   LABBARB NONE DETECTED 12/04/2014 1041    Alcohol Level: No results for input(s): ETH in the last 168 hours.  Other results: EKG: sinus rhythm at 63 bpm.  Imaging: Ct Head Wo Contrast  12/04/2014   CLINICAL DATA:  Right sided weakness and dysarthria ; right facial droop  EXAM: CT HEAD WITHOUT CONTRAST  TECHNIQUE: Contiguous axial images were obtained from the base of the skull through the vertex without intravenous contrast.  COMPARISON:  June 24, 2008  FINDINGS: The ventricles are normal in size and configuration. There is no intracranial mass, hemorrhage, extra-axial fluid collection, or midline shift. There is mild small vessel disease in the centra semiovale bilaterally. There is decreased attenuation in the anterior left putamen, concerning for a small acute infarct. There is a nearby small focus of decreased attenuation in the medial anterior left centrum semiovale which also may represent an acute small infarct. There is also a new focus of decreased attenuation in the lateral right dentate nucleus of the cerebellum. This area also could represent recent infarct.  Bony calvarium appears intact.  The mastoid air cells are clear.  IMPRESSION: Mild periventricular small vessel disease. New decreased attenuation anterior left putamen and medial anterior inferior left centrum semiovale. There is also in a small focus of decreased attenuation in the lateral right dentate nucleus of the cerebellum. These areas concerning for potential small acute infarcts. There is no hemorrhage or mass effect.   Electronically Signed   By: Bretta Bang M.D.   On: 12/04/2014 11:00    Assessment: 58 y.o. male presenting with right sided weakness and  slurred speech.  No sensory findings on neurological examination.  Head CT personally reviewed and shows areas of decreased attenuation in the left putamen and left centrum semiovale, along with right dentate nucleus.  Multiple stroke risk factors noted.  Patient on no antiplatelet therapy.       Stroke Risk Factors - hyperlipidemia and hypertension  Plan: 1. HgbA1c, fasting lipid panel 2. MRI of the brain unable to be performed secondary to shrapnel.  Would repeat head CT in 1-2 days 3. PT consult, OT consult, Speech consult 4. Echocardiogram 5. Carotid dopplers 6. Prophylactic therapy-Antiplatelet med: Aspirin - dose 325mg  daily 7. NPO until RN stroke swallow screen 8. Telemetry monitoring 9. Frequent neuro checks   Thana Farr, MD [facility] [phone] 12/04/2014, 3:57 PM

## 2014-12-04 NOTE — ED Notes (Addendum)
[facility] arrived to transport pt 

## 2014-12-04 NOTE — ED Notes (Signed)
Called [facility] to transport pt to [facility] 

## 2014-12-04 NOTE — ED Notes (Signed)
Per pt, woke up at 4 am with rt sided weakness and speech changes.  Pt presents now with slurred speech and rt side facial droop.  Rt hand and leg weakness with drift.  Pt had "small stroke" in past.  Md notified.  Last known normal is bedtime.

## 2014-12-04 NOTE — ED Notes (Signed)
Bed: WA06 Expected date:  Expected time:  Means of arrival:  Comments: 

## 2014-12-04 NOTE — Progress Notes (Signed)
Pt arrived to unit per stretcher by ambulance personnel. No acute distress noted. Assessment performed as charted.   Will monitor pt closely     Andrew AuVafiadis, Coley Littles I 12/04/2014 2:02 PM

## 2014-12-04 NOTE — ED Notes (Signed)
Pt and fiance notified of pt bed assignment and status of transfer

## 2014-12-04 NOTE — H&P (Signed)
[facility] History and Physical  Nathan Buchanan ZOX:[id number] DOB: [birth year] DOA: 12/04/2014  Referring physician: Dr. Gwendolyn Grant PCP: Oneal Grout, MD   Chief Complaint: Right-sided weakness/facial droop  HPI: Nathan Buchanan is a 58 y.o. male  With history of prior TIA in the 46s on 90s per patient, hypertension, hyperlipidemia, chronic hepatitis C, bipolar disorder who presents to the ED with sudden onset of right-sided weakness at 4 AM on the morning of admission. Patient and girlfriend state that patient was last seen normal when he went to bed around 7:30 to 8 PM the night prior to admission. Patient awoke around 4 AM on the day of admission chart ago to the bathroom and per fianc were shaking all over. When patient tried to get back into bed it was noted that his right side would not move and he subsequently fell. Per fianc patient was also noted to have some slurred speech some quivering of the lips, and left facial droop. Patient denies any numbness or tingling. Patient denies any visual changes. Patient denies any fever, no chills, no nausea, no vomiting, no abdominal pain, no chest pain, no shortness of breath, no diarrhea, no constipation, no dysuria, no melena, no hematemesis, no hematochezia. Patient does endorse some upper rest for his symptoms have been ongoing for the past 1-2 weeks however is improving. Patient was seen in the ED, basic metabolic profile done was unremarkable. CBC done was unremarkable. Urinalysis done was unremarkable. Urine toxicology screen done was positive for THC. EKG showed normal sinus rhythm with poor R-wave progression. CT head showed mild periventricular small vessel disease. New decreased attenuation anterior left putamen and medial antero-inferior left centrum semiovale ovale. Also small focus of decreased attenuation in the lateral right dentate nucleus of the cerebellum. These areas concerning for potential small acute infarcts. TPA not given  secondary to late presentation. [facility] were called to admit the patient for further evaluation and management.   Review of Systems: As per history of present illness otherwise negative. Constitutional:  No weight loss, night sweats, Fevers, chills, fatigue.  HEENT:  No headaches, Difficulty swallowing,Tooth/dental problems,Sore throat,  No sneezing, itching, ear ache, nasal congestion, post nasal drip,  Cardio-vascular:  No chest pain, Orthopnea, PND, swelling in lower extremities, anasarca, dizziness, palpitations  GI:  No heartburn, indigestion, abdominal pain, nausea, vomiting, diarrhea, change in bowel habits, loss of appetite  Resp:  No shortness of breath with exertion or at rest. No excess mucus, no productive cough, No non-productive cough, No coughing up of blood.No change in color of mucus.No wheezing.No chest wall deformity  Skin:  no rash or lesions.  GU:  no dysuria, change in color of urine, no urgency or frequency. No flank pain.  Musculoskeletal:  No joint pain or swelling. No decreased range of motion. No back pain.  Psych:  No change in mood or affect. No depression or anxiety. No memory loss.   Past Medical History  Diagnosis Date  . Hypertension   . Arthritis   . Back ache   . Urinary frequency   . Generalized osteoarthrosis, involving multiple sites   . Dermatophytosis of the body   . Chronic hepatitis C without mention of hepatic coma   . Bipolar I disorder, most recent episode (or current) unspecified   . Unspecified glaucoma   . Cirrhosis of liver without mention of alcohol   . Osteoarthrosis, unspecified whether generalized or localized, unspecified site   . Stroke     mini stroke  . Hyperlipidemia  Past Surgical History  Procedure Laterality Date  . Penile prosthesis implant  04/12/2010    Dr.Grapey, Insertion of 3 peice penile prosthesis   . Anal fissure repair  2008  . Abcess drainage      Gluteal MRSA drainage   . Colonoscopy   2008    Dr.Edwards, Internal Hemorrhoids    Social History:  reports that he has quit smoking. He does not have any smokeless tobacco history on file. He reports that he drinks alcohol. He reports that he uses illicit drugs (Marijuana).  No Known Allergies  Family History  Problem Relation Age of Onset  . Hypertension Mother   . Stroke Father   . Cancer Father     colon  . Diabetes Mother      Prior to Admission medications   Medication Sig Start Date End Date Taking? Authorizing Provider  bimatoprost (LUMIGAN) 0.03 % ophthalmic drops Place 1 drop into both eyes at bedtime.    Yes Historical Provider, MD  clotrimazole-betamethasone (LOTRISONE) cream Apply 1 application topically 2 (two) times daily.   Yes Historical Provider, MD  hydrochlorothiazide (HYDRODIURIL) 50 MG tablet Take 50 mg by mouth daily with breakfast.    Yes Historical Provider, MD  oxyCODONE (ROXICODONE) 15 MG immediate release tablet Take 1 tablet (15 mg total) by mouth every 6 (six) hours as needed for pain. Patient taking differently: Take 15 mg by mouth 3 (three) times daily as needed for pain.  12/02/14  Yes Tiffany L Reed, DO  zolpidem (AMBIEN) 5 MG tablet Take 1 tablet (5 mg total) by mouth at bedtime as needed for sleep. 09/07/14  Yes Mahima Pandey, MD  amoxicillin (AMOXIL) 500 MG capsule Take 1 capsule (500 mg total) by mouth 3 (three) times daily. Patient not taking: Reported on 12/04/2014 09/07/14   Oneal GroutMahima Pandey, MD  hydrochlorothiazide (HYDRODIURIL) 50 MG tablet take 1 tablet by mouth once daily for blood pressure Patient not taking: Reported on 12/04/2014 12/02/14   Kermit Baloiffany L Reed, DO   Physical Exam: Filed Vitals:   12/04/14 1003 12/04/14 1022 12/04/14 1042 12/04/14 1132  BP: 154/116  147/88 160/99  Pulse: 77  64 67  Temp: 97.4 F (36.3 C) 97.4 F (36.3 C)    TempSrc: Oral     Resp: 22  13 16   SpO2: 100%  96% 99%    Wt Readings from Last 3 Encounters:  09/07/14 82.192 kg (181 lb 3.2 oz)    04/06/14 84.823 kg (187 lb)  02/18/14 83.915 kg (185 lb)    General:  Well-developed well-nourished laying on gurney with an expressive aphasia and tearful in no acute cardiopulmonary distress.  Eyes: PERRLA, EOMI, normal lids, irises & conjunctiva ENT: grossly normal hearing, lips & tongue Neck: no LAD, masses or thyromegaly Cardiovascular: RRR, no m/r/g. No LE edema. Respiratory: CTA bilaterally, no w/r/r. Normal respiratory effort. Abdomen: soft, ntnd, positive bowel sounds, no rebound, no guarding Skin: no rash or induration seen on limited exam Musculoskeletal: grossly normal tone. 5 out of 5 left upper extremity and left lower extremity strength. 3-4/5 right upper extremity and right lower extremity strength. Psychiatric: grossly normal mood and affect, speech fluent and appropriate Neurologic: Alert and oriented 3. Patient with left facial droop. Patient with right upper extremity and right lower extremity weakness. Patient with abnormal right finger to nose. The rest of the cranial never exam was unremarkable. Sensation is intact. Visual fields are intact. 2-3+ bilateral patellar reflexes. 2-3+ bilateral brachioradialis reflexes. Unable to obtain Achilles reflexes.  Gait not tested secondary to safety.           Labs on Admission:  Basic Metabolic Panel:  Recent Labs Lab 12/04/14 1024  NA 138  K 4.0  CL 102  CO2 24  GLUCOSE 109*  BUN 15  CREATININE 0.81  CALCIUM 9.4   Liver Function Tests: No results for input(s): AST, ALT, ALKPHOS, BILITOT, PROT, ALBUMIN in the last 168 hours. No results for input(s): LIPASE, AMYLASE in the last 168 hours. No results for input(s): AMMONIA in the last 168 hours. CBC:  Recent Labs Lab 12/04/14 1024  WBC 9.4  HGB 14.7  HCT 43.5  MCV 91.0  PLT 221   Cardiac Enzymes: No results for input(s): CKTOTAL, CKMB, CKMBINDEX, TROPONINI in the last 168 hours.  BNP (last 3 results)  Recent Labs  02/18/14 2140  PROBNP 15.4    CBG: No results for input(s): GLUCAP in the last 168 hours.  Radiological Exams on Admission: Ct Head Wo Contrast  12/04/2014   CLINICAL DATA:  Right sided weakness and dysarthria ; right facial droop  EXAM: CT HEAD WITHOUT CONTRAST  TECHNIQUE: Contiguous axial images were obtained from the base of the skull through the vertex without intravenous contrast.  COMPARISON:  June 24, 2008  FINDINGS: The ventricles are normal in size and configuration. There is no intracranial mass, hemorrhage, extra-axial fluid collection, or midline shift. There is mild small vessel disease in the centra semiovale bilaterally. There is decreased attenuation in the anterior left putamen, concerning for a small acute infarct. There is a nearby small focus of decreased attenuation in the medial anterior left centrum semiovale which also may represent an acute small infarct. There is also a new focus of decreased attenuation in the lateral right dentate nucleus of the cerebellum. This area also could represent recent infarct.  Bony calvarium appears intact.  The mastoid air cells are clear.  IMPRESSION: Mild periventricular small vessel disease. New decreased attenuation anterior left putamen and medial anterior inferior left centrum semiovale. There is also in a small focus of decreased attenuation in the lateral right dentate nucleus of the cerebellum. These areas concerning for potential small acute infarcts. There is no hemorrhage or mass effect.   Electronically Signed   By: Bretta BangWilliam  Woodruff M.D.   On: 12/04/2014 11:00    EKG: Independently reviewed. Poor R-wave progression.  Assessment/Plan Principal Problem:   CVA (cerebral infarction) Active Problems:   Major depressive disorder, single episode   Essential hypertension   GERD   LOW BACK PAIN, CHRONIC   Bipolar affective disorder   Osteoarthritis   Stroke  #1 probable acute CVA/Right sided weakness Patient presented with sudden onset right-sided weakness  with expressive aphasia, slurred speech and some left facial weakness. CT of the head with new decreased attenuation concerning for small acute infarcts. Concern for embolic strokes. Will admit the patient to telemetry and undergo stroke workup. Check MRI/MRA of the head. Check carotid Dopplers. Check a 2-D echo. Check a fasting lipid panel. Check a hemoglobin A1c. PT/OT U/ST. Patient's blood pressure seems stable at this time and will hold patient's antihypertensive medications. Continue patient on Lipitor. Will place patient on full dose aspirin 325 mg daily for secondary prophylaxis. Consult with neurology for further evaluation and management.  #2 hypertension Stable. Hold blood pressure medications secondary to problem #1.  #3 hyperlipidemia Check a fasting lipid panel. Continue home dose Lipitor.  #4 gastroesophageal reflux disease PPI.  #5 bipolar affective disorder/major depressive disorder Stable. Follow.  #  6 chronic low back pain Continue home regimen of pain medications.  #7 prophylaxis PPI for GI prophylaxis. Lovenox for DVT prophylaxis.   Code Status: Full DVT Prophylaxis: Lovenox Family Communication: updated patient and fiancee and brother at bedside. Disposition Plan:Admit to telemetry  Time spent: 63 MINS  [facility] MD [facility] Pager [phone]
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
## 2014-12-05 ENCOUNTER — Inpatient Hospital Stay (HOSPITAL_COMMUNITY): Payer: Medicare Other

## 2014-12-05 DIAGNOSIS — M5442 Lumbago with sciatica, left side: Secondary | ICD-10-CM

## 2014-12-05 DIAGNOSIS — I6789 Other cerebrovascular disease: Secondary | ICD-10-CM

## 2014-12-05 DIAGNOSIS — K5909 Other constipation: Secondary | ICD-10-CM

## 2014-12-05 LAB — LIPID PANEL
Cholesterol: 194 mg/dL (ref 0–200)
HDL: 38 mg/dL — ABNORMAL LOW
LDL Cholesterol: 114 mg/dL — ABNORMAL HIGH (ref 0–99)
Total CHOL/HDL Ratio: 5.1 ratio
Triglycerides: 211 mg/dL — ABNORMAL HIGH
VLDL: 42 mg/dL — ABNORMAL HIGH (ref 0–40)

## 2014-12-05 LAB — GLUCOSE, CAPILLARY: Glucose-Capillary: 107 mg/dL — ABNORMAL HIGH (ref 70–99)

## 2014-12-05 LAB — HEMOGLOBIN A1C
Hgb A1c MFr Bld: 5.5 %
Mean Plasma Glucose: 111 mg/dL

## 2014-12-05 MED ORDER — SORBITOL 70 % SOLN
960.0000 mL | TOPICAL_OIL | Freq: Once | ORAL | Status: AC
  Administered 2014-12-05: 960 mL via RECTAL
  Filled 2014-12-05: qty 240

## 2014-12-05 MED ORDER — ATORVASTATIN CALCIUM 10 MG PO TABS
20.0000 mg | ORAL_TABLET | Freq: Every day | ORAL | Status: DC
  Administered 2014-12-05 – 2014-12-07 (×3): 20 mg via ORAL
  Filled 2014-12-05 (×3): qty 2

## 2014-12-05 MED ORDER — LINACLOTIDE 145 MCG PO CAPS
145.0000 ug | ORAL_CAPSULE | Freq: Every day | ORAL | Status: DC
  Administered 2014-12-05 – 2014-12-08 (×4): 145 ug via ORAL
  Filled 2014-12-05 (×4): qty 1

## 2014-12-05 NOTE — Progress Notes (Signed)
Utilization Review Completed.Nathan Buchanan T12/20/2015  

## 2014-12-05 NOTE — Plan of Care (Signed)
Problem: Acute Rehab PT Goals(only PT should resolve) Goal: Pt Will Go Supine/Side To Sit Min cues for safety and hand placement Goal: Pt Will Go Sit To Supine/Side Min cues for sequencing Goal: Patient Will Transfer Sit To/From [employer]Stand Min cues for hand placement Goal: Pt Will Transfer Bed To Chair/Chair To Bed Min cues for sequencing Goal: Pt Will Ambulate Min cues to attend to Rt

## 2014-12-05 NOTE — Progress Notes (Signed)
Pt had large amount of emesis after given 15 mg Oxy IR, pt attributes the nasuea/vomiting to taking the med without eating breakfast.  4 mg IV Zofran given, pt reports relief. Will monitor.

## 2014-12-05 NOTE — Progress Notes (Signed)
  Echocardiogram 2D Echocardiogram has been performed.  Delcie RochENNINGTON, Winthrop Shannahan 12/05/2014, 4:44 PM

## 2014-12-05 NOTE — Progress Notes (Signed)
STROKE TEAM PROGRESS NOTE   HISTORY Nathan Buchanan is an 58 y.o. male who reports going to bed last evening and being in his normal state of health. Awakened this morning and noted right sided weakness and difficulty with speech. He was shaky and fell. The patient does not describe numbness. Symptoms did not improve. Presented to the ED outside of the time window for tPA. [facility]  Date last known well: Date: 12/04/2014 Time last known well: Time: 04:00 tPA Given: No: Outside time window   SUBJECTIVE (INTERVAL HISTORY) Family at the bedside. Questions answered. The patient does not feel that he has improved so far.   OBJECTIVE Temp:  [97.4 F (36.3 C)-98.6 F (37 C)] 98 F (36.7 C) (12/20 0657) Pulse Rate:  [60-81] 65 (12/20 0657) Cardiac Rhythm:  [-] Normal sinus rhythm (12/20 0812) Resp:  [13-22] 18 (12/20 0657) BP: (130-163)/(79-116) 130/79 mmHg (12/20 0657) SpO2:  [93 %-100 %] 98 % (12/20 0657) Weight:  [179 lb (81.194 kg)] 179 lb (81.194 kg) (12/19 1500)   Recent Labs Lab 12/05/14 0534  GLUCAP 107*    Recent Labs Lab 12/04/14 1024 12/04/14 1648  NA 138  --   K 4.0  --   CL 102  --   CO2 24  --   GLUCOSE 109*  --   BUN 15  --   CREATININE 0.81 0.85  CALCIUM 9.4  --    No results for input(s): AST, ALT, ALKPHOS, BILITOT, PROT, ALBUMIN in the last 168 hours.  Recent Labs Lab 12/04/14 1024 12/04/14 1648  WBC 9.4 10.8*  HGB 14.7 15.0  HCT 43.5 43.2  MCV 91.0 87.8  PLT 221 214   No results for input(s): CKTOTAL, CKMB, CKMBINDEX, TROPONINI in the last 168 hours. No results for input(s): LABPROT, INR in the last 72 hours.  Recent Labs  12/04/14 1041  COLORURINE YELLOW  LABSPEC 1.015  PHURINE 6.0  GLUCOSEU NEGATIVE  HGBUR NEGATIVE  BILIRUBINUR NEGATIVE  KETONESUR NEGATIVE  PROTEINUR NEGATIVE  UROBILINOGEN 0.2  NITRITE NEGATIVE  LEUKOCYTESUR NEGATIVE       Component Value Date/Time   CHOL 194 12/05/2014 0406    TRIG 211* 12/05/2014 0406   HDL 38* 12/05/2014 0406   HDL 31* 10/30/2013 0936   CHOLHDL 5.1 12/05/2014 0406   CHOLHDL 6.2* 10/30/2013 0936   VLDL 42* 12/05/2014 0406   LDLCALC 114* 12/05/2014 0406   LDLCALC 108* 10/30/2013 0936   No results found for: HGBA1C    Component Value Date/Time   LABOPIA NONE DETECTED 12/04/2014 1041   COCAINSCRNUR NONE DETECTED 12/04/2014 1041   COCAINSCRNUR NEG 08/25/2008 2130   LABBENZ NONE DETECTED 12/04/2014 1041   LABBENZ NEG 08/25/2008 2130   AMPHETMU NONE DETECTED 12/04/2014 1041   AMPHETMU NEG 08/25/2008 2130   THCU POSITIVE* 12/04/2014 1041   LABBARB NONE DETECTED 12/04/2014 1041    No results for input(s): ETH in the last 168 hours.  Dg Chest 2 View 12/05/2014    No acute cardiopulmonary process seen.     Ct Head Wo Contrast 12/04/2014    Mild periventricular small vessel disease. New decreased attenuation anterior left putamen and medial anterior inferior left centrum semiovale. There is also in a small focus of decreased attenuation in the lateral right dentate nucleus of the cerebellum. These areas concerning for potential small acute infarcts. There is no hemorrhage or mass effect.       PHYSICAL EXAM Mental Status: Alert, oriented, thought content appropriate. Very  dysarthric but no aphasia. Able to follow 3 step commands without difficulty. Cranial Nerves: II: Visual fields grossly normal, pupils equal, round, reactive to light and accommodation III,IV, VI: ptosis not present, extra-ocular motions intact bilaterally V,VII: right facial droop, facial light touch sensation normal bilaterally VIII: hearing normal bilaterally IX,X: gag reflex reduced XI: bilateral shoulder shrug XII: midline tongue extension Motor: Right :Upper extremity 2-3/5Left: Upper extremity 5/5 Lower extremity 4+/5Lower  extremity 5/5 Tone and bulk:normal tone throughout; no atrophy noted Sensory: Pinprick and light touch intact throughout, bilaterally Cerebellar: normal finger-to-nose and normal heel-to-shin testing     ASSESSMENT/PLAN Mr. Nathan Buchanan is a 58 y.o. male with history of hypertension, chronic hepatitis C, bipolar disorder, previous stroke, hyperlipidemia, and cirrhosis of the liver presenting with right-sided weakness and speech difficulties. He did not receive IV t-PA due to being outside window for therapeutic treatment.   Strokes:  Dominant - infarcts possibly embolic embolic from an unknown source.  Resultant  right hemiparesis with right facial droop and dysarthria.  MRI - Unable to be performed secondary to indwelling shrapnel. Repeat head CT Monday vs CTA.  MRA - as above  Carotid Doppler pending  2D Echo pending  LDL 114  HgbA1c pending  Lovenox for VTE prophylaxis  Diet Heart with thin liquids  no antithrombotic prior to admission, now on aspirin 325 mg orally every day  Patient counseled to be compliant with his antithrombotic medications  Ongoing aggressive stroke risk factor management  Therapy recommendations: Pending  Disposition:  Pending  Hypertension  Home meds:   Hydrochlorothiazide 50 mg daily  Stable   Hyperlipidemia  Home meds:  No cholesterol lowering medications prior to admission  LDL 114, goal < 70  Add Lipitor  Continue statin at discharge    Other Stroke Risk Factors  ETOH use  Hx stroke/TIA  Family hx stroke (father)   Other Active Problems  Mild leukocytosis  Positive UDS - THCU  Other Pertinent History    Hospital day # 1  Delton Seeavid Rinehuls PA-C [facility] Pager [phone] 12/05/2014, 8:26 AM  Hx of Hep C. Was nto on any anti plt at home.  Pauletta BrownsZEYLIKMAN, Koy Lamp    To contact Stroke Continuity provider, please refer to [email or web address]. After hours, contact General Neurology

## 2014-12-05 NOTE — Evaluation (Signed)
Occupational Therapy Evaluation Patient Details Name: Nathan Buchanan MRN: [id number] DOB: June 09, 1956 Today's Date: 12/05/2014    History of Present Illness Patient is a 58 yo married male with acute onset of difficulty during mobility. Patient's wife reported 2 falls at home and she was unable to assist him up.  He came to hospital with Rt side weakness, confirmed Lt putamen CVA.  PMH also includes HTN, Arthtritis, back pain,  chronic Hep C, bipolar disorder, glaucoma, cirrhosis and hperlipidemia.   Clinical Impression   Pt admitted with above. Pt requiring assist with LB dressing, PTA. Feel pt will benefit from acute OT to increase independence with BADLs and address RUE deficits prior to d/c. Recommending CIR and feel pt is a great candidate.    Follow Up Recommendations  CIR    Equipment Recommendations  Other (comment) (defer to next venue)    Recommendations for Other Services       Precautions / Restrictions Precautions Precautions: Fall Restrictions Weight Bearing Restrictions: No      Mobility Bed Mobility   General bed mobility comments: not assessed  Transfers Overall transfer level: Needs assistance Equipment used: Rolling walker (2 wheeled) Transfers: Sit to/from Stand Sit to Stand: Max assist;Mod assist       General transfer comment: assist to boost and also for hand placement.    Balance Overall balance assessment: Needs assistance     Standing balance support: During functional activity Standing balance-Leahy Scale: Fair Standing balance comment: Min A for balance at sink during grooming tasks                            ADL Overall ADL's : Needs assistance/impaired Eating/Feeding: Minimal assistance;Sitting   Grooming: Wash/dry hands;Wash/dry face;Oral care;Applying deodorant;Moderate assistance;Sitting;Standing   Upper Body Bathing: Minimal assitance;Sitting   Lower Body Bathing: Sit to/from stand;Moderate assistance   Upper  Body Dressing : Moderate assistance;Sitting   Lower Body Dressing: Maximal assistance;Sit to/from stand   Toilet Transfer: Minimal assistance;Moderate assistance;Ambulation;RW;BSC   Toileting- ArchitectClothing Manipulation and Hygiene: Sit to/from stand;Moderate assistance       Functional mobility during ADLs: Minimal assistance;Moderate assistance;Maximal assistance;Rolling walker General ADL Comments: Educated on dressing technique. Recommended pt be using RUE for activities. Positioned Rt arm on pillow at end of session. Pt performed grooming at sink with assist for balance-cues for positioning of feet. Discussed CIR.      Vision  Pt wears glasses all the time; reports no change from baseline    Visual fields: No apparent deficits   Tracking/Visual Pursuits: Impaired - to be further tested in functional context (Pt with great difficulty tracking when tested)              Perception     Praxis      Pertinent Vitals/Pain Pain Assessment: No/denies pain     Hand Dominance Right   Extremity/Trunk Assessment Upper Extremity Assessment Upper Extremity Assessment: RUE deficits/detail RUE Deficits / Details: weak grasp; 2+/5 shoulder flexion; biceps 4/5 RUE Coordination: decreased fine motor   Lower Extremity Assessment Lower Extremity Assessment: Defer to PT evaluation RLE Deficits / Details: 4/5 hip flexion, 5/5 knee ext, 4+/5 ankle DF RLE Sensation: decreased proprioception RLE Coordination: decreased fine motor   Cervical / Trunk Assessment Cervical / Trunk Assessment: Normal   Communication Communication Communication: Other (comment) (low voice volume)   Cognition Arousal/Alertness: Awake/alert Behavior During Therapy: WFL for tasks assessed/performed Overall Cognitive Status: Impaired/Different from baseline Area of Impairment: Following  commands       Following Commands: Follows one step commands inconsistently (very minimal, if any tracking, when testing  visual tracking with pt)           General Comments          Shoulder Instructions      Home Living Family/patient expects to be discharged to:: Private residence Living Arrangements:  (children ages 7318,17,15, and 58 yo) Available Help at Discharge: Family;Available 24 hours/day Type of Home: House Home Access: Stairs to enter;Ramped entrance Entrance Stairs-Number of Steps: 2 front entrance, ramped back entry Entrance Stairs-Rails: Right Home Layout: One level               Home Equipment: None          Prior Functioning/Environment Level of Independence: Needs assistance    ADL's / Homemaking Assistance Needed: assist with LB dressing   Comments: retired    OT Diagnosis: Other (comment)   OT Problem List: Decreased strength;Impaired balance (sitting and/or standing);Decreased knowledge of use of DME or AE;Decreased knowledge of precautions;Decreased cognition;Impaired vision/perception;Decreased coordination;Pain;Impaired UE functional use   OT Treatment/Interventions: Self-care/ADL training;Therapeutic exercise;DME and/or AE instruction;Neuromuscular education;Therapeutic activities;Cognitive remediation/compensation;Visual/perceptual remediation/compensation;Patient/family education;Balance training    OT Goals(Current goals can be found in the care plan section) Acute Rehab OT Goals Patient Stated Goal: not stated OT Goal Formulation: With patient Time For Goal Achievement: 12/12/14 Potential to Achieve Goals: Good ADL Goals Pt Will Perform Grooming: with min guard assist;standing Pt Will Perform Upper Body Dressing: sitting;with min assist Pt Will Perform Lower Body Dressing: sit to/from stand;with min assist Pt Will Transfer to Toilet: with min assist;ambulating Pt Will Perform Toileting - Clothing Manipulation and hygiene: sit to/from stand;with min guard assist;sitting/lateral leans Additional ADL Goal #1: Pt will be independent with HEP for RUE to  increase strength.  OT Frequency: Min 2X/week   Barriers to D/C:            Co-evaluation              End of Session Equipment Utilized During Treatment: Gait belt;Rolling walker Nurse Communication: Mobility status  Activity Tolerance: Patient tolerated treatment well Patient left: in chair;with call bell/phone within reach;with family/visitor present   Time: 1009-1036 OT Time Calculation (min): 27 min Charges:  OT General Charges $OT Visit: 1 Procedure OT Evaluation $Initial OT Evaluation Tier I: 1 Procedure OT Treatments $Self Care/Home Management : 8-22 mins G-CodesEarlie Raveling:    Dulcemaria Bula L OTR/L [phone] 12/05/2014, 1:30 PM
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
## 2014-12-05 NOTE — Evaluation (Signed)
Physical Therapy Evaluation Patient Details Name: Nathan Buchanan MRN: [id number] DOB: 1956/01/27 Today's Date: 12/05/2014   History of Present Illness  Patient is a 58 yo married male with acute onset of difficulty during mobility. Patient's wife reported 2 falls at home and she was unable to assist him up.  He came to hospital with Rt side weakness, confirmed Lt putamen CVA.  PMH also includes HTN, Arthtritis, back pain,  chronic Hep C, bipolar disorder, glaucoma, cirrhosis and hperlipidemia.  Clinical Impression  Patient with acute CVA presenting with Rt inattention/neglect, difficulty with mobility and strength dominant side.  Patient demonstrates quick fatigue with mobility, is at fall risk secondary to decr proprioception and Rt inattention.  Vertical/horizontal orientation WNL; peripheral vision appears WNL upon initial screening although difficulty with shifting gaze upwards and to Rt. Sits and stands with Lt neck rotation, needing cues to look Rt. Patient may benefit from ongoing skilled PT on this venue of care to assist with discharge planning and to regain functional mobility to improve independence.    Follow Up Recommendations CIR;Supervision/Assistance - 24 hour;Home health PT    Equipment Recommendations  Rolling walker with 5" wheels;3in1 (PT)    Recommendations for Other Services Rehab consult     Precautions / Restrictions Precautions Precautions: Fall Precaution Comments: Rt inattention Restrictions Weight Bearing Restrictions: No      Mobility  Bed Mobility Overal bed mobility: Needs Assistance Bed Mobility: Rolling;Sidelying to Sit Rolling: Min assist (to Rt) Sidelying to sit: Mod assist (mod cues for hand placement/sequencing)       General bed mobility comments: gets OOB on Rt at home  Transfers Overall transfer level: Needs assistance Equipment used: Rolling walker (2 wheeled) Transfers: Sit to/from UGI CorporationStand;Stand Pivot Transfers Sit to Stand: Mod  assist (mod cues for hand placement) Stand pivot transfers: Mod assist (mod cues for sequencing)       General transfer comment: poor Rt foot placement during pivot transfer  Ambulation/Gait Ambulation/Gait assistance: Mod assist Ambulation Distance (Feet): 25 Feet Assistive device: Rolling walker (2 wheeled) Gait Pattern/deviations: Step-to pattern;Decreased step length - right;Decreased stance time - right;Decreased stride length;Decreased dorsiflexion - right;Trunk flexed        Stairs            Wheelchair Mobility    Modified Rankin (Stroke Patients Only)       Balance Overall balance assessment: Needs assistance;History of Falls Sitting-balance support: No upper extremity supported;Feet unsupported Sitting balance-Leahy Scale: Good     Standing balance support: No upper extremity supported (30 sec EO supervison, immediate LOB to Rt with EC) Standing balance-Leahy Scale: Fair                               Pertinent Vitals/Pain Pain Assessment: No/denies pain    Home Living Family/patient expects to be discharged to:: Private residence Living Arrangements: Spouse/significant other;Children (children ages 5718, 2917, 7015 and 58 yo) Available Help at Discharge: Family;Available 24 hours/day Type of Home: House Home Access: Stairs to enter;Ramped entrance Entrance Stairs-Rails: Right Entrance Stairs-Number of Steps: 2 front entrance, ramped back entry Home Layout: One level Home Equipment: None      Prior Function Level of Independence: Independent         Comments: retired     Higher education careers adviserHand Dominance   Dominant Hand: Right    Extremity/Trunk Assessment   Upper Extremity Assessment: Defer to OT evaluation;Generalized weakness (partial AROM Rt shoulder flexion, abduction, slow opposition)  Lower Extremity Assessment: RLE deficits/detail RLE Deficits / Details: 4/5 hip flexion, 5/5 knee ext, 4+/5 ankle DF    Cervical / Trunk  Assessment: Normal  Communication   Communication: Other (comment) (low voice volume)  Cognition Arousal/Alertness: Awake/alert Behavior During Therapy: WFL for tasks assessed/performed Overall Cognitive Status: Within Functional Limits for tasks assessed                      General Comments      Exercises Other Exercises Other Exercises: ankle pumps x 10 Other Exercises: AAROM shoulder flexion x 10      Assessment/Plan    PT Assessment Patient needs continued PT services  PT Diagnosis Difficulty walking;Abnormality of gait;Hemiplegia dominant side   PT Problem List Decreased strength;Decreased activity tolerance;Decreased balance;Decreased mobility;Decreased coordination;Decreased knowledge of use of DME;Decreased safety awareness;Impaired sensation  PT Treatment Interventions DME instruction;Gait training;Stair training;Functional mobility training;Therapeutic activities;Therapeutic exercise;Balance training;Neuromuscular re-education;Patient/family education;Manual techniques   PT Goals (Current goals can be found in the Care Plan section) Acute Rehab PT Goals Patient Stated Goal: regain strength PT Goal Formulation: With patient/family Time For Goal Achievement: 12/11/14 Potential to Achieve Goals: Good    Frequency Min 3X/week   Barriers to discharge        Co-evaluation               End of Session Equipment Utilized During Treatment: Gait belt Activity Tolerance: Patient limited by fatigue;Patient limited by lethargy Patient left: in chair;with call bell/phone within reach;with family/visitor present Nurse Communication: Mobility status;Precautions         Time: [id number] PT Time Calculation (min) (ACUTE ONLY): 36 min   Charges:   PT Evaluation $Initial PT Evaluation Tier I: 1 Procedure PT Treatments $Gait Training: 8-22 mins   PT G CodesNestor Buchanan:        Janeen Watson M Barney Gertsch, PT [phone]  Dwaine Pringle 12/05/2014, 10:49 AM

## 2014-12-06 ENCOUNTER — Encounter (HOSPITAL_COMMUNITY): Payer: Self-pay | Admitting: Radiology

## 2014-12-06 ENCOUNTER — Inpatient Hospital Stay (HOSPITAL_COMMUNITY): Payer: Medicare Other

## 2014-12-06 DIAGNOSIS — I63311 Cerebral infarction due to thrombosis of right middle cerebral artery: Secondary | ICD-10-CM

## 2014-12-06 DIAGNOSIS — I69920 Aphasia following unspecified cerebrovascular disease: Secondary | ICD-10-CM

## 2014-12-06 DIAGNOSIS — G8190 Hemiplegia, unspecified affecting unspecified side: Secondary | ICD-10-CM

## 2014-12-06 MED ORDER — IOHEXOL 350 MG/ML SOLN
50.0000 mL | Freq: Once | INTRAVENOUS | Status: AC | PRN
  Administered 2014-12-06: 50 mL via INTRAVENOUS

## 2014-12-06 MED ORDER — GUAIFENESIN 100 MG/5ML PO SYRP
200.0000 mg | ORAL_SOLUTION | ORAL | Status: DC | PRN
  Administered 2014-12-06 – 2014-12-08 (×6): 200 mg via ORAL
  Filled 2014-12-06 (×8): qty 10

## 2014-12-06 NOTE — Progress Notes (Signed)
Occupational Therapy Treatment Patient Details Name: Nathan Buchanan MRN: [id number] DOB: June 22, 1956 Today's Date: 12/06/2014    History of present illness Patient is a 58 yo married male with acute onset of difficulty during mobility. Patient's wife reported 2 falls at home and she was unable to assist him up.  He came to hospital with Rt side weakness, confirmed Lt putamen CVA.  PMH also includes HTN, Arthtritis, back pain,  chronic Hep C, bipolar disorder, glaucoma, cirrhosis and hperlipidemia.   OT comments  Pt progressing and motivated to work with therapy. Continue to recommend CIR for rehab and feel pt is excellent candidate.  Follow Up Recommendations  CIR    Equipment Recommendations  Other (comment) (defer to next venue)    Recommendations for Other Services      Precautions / Restrictions Precautions Precautions: Fall Restrictions Weight Bearing Restrictions: No       Mobility Bed Mobility               General bed mobility comments: not assessed  Transfers Overall transfer level: Needs assistance Equipment used: Rolling walker (2 wheeled) Transfers: Sit to/from Stand Sit to Stand: Mod assist         General transfer comment: assist to boost and to position RUE on walker    Balance                                   ADL Overall ADL's : Needs assistance/impaired     Grooming: Oral care;Wash/dry face;Applying deodorant;Standing;Moderate assistance Grooming Details (indicate cue type and reason): Min A for balance- assist to have pt use Rt hand             Lower Body Dressing: Moderate assistance;Sitting/lateral leans (socks)   Toilet Transfer: Moderate assistance;Ambulation;RW (chair)           Functional mobility during ADLs: Moderate assistance;Rolling walker General ADL Comments: Pt ambulated to sink to perform ADLs-continued to encourage pt to use Rt hand. Placed RUE on pillow at end of session.  Suggested crossing  legs for LB dressing-OT assisted in holding Right leg and also assisted with socks.      Vision                     Perception     Praxis      Cognition  Awake/Alert Behavior During Therapy: Flat affect  Overall Cognitive Status: Within Functional Limits for tasks assessed                       Extremity/Trunk Assessment               Exercises     Shoulder Instructions       General Comments      Pertinent Vitals/ Pain       Pain Assessment: No/denies pain  Home Living                                          Prior Functioning/Environment              Frequency Min 2X/week     Progress Toward Goals  OT Goals(current goals can now be found in the care plan section)  Progress towards OT goals: Progressing toward goals  Acute Rehab OT Goals Patient Stated  Goal: not stated OT Goal Formulation: With patient Time For Goal Achievement: 12/12/14 Potential to Achieve Goals: Good ADL Goals Pt Will Perform Grooming: with min guard assist;standing Pt Will Perform Upper Body Dressing: sitting;with min assist Pt Will Perform Lower Body Dressing: sit to/from stand;with min assist Pt Will Transfer to Toilet: with min assist;ambulating Pt Will Perform Toileting - Clothing Manipulation and hygiene: sit to/from stand;with min guard assist;sitting/lateral leans Additional ADL Goal #1: Pt will be independent with HEP for RUE to increase strength.  Plan Discharge plan remains appropriate    Co-evaluation                 End of Session Equipment Utilized During Treatment: Gait belt;Rolling walker   Activity Tolerance Patient tolerated treatment well   Patient Left in chair;with call bell/phone within reach;with family/visitor present   Nurse Communication Other (comment) (saw pt working with OT)        Time: 8502-7741: 1722-1739 OT Time Calculation (min): 17 min  Charges: OT General Charges $OT Visit: 1 Procedure OT  Treatments $Self Care/Home Management : 8-22 mins  Nathan Buchanan, Nathan Buchanan OTR/Buchanan [phone] 12/06/2014, 6:21 PM
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
## 2014-12-06 NOTE — Progress Notes (Signed)
Physical Therapy Treatment Patient Details Name: Donell Sievertdolph Shackleford MRN: [id number] DOB: [birth year] Today's Date: 12/06/2014    History of Present Illness Patient is a 58 yo married male with acute onset of difficulty during mobility. Patient's wife reported 2 falls at home and she was unable to assist him up.  He came to hospital with Rt side weakness, confirmed Lt putamen CVA.  PMH also includes HTN, Arthtritis, back pain,  chronic Hep C, bipolar disorder, glaucoma, cirrhosis and hperlipidemia.    PT Comments    Patient making really good progress this session and was able to work on R LE today with ambulation. Patient is highly motivated and had to be encouraged to rest for his safety. Patient stated that the chair had been uncomfortable and his bottom was sore, therefore placed pillow in chair and positioned LE for comfort as well. Patient stated that he also does not like stayin in the bed. Continue to recommend comprehensive inpatient rehab (CIR) for post-acute therapy needs.   Follow Up Recommendations  CIR;Supervision/Assistance - 24 hour     Equipment Recommendations  Rolling walker with 5" wheels;3in1 (PT)    Recommendations for Other Services       Precautions / Restrictions Precautions Precautions: Fall Precaution Comments: Rt inattention but improved with cueing    Mobility  Bed Mobility               General bed mobility comments: not assessed  Transfers Overall transfer level: Needs assistance Equipment used: 1 person hand held assist Transfers: Sit to/from Stand Sit to Stand: Min assist;Mod assist         General transfer comment: Varying min to mod A with multiple stands throughout session. Patient cued for R hand positioning and to push up with L. Patient able to postion his R LE well prior to standing. Cues to control descent into the reclienr  Ambulation/Gait Ambulation/Gait assistance: Mod assist Ambulation Distance (Feet): 25 Feet  (x4) Assistive device: 1 person hand held assist (rail in hallway) Gait Pattern/deviations: Step-to pattern;Decreased step length - right;Decreased dorsiflexion - right Gait velocity: decreased Gait velocity interpretation: Below normal speed for age/gender General Gait Details: Patient able to use rail in hallway well and focus on R LE placement. Cues for patient to increase step length and to widen BOS by stepping R leg out. WIth increassed fatigue, Patient with less clearance of R LE.    Stairs            Wheelchair Mobility    Modified Rankin (Stroke Patients Only) Modified Rankin (Stroke Patients Only) Pre-Morbid Rankin Score: No symptoms Modified Rankin: Moderately severe disability     Balance                                    Cognition Arousal/Alertness: Awake/alert Behavior During Therapy: WFL for tasks assessed/performed Overall Cognitive Status: Within Functional Limits for tasks assessed                      Exercises      General Comments        Pertinent Vitals/Pain Pain Assessment: No/denies pain    Home Living     Available Help at Discharge: Family;Available 24 hours/day Type of Home: House              Prior Function            PT Goals (current  goals can now be found in the care plan section) Progress towards PT goals: Progressing toward goals    Frequency  Min 4X/week    PT Plan Current plan remains appropriate    Co-evaluation             End of Session Equipment Utilized During Treatment: Gait belt Activity Tolerance: Patient tolerated treatment well Patient left: in chair;with call bell/phone within reach;with chair alarm set     Time: [id number] PT Time Calculation (min) (ACUTE ONLY): 27 min  Charges:  $Gait Training: 8-22 mins $Therapeutic Activity: 8-22 mins                    G Codes:      Fredrich BirksRobinette, Shemia Bevel Elizabeth 12/06/2014, 2:15 PM 12/06/2014 Fredrich Birksobinette, Charvi Gammage Elizabeth  PTA [phone] pager [phone] office

## 2014-12-06 NOTE — Progress Notes (Signed)
Rehab Admissions Coordinator Note:  Patient was screened by Clois DupesBoyette, Farrel Guimond Godwin for appropriateness for an Inpatient Acute Rehab Consult per PT and OT recommendation.  At this time, we are recommending await completion of rehab consult completion today.Clois Dupes.  Markevion Lattin Godwin 12/06/2014, 8:43 AM  I can be reached at [phone].
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
## 2014-12-06 NOTE — Evaluation (Signed)
Speech Language Pathology Evaluation Patient Details Name: Donell Sievertdolph Davidoff MRN: [id number] DOB: June 16, 1956 Today's Date: 12/06/2014 Time: [id number] SLP Time Calculation (min) (ACUTE ONLY): 29 min  Problem List:  Patient Active Problem List   Diagnosis Date Noted  . Stroke 12/04/2014  . CVA (cerebral infarction) 12/04/2014  . Hyperlipidemia   . Other and unspecified hyperlipidemia 01/06/2014  . Glaucoma 01/06/2014  . Routine general medical examination at a health care facility 01/06/2014  . Left anterior fascicular block 01/06/2014  . Eczema 03/25/2013  . Rash 11/02/2010  . Bipolar affective disorder 10/02/2010  . Alcohol abuse 10/02/2010  . Glaucoma (increased eye pressure) 10/02/2010  . Cirrhosis 10/02/2010  . Osteoarthritis 10/02/2010  . Polyarthritis or polyarthropathy of hand 10/02/2010  . Personal history of tobacco use, presenting hazards to health 10/02/2010  . GLAUCOMA 07/06/2009  . HAND PAIN, RIGHT 06/30/2009  . INSOMNIA 06/30/2009  . LEUKOCYTOSIS 05/25/2009  . NARCOTIC ABUSE 05/20/2008  . PROTEINURIA 08/19/2007  . Enlargement of Lymph Nodes 06/26/2007  . Major depressive disorder, single episode 06/11/2007  . ABSCESS, GLUTEAL 05/23/2007  . DEGENERATIVE JOINT DISEASE 04/21/2007  . BOILS, RECURRENT 04/09/2007  . HEPATITIS C 12/29/2006  . ERECTILE DYSFUNCTION 11/06/2006  . Essential hypertension 11/06/2006  . GERD 11/06/2006  . LOW BACK PAIN, CHRONIC 11/06/2006   Past Medical History:  Past Medical History  Diagnosis Date  . Hypertension   . Arthritis   . Back ache   . Urinary frequency   . Generalized osteoarthrosis, involving multiple sites   . Dermatophytosis of the body   . Chronic hepatitis C without mention of hepatic coma   . Bipolar I disorder, most recent episode (or current) unspecified   . Unspecified glaucoma   . Cirrhosis of liver without mention of alcohol   . Osteoarthrosis, unspecified whether generalized or localized, unspecified site    . Stroke     mini stroke  . Hyperlipidemia    Past Surgical History:  Past Surgical History  Procedure Laterality Date  . Penile prosthesis implant  04/12/2010    Dr.Grapey, Insertion of 3 peice penile prosthesis   . Anal fissure repair  2008  . Abcess drainage      Gluteal MRSA drainage   . Colonoscopy  2008    Dr.Edwards, Internal Hemorrhoids    HPI:  Patient is a 58 yo married male with acute onset of difficulty during mobility. Patient's wife reported 2 falls at home and she was unable to assist him up. He came to hospital with Rt side weakness, confirmed Lt putamen CVA. PMH also includes HTN, Arthtritis, back pain, chronic Hep C, bipolar disorder, glaucoma, cirrhosis and hyperlipidemia.    Assessment / Plan / Recommendation Clinical Impression  Pt demonstrates impairments in the areas of basic to mildly complex problem solving, selective attention, right sided attention, working memory, and storage of new information. Decreased emergent awareness also impacts pt's ability to monitor and correct errors from impairments above. SLP provided Mod multimodal cueing for selective attention and working memory for patient to accurately complete structured, mildly complex tasks as well as basic functional tasks. Pt also has a mild dysarthria secondary to imprecise articulation, hoarse vocal quality, and low vocal intensity. Pt will benefit from acute SLP services as well as CIR level therapies.    SLP Assessment  Patient needs continued Speech Lanaguage Pathology Services    Follow Up Recommendations  Inpatient Rehab;24 hour supervision/assistance    Frequency and Duration min 2x/week  2 weeks   Pertinent Vitals/Pain Pain  Assessment: No/denies pain   SLP Goals  Patient/Family Stated Goal: none stated Potential to Achieve Goals (ACUTE ONLY): Good Potential Considerations (ACUTE ONLY): Ability to learn/carryover information  SLP Evaluation Prior Functioning  Cognitive/Linguistic  Baseline: Within functional limits Type of Home: House  Lives With: Spouse;Other (Comment) (4 children) Available Help at Discharge: Family;Available 24 hours/day   Cognition  Overall Cognitive Status: Impaired/Different from baseline Arousal/Alertness: Awake/alert Orientation Level: Oriented X4 Attention: Selective Selective Attention: Impaired Selective Attention Impairment: Verbal basic;Functional basic Memory: Impaired Memory Impairment: Storage deficit;Retrieval deficit;Decreased recall of new information;Other (comment) (working memory) Awareness: Impaired Awareness Impairment: Intellectual impairment;Emergent impairment;Anticipatory impairment Problem Solving: Impaired Problem Solving Impairment: Functional basic Safety/Judgment: Appears intact    Comprehension  Auditory Comprehension Overall Auditory Comprehension: Impaired Yes/No Questions: Impaired Paragraph Comprehension (via yes/no questions): 51-75% accurate Commands: Within Functional Limits Conversation: Simple Interfering Components: Attention;Working Theatre managermemory Visual Recognition/Discrimination Discrimination: Within [employer]inoisFunction Limits Reading Comprehension Reading Status: Impaired Interfering Components: Visual scanning;Right neglect/inattention    Expression Expression Primary Mode of Expression: Verbal Verbal Expression Overall Verbal Expression: Appears within functional limits for tasks assessed Written Expression Dominant Hand: Right Written Expression: Not tested   Oral / Motor Motor Speech Overall Motor Speech: Impaired Respiration: Within functional limits Phonation: Hoarse;Low vocal intensity Resonance: Within functional limits Articulation: Impaired Level of Impairment: Conversation Intelligibility: Intelligible Motor Planning: Witnin functional limits Motor Speech Errors: Not applicable   GO      Maxcine HamLaura Paiewonsky, M.A. CCC-SLP [phone]  Maxcine Hamaiewonsky, Jeferson Boozer 12/06/2014, 11:31  AM
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
## 2014-12-07 DIAGNOSIS — F320 Major depressive disorder, single episode, mild: Secondary | ICD-10-CM

## 2014-12-07 MED ORDER — POLYETHYLENE GLYCOL 3350 17 G PO PACK
17.0000 g | PACK | Freq: Every day | ORAL | Status: DC
  Administered 2014-12-07 – 2014-12-08 (×2): 17 g via ORAL
  Filled 2014-12-07 (×2): qty 1

## 2014-12-07 NOTE — Clinical Social Work Psychosocial (Signed)
Clinical Social Work Department BRIEF PSYCHOSOCIAL ASSESSMENT 12/07/2014  Patient:  [facility]     Account Number:  [id number]     Admit date:  12/04/2014  Clinical Social Worker:  Glendon Axe, CLINICAL SOCIAL WORKER  Date/Time:  12/07/2014 11:06 AM  Referred by:  Physician  Date Referred:  12/07/2014 Referred for  SNF Placement   Other Referral:   Interview type:  Patient Other interview type:    PSYCHOSOCIAL DATA Living Status:  SIGNIFICANT OTHER Admitted from facility:   Level of care:   Primary support name:  Charna Archer [phone] Primary support relationship to patient:  SPOUSE Degree of support available:   Strong    CURRENT CONCERNS Current Concerns  Post-Acute Placement   Other Concerns:    SOCIAL WORK ASSESSMENT / PLAN Clinical Social Worker met with patient at bedside in reference to post-acute placement for SNF. CSW explained role and SNF process. CSW also provided SNF list to review with patient. CSW presented SNF option as a alternative plan for CIR. Pt reported he is agreeable to SNF if he can not be admitted into CIR. Pt further stated " I just want to get better, I am willing to go to any rehab". Pt stated he would discuss SNF option with his girlfriend, Bethena Roys. CSW will fax pt's clinicals to SNF's in [facility] and continue to follow pt for disposition.   Assessment/plan status:  Psychosocial Support/Ongoing Assessment of Needs Other assessment/ plan:   Information/referral to community resources:   SNF information/ list provided.    PATIENT'S/FAMILY'S RESPONSE TO PLAN OF CARE: Pt alert and oriented and sitting at bedside watching tv. Pt pleasant and reported he "wants to get moving around again". Pt is agreeable SNF placement.     Glendon Axe, MSW, LCSWA [phone] 12/07/2014 11:30 AM

## 2014-12-07 NOTE — Progress Notes (Signed)
[facility] PROGRESS NOTE  Donell Sievertdolph Spellman ZOX:[id number] DOB: 1956-10-13 DOA: 12/04/2014 PCP: Oneal GroutPANDEY, MAHIMA, MD  Assessment/Plan: 1. Possible L sided CVA 1. 2d echo unremarkable with EF of 65-70% 2. Carotid dopplers pending 3. Neurology following 4. Given hx of shrapnel, cannot obtain MRI 5. CTA head confirms acute infarct of L post limb of internal capsule and centrum semiovale 6. 1cm hypodensity in the R cerebellum seen and is consistent with infarct of indeterminate age, likely chronic 7. Pt has residual R sided weakness and facial droop 8. Continue aspirin 9. PT/OT recs thus far for CIR. Consulted - pending disposition 2. HTN 1. Stable 2. Allowing permissive htn for now given concerns of acute CVA 3. HLD 1. On lipitor 20mg  4. GERD 1. On PPI qday 5. Depression 1. Seems stable at present 6. Constipation 1. N/v today with food 2. Pt has hx of chronic pain med use for chronic back pain. 3. Recently resolved with SMOG enema and linzess  4. No bowel movements noted over the past 2 days. Will start daily miralax 7. DVT prophylaxis 1. lovenox subQ  Code Status: Full Family Communication: Pt in room Disposition Plan: Possible CIR   Consultants:    Procedures:    Antibiotics:  none  HPI/Subjective: No complaints. No bowel movements in the past 2 days. Eager to start therapy  Objective: Filed Vitals:   12/06/14 1849 12/06/14 2205 12/07/14 0635 12/07/14 1034  BP: 132/93 130/76 139/87 149/85  Pulse: 79 79 77 81  Temp: 98.5 F (36.9 C) 97.7 F (36.5 C) 98.4 F (36.9 C) 97.3 F (36.3 C)  TempSrc: Oral Axillary Oral Oral  Resp: 20 22 24 20   Height:      Weight:      SpO2: 97% 94% 98% 96%    Intake/Output Summary (Last 24 hours) at 12/07/14 1329 Last data filed at 12/07/14 0900  Gross per 24 hour  Intake    480 ml  Output    525 ml  Net    -45 ml   Filed Weights   12/04/14 1500  Weight: 81.194 kg (179 lb)    Exam:   General:  Awake, in  nad  Cardiovascular: regular, s1, s2  Respiratory: normal resp effort, no wheezing  Abdomen: distended, decreased BS, generally tender  Musculoskeletal: perfused, no clubbing   Data Reviewed: Basic Metabolic Panel:  Recent Labs Lab 12/04/14 1024 12/04/14 1648  NA 138  --   K 4.0  --   CL 102  --   CO2 24  --   GLUCOSE 109*  --   BUN 15  --   CREATININE 0.81 0.85  CALCIUM 9.4  --    Liver Function Tests: No results for input(s): AST, ALT, ALKPHOS, BILITOT, PROT, ALBUMIN in the last 168 hours. No results for input(s): LIPASE, AMYLASE in the last 168 hours. No results for input(s): AMMONIA in the last 168 hours. CBC:  Recent Labs Lab 12/04/14 1024 12/04/14 1648  WBC 9.4 10.8*  HGB 14.7 15.0  HCT 43.5 43.2  MCV 91.0 87.8  PLT 221 214   Cardiac Enzymes: No results for input(s): CKTOTAL, CKMB, CKMBINDEX, TROPONINI in the last 168 hours. BNP (last 3 results)  Recent Labs  02/18/14 2140  PROBNP 15.4   CBG:  Recent Labs Lab 12/05/14 0534  GLUCAP 107*    No results found for this or any previous visit (from the past 240 hour(s)).   Studies: Ct Angio Head W/cm &/or Wo Cm  12/06/2014  CLINICAL DATA:  Stroke.  Right-sided weakness.  EXAM: CT ANGIOGRAPHY HEAD  TECHNIQUE: Multidetector CT imaging of the head was performed using the standard protocol during bolus administration of intravenous contrast. Multiplanar CT image reconstructions and MIPs were obtained to evaluate the vascular anatomy.  CONTRAST:  50mL OMNIPAQUE IOHEXOL 350 MG/ML SOLN  COMPARISON:  CT head 12/04/2014  FINDINGS: Interval development of a hypodensity in the left posterior limb internal capsule and centrum semiovale. This is most consistent with acute infarct.  8 mm hypodensity left anterior putamen is unchanged and appears chronic. 1 cm hypodensity in the right cerebellum unchanged is consistent with of infarct of indeterminate age. This was not present on the CT of 2009. Negative for  hemorrhage or mass. Ventricle size is normal. No enhancing lesions are seen.  Left vertebral artery is dominant and widely patent. Right vertebral artery nondominant with a small contribution to the basilar. PICA patent bilaterally. Basilar is relatively small with fetal origin of the posterior cerebral artery bilaterally. Superior cerebellar artery is patent bilaterally.  Internal carotid artery widely patent. Anterior and middle cerebral arteries widely patent. No significant stenosis or filling defect identified  Negative for cerebral aneurysm.  Review of the MIP images confirms the above findings.  IMPRESSION: Acute infarct in the left posterior limb internal capsule and centrum semiovale. This has developed since the prior CT of 12/04/2014.  1 cm hypodensity right cerebellum consistent with infarct of indeterminate age, likely chronic  Negative CTA head.   Electronically Signed   By: Marlan Palauharles  Clark M.D.   On: 12/06/2014 07:16    Scheduled Meds: . aspirin  300 mg Rectal Daily   Or  . aspirin  325 mg Oral Daily  . atorvastatin  20 mg Oral q1800  . clotrimazole   Topical BID  . docusate sodium  100 mg Oral BID  . enoxaparin (LOVENOX) injection  40 mg Subcutaneous Q24H  . latanoprost  1 drop Both Eyes QHS  . Linaclotide  145 mcg Oral Daily  . pantoprazole  40 mg Oral Q0600  . polyethylene glycol  17 g Oral Daily   Continuous Infusions: . sodium chloride 75 mL/hr at 12/04/14 1500    Principal Problem:   CVA (cerebral infarction) Active Problems:   Major depressive disorder, single episode   Essential hypertension   GERD   LOW BACK PAIN, CHRONIC   Bipolar affective disorder   Osteoarthritis   Stroke  Time spent: 30min  CHIU, STEPHEN K  [facility] Pager [phone]. If 7PM-7AM, please contact night-coverage at [email or web address], password [email or web address] 12/07/2014, 1:29 PM  LOS: 3 days

## 2014-12-07 NOTE — Progress Notes (Signed)
Rehab admissions - Evaluated for possible admission.  I spoke with patient.  I explained the co-pays for [employer] to come to rehab is $345 for days 1-5.  Patient does not want to come to inpatient rehab because of the out of pocket co-pays.  He wishes to pursue SNF placement.  I will let social worker and case manager know of patient decision.  Call me for questions.  #[phone]

## 2014-12-07 NOTE — Clinical Social Work Placement (Addendum)
Clinical Social Work Department CLINICAL SOCIAL WORK PLACEMENT NOTE 12/07/2014  Patient:  Nathan Buchanan,Nathan Buchanan  Account Number:  [id number] Admit date:  12/04/2014  Clinical Social Worker:  Derenda FennelBASHIRA Poonam Woehrle, CLINICAL SOCIAL WORKER  Date/time:  12/07/2014 11:31 AM  Clinical Social Work is seeking post-discharge placement for this patient at the following level of care:   SKILLED NURSING   (*CSW will update this form in Epic as items are completed)   12/07/2014  [facility]/family provided with [facility] of Clinical Social Work's list of facilities offering this level of care within the geographic area requested by the patient (or if unable, by the patient's family).  12/07/2014  Patient/family informed of their freedom to choose among providers that offer the needed level of care, that participate in Medicare, Medicaid or managed care program needed by the patient, have an available bed and are willing to accept the patient.  12/07/2014  Patient/family informed of [facility]' ownership interest in [facility], as well as of the fact that they are under no obligation to receive care at this facility.  PASARR submitted to EDS on PENDING   PASARR number received on PENDING    FL2 transmitted to all facilities in geographic area requested by pt/family on  12/08/2014 FL2 transmitted to all facilities within larger geographic area on 12/08/2014   Patient informed that his/her managed care company has contracts with or will negotiate with  certain facilities, including the following:  YES   Patient/family informed of bed offers received:  12/08/2014 [facility] Physician recommends and patient chooses bed at    [facility]  on  12/08/2014 Patient to be transferred to facility by [facility] Patient and family notified of transfer on 12/08/2014 Name of family member notified: Pt's significant other, Darel HongJudy    The following physician request were entered in Epic:   Additional Comments:   Derenda FennelBashira Ryla Cauthon, MSW, LCSWA [phone] 12/07/2014 11:33 AM

## 2014-12-07 NOTE — Progress Notes (Signed)
Physical Therapy Treatment Patient Details Name: Nathan Buchanan MRN: [id number] DOB: 1956-05-14 Today's Date: 12/07/2014    History of Present Illness Patient is a [age] yo married male with acute onset of difficulty during mobility. Patient's wife reported 2 falls at home and she was unable to assist him up.  He came to hospital with Rt side weakness, confirmed Lt putamen CVA.  PMH also includes HTN, Arthtritis, back pain,  chronic Hep C, bipolar disorder, glaucoma, cirrhosis and hperlipidemia.    PT Comments    Patient continues to be highly motivated and progressing with therapy this morning. His fiance was present and able to see his progressing. They are both hopeful that he can transfer to CIR. Continue to recommend comprehensive inpatient rehab (CIR) for post-acute therapy needs.   Follow Up Recommendations  CIR;Supervision/Assistance - 24 hour     Equipment Recommendations  Rolling walker with 5" wheels;3in1 (PT)    Recommendations for Other Services       Precautions / Restrictions Precautions Precautions: Fall Precaution Comments: Rt inattention but improved with cueing    Mobility  Bed Mobility               General bed mobility comments: not assessed  Transfers Overall transfer level: Needs assistance Equipment used: Rolling walker (2 wheeled)   Sit to Stand: Min assist         General transfer comment: Min A to power up and to maintain upright balance. Cues for safe positioning and hand placement and awareness of R side placement  Ambulation/Gait Ambulation/Gait assistance: Mod assist Ambulation Distance (Feet): 60 Feet (x4) Assistive device: 1 person hand held assist (rail in hall) Gait Pattern/deviations: Step-to pattern;Decreased step length - right;Decreased weight shift to left;Decreased dorsiflexion - right Gait velocity: decreased Gait velocity interpretation: Below normal speed for age/gender General Gait Details: Patient able to use rail  in hallway well and focus on R LE placement. Cues for patient to increase step length and to widen BOS by stepping R leg out. Worked on R sidestepping x5870ft with A for R LE stance and positioning   Stairs            Wheelchair Mobility    Modified Rankin (Stroke Patients Only) Modified Rankin (Stroke Patients Only) Pre-Morbid Rankin Score: No symptoms Modified Rankin: Moderately severe disability     Balance                                    Cognition Arousal/Alertness: Awake/alert Behavior During Therapy: WFL for tasks assessed/performed Overall Cognitive Status: Within Functional Limits for tasks assessed                      Exercises      General Comments        Pertinent Vitals/Pain Pain Assessment: No/denies pain    Home Living                      Prior Function            PT Goals (current goals can now be found in the care plan section) Progress towards PT goals: Progressing toward goals    Frequency  Min 4X/week    PT Plan Current plan remains appropriate    Co-evaluation             End of Session Equipment Utilized During Treatment: Gait belt Activity  Tolerance: Patient tolerated treatment well Patient left: in chair;with call bell/phone within reach;with chair alarm set     Time: [phone] PT Time Calculation (min) (ACUTE ONLY): 27 min  Charges:  $Gait Training: 23-37 mins                    G Codes:      Fredrich BirksRobinette, Nakul Avino Elizabeth 12/07/2014, 8:30 AM 12/07/2014 Fredrich Birksobinette, Haynes Giannotti Elizabeth PTA [phone] pager [phone] office

## 2014-12-08 MED ORDER — ASPIRIN EC 325 MG PO TBEC: 325.0000 mg | DELAYED_RELEASE_TABLET | Freq: Every day | ORAL | Status: AC

## 2014-12-08 MED ORDER — ACETAMINOPHEN 325 MG PO TABS: 650.0000 mg | ORAL_TABLET | ORAL | Status: DC | PRN

## 2014-12-08 MED ORDER — OXYCODONE HCL 15 MG PO TABS: 15.0000 mg | ORAL_TABLET | Freq: Four times a day (QID) | ORAL | Status: DC | PRN

## 2014-12-08 MED ORDER — ATORVASTATIN CALCIUM 20 MG PO TABS: 20.0000 mg | ORAL_TABLET | Freq: Every day | ORAL | Status: DC

## 2014-12-08 MED ORDER — POLYETHYLENE GLYCOL 3350 17 G PO PACK: 17.0000 g | PACK | Freq: Every day | ORAL | Status: DC

## 2014-12-08 MED ORDER — DSS 100 MG PO CAPS: 100.0000 mg | ORAL_CAPSULE | Freq: Two times a day (BID) | ORAL | Status: AC

## 2014-12-08 MED ORDER — DSS 100 MG PO CAPS: 100.0000 mg | ORAL_CAPSULE | Freq: Two times a day (BID) | ORAL | Status: DC | PRN

## 2014-12-08 NOTE — Progress Notes (Signed)
Pt discharge information discussed with pt and significant other. Pt waiting for [facility] to [facility]. Will continue to monitor.

## 2014-12-08 NOTE — Progress Notes (Signed)
[facility] to [facility]. All questions answered.

## 2014-12-08 NOTE — Discharge Instructions (Signed)
Ischemic Stroke °A stroke (cerebrovascular accident) is the sudden death of brain tissue. It is a medical emergency. A stroke can cause permanent loss of brain function. This can cause problems with different parts of your body. A transient ischemic attack (TIA) is different because it does not cause permanent damage. A TIA is a short-lived problem of poor blood flow affecting a part of the brain. A TIA is also a serious problem because having a TIA greatly increases the chances of having a stroke. When symptoms first develop, you cannot know if the problem might be a stroke or a TIA. °CAUSES  °A stroke is caused by a decrease of oxygen supply to an area of your brain. It is usually the result of a small blood clot or collection of cholesterol or fat (plaque) that blocks blood flow in the brain. A stroke can also be caused by blocked or damaged carotid arteries.  °RISK FACTORS °· High blood pressure (hypertension). °· High cholesterol. °· Diabetes mellitus. °· Heart disease. °· The buildup of plaque in the blood vessels (peripheral artery disease or atherosclerosis). °· The buildup of plaque in the blood vessels providing blood and oxygen to the brain (carotid artery stenosis). °· An abnormal heart rhythm (atrial fibrillation). °· Obesity. °· Smoking. °· Taking oral contraceptives (especially in combination with smoking). °· Physical inactivity. °· A diet high in fats, salt (sodium), and calories. °· Alcohol use. °· Use of illegal drugs (especially cocaine and methamphetamine). °· Being African American. °· Being over the age of 55. °· Family history of stroke. °· Previous history of blood clots, stroke, TIA, or heart attack. °· Sickle cell disease. °SYMPTOMS  °These symptoms usually develop suddenly, or may be newly present upon awakening from sleep: °· Sudden weakness or numbness of the face, arm, or leg, especially on one side of the body. °· Sudden trouble walking or difficulty moving arms or legs. °· Sudden  confusion. °· Sudden personality changes. °· Trouble speaking (aphasia) or understanding. °· Difficulty swallowing. °· Sudden trouble seeing in one or both eyes. °· Double vision. °· Dizziness. °· Loss of balance or coordination. °· Sudden severe headache with no known cause. °· Trouble reading or writing. °DIAGNOSIS  °Your health care provider can often determine the presence or absence of a stroke based on your symptoms, history, and physical exam. Computed tomography (CT) of the brain is usually performed to confirm the stroke, determine causes, and determine stroke severity. Other tests may be done to find the cause of the stroke. These tests may include: °· Electrocardiography. °· Continuous heart monitoring. °· Echocardiography. °· Carotid ultrasonography. °· Magnetic resonance imaging (MRI). °· A scan of the brain circulation. °· Blood tests. °PREVENTION  °The risk of a stroke can be decreased by appropriately treating high blood pressure, high cholesterol, diabetes, heart disease, and obesity and by quitting smoking, limiting alcohol, and staying physically active. °TREATMENT  °Time is of the essence. It is important to seek treatment at the first sign of these symptoms because you may receive a medicine to dissolve the clot (thrombolytic) that cannot be given if too much time has passed since your symptoms began. Even if you do not know when your symptoms began, get treatment as soon as possible as there are other treatment options available including oxygen, intravenous (IV) fluids, and medicines to thin the blood (anticoagulants). Treatment of stroke depends on the duration, severity, and cause of your symptoms. Medicines and dietary changes may be used to address diabetes, high blood   pressure, and other risk factors. Physical, speech, and occupational therapists will assess you and work with you to improve any functions impaired by the stroke. Measures will be taken to prevent short-term and long-term  complications, including infection from breathing foreign material into the lungs (aspiration pneumonia), blood clots in the legs, bedsores, and falls. Rarely, surgery may be needed to remove large blood clots or to open up blocked arteries. °HOME CARE INSTRUCTIONS  °· Take medicines only as directed by your health care provider. Follow the directions carefully. Medicines may be used to control risk factors for a stroke. Be sure you understand all your medicine instructions. °· You may be told to take a medicine to thin the blood, such as aspirin or the anticoagulant warfarin. Warfarin needs to be taken exactly as instructed. °¨ Too much and too little warfarin are both dangerous. Too much warfarin increases the risk of bleeding. Too little warfarin continues to allow the risk for blood clots. While taking warfarin, you will need to have regular blood tests to measure your blood clotting time. These blood tests usually include both the PT and INR tests. The PT and INR results allow your health care provider to adjust your dose of warfarin. The dose can change for many reasons. It is critically important that you take warfarin exactly as prescribed, and that you have your PT and INR levels drawn exactly as directed. °¨ Many foods, especially foods high in vitamin K, can interfere with warfarin and affect the PT and INR results. Foods high in vitamin K include spinach, kale, broccoli, cabbage, collard and turnip greens, brussels sprouts, peas, cauliflower, seaweed, and parsley, as well as beef and pork liver, green tea, and soybean oil. You should eat a consistent amount of foods high in vitamin K. Avoid major changes in your diet, or notify your health care provider before changing your diet. Arrange a visit with a dietitian to answer your questions. °¨ Many medicines can interfere with warfarin and affect the PT and INR results. You must tell your health care provider about any and all medicines you take. This  includes all vitamins and supplements. Be especially cautious with aspirin and anti-inflammatory medicines. Do not take or discontinue any prescribed or over-the-counter medicine except on the advice of your health care provider or pharmacist. °¨ Warfarin can have side effects, such as excessive bruising or bleeding. You will need to hold pressure over cuts for longer than usual. Your health care provider or pharmacist will discuss other potential side effects. °¨ Avoid sports or activities that may cause injury or bleeding. °¨ Be mindful when shaving, flossing your teeth, or handling sharp objects. °¨ Alcohol can change the body's ability to handle warfarin. It is best to avoid alcoholic drinks or consume only very small amounts while taking warfarin. Notify your health care provider if you change your alcohol intake. °¨ Notify your dentist or other health care providers before procedures. °· If swallow studies have determined that your swallowing reflex is present, you should eat healthy foods. Including 5 or more servings of fruits and vegetables a day may reduce the risk of stroke. Foods may need to be a certain consistency (soft or pureed), or small bites may need to be taken in order to avoid aspirating or choking. Certain dietary changes may be advised to address high blood pressure, high cholesterol, diabetes, or obesity. °¨ Food choices that are low in sodium, saturated fat, trans fat, and cholesterol are recommended to manage high blood pressure. °¨   Food choies that are high in fiber, and low in saturated fat, trans fat, and cholesterol may control cholesterol levels. °¨ Controlling carbohydrates and sugar intake is recommended to manage diabetes. °¨ Reducing calorie intake and making food choices that are low in sodium, saturated fat, trans fat, and cholesterol are recommended to manage obesity. °· Maintain a healthy weight. °· Stay physically active. It is recommended that you get at least 30 minutes of  activity on all or most days. °· Do not use any tobacco products including cigarettes, chewing tobacco, or electronic cigarettes. °· Limit alcohol use even if you are not taking warfarin. Moderate alcohol use is considered to be: °¨ No more than 2 drinks each day for men. °¨ No more than 1 drink each day for nonpregnant women. °· Home safety. A safe home environment is important to reduce the risk of falls. Your health care provider may arrange for specialists to evaluate your home. Having grab bars in the bedroom and bathroom is often important. Your health care provider may arrange for equipment to be used at home, such as raised toilets and a seat for the shower. °· Physical, occupational, and speech therapy. Ongoing therapy may be needed to maximize your recovery after a stroke. If you have been advised to use a walker or a cane, use it at all times. Be sure to keep your therapy appointments. °· Follow all instructions for follow-up with your health care provider. This is very important. This includes any referrals, physical therapy, rehabilitation, and lab tests. Proper follow-up can prevent another stroke from occurring. °SEEK MEDICAL CARE IF: °· You have personality changes. °· You have difficulty swallowing. °· You are seeing double. °· You have dizziness. °· You have a fever. °· You have skin breakdown. °SEEK IMMEDIATE MEDICAL CARE IF:  °Any of these symptoms may represent a serious problem that is an emergency. Do not wait to see if the symptoms will go away. Get medical help right away. Call your local emergency services (911 in [place] Do not drive yourself to the hospital. °· You have sudden weakness or numbness of the face, arm, or leg, especially on one side of the body. °· You have sudden trouble walking or difficulty moving arms or legs. °· You have sudden confusion. °· You have trouble speaking (aphasia) or understanding. °· You have sudden trouble seeing in one or both eyes. °· You have a loss of  balance or coordination. °· You have a sudden, severe headache with no known cause. °· You have new chest pain or an irregular heartbeat. °· You have a partial or total loss of consciousness. °Document Released: 12/03/2005 Document Revised: 04/19/2014 Document Reviewed: 07/13/2012 °[employer]® Patient Information ©2015 [employer]. This information is not intended to replace advice given to you by your health care provider. Make sure you discuss any questions you have with your health care provider. ° °
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
## 2014-12-08 NOTE — Clinical Social Work Note (Signed)
[facility] day note[facility]. Patient and pt's significant other agreeable [facility].   Clinical Social Worker facilitated patient discharge including contacting patient family and facility to confirm patient discharge plans.  Clinical information faxed to facility and family agreeable with plan.  CSW arranged ambulance transport via [facility].  RN to call report prior to discharge.  Clinical Social Worker will sign off for now as social work intervention is no longer needed. Please consult us again if new need arises.  Derenda FennelBashira Mirjana Tarleton, MSW, LCSWA [phone] 12/08/2014 2:32 PM

## 2014-12-08 NOTE — Progress Notes (Signed)
Discharge orders received. Pt for discharge to skilled nursing facility today. IV d/c'd. Pt given discharge instructions and prescriptions with verbalized understanding. Significant other in room to assist with discharge. [facility]. Will call report to facility.

## 2014-12-08 NOTE — Clinical Social Work Note (Signed)
Clinical Social Worker awaiting [facility] number. [facility] is requiring 30-day note, however [facility] is closed until Monday. CSW notified MD and RNCM. [facility], CSW awaiting returned phone call.   CSW to follow for disposition.   Derenda FennelBashira Glenn Gullickson, MSW, LCSWA [phone] 12/08/2014 1:25 PM

## 2014-12-15 ENCOUNTER — Telehealth: Payer: Self-pay

## 2014-12-15 NOTE — Telephone Encounter (Signed)
Make follow up appointment for hospitalization in 1-2 weeks       ----- Message -----    From: Osvaldo ShipperGokul Krishnan, MD    Sent: 12/08/2014 12:40 PM     To: Oneal GroutMahima Pandey, MD    [place], not sure when he will be released. Patient has pending appointment for physical on 01/14/2014

## 2014-12-15 NOTE — Telephone Encounter (Signed)
Noted  

## 2014-12-17 DIAGNOSIS — F329 Major depressive disorder, single episode, unspecified: Secondary | ICD-10-CM | POA: Diagnosis not present

## 2014-12-17 DIAGNOSIS — K219 Gastro-esophageal reflux disease without esophagitis: Secondary | ICD-10-CM | POA: Diagnosis not present

## 2014-12-17 DIAGNOSIS — I10 Essential (primary) hypertension: Secondary | ICD-10-CM | POA: Diagnosis not present

## 2014-12-17 DIAGNOSIS — M545 Low back pain: Secondary | ICD-10-CM | POA: Diagnosis not present

## 2014-12-17 DIAGNOSIS — I639 Cerebral infarction, unspecified: Secondary | ICD-10-CM | POA: Diagnosis not present

## 2014-12-17 DIAGNOSIS — M1990 Unspecified osteoarthritis, unspecified site: Secondary | ICD-10-CM | POA: Diagnosis not present

## 2014-12-17 DIAGNOSIS — F319 Bipolar disorder, unspecified: Secondary | ICD-10-CM | POA: Diagnosis not present

## 2014-12-20 ENCOUNTER — Other Ambulatory Visit: Payer: Self-pay | Admitting: *Deleted

## 2014-12-20 DIAGNOSIS — E785 Hyperlipidemia, unspecified: Secondary | ICD-10-CM

## 2014-12-20 MED ORDER — ATORVASTATIN CALCIUM 20 MG PO TABS: 20.0000 mg | ORAL_TABLET | Freq: Every day | ORAL | Status: DC

## 2014-12-20 MED ORDER — HYDROCHLOROTHIAZIDE 50 MG PO TABS: 50.0000 mg | ORAL_TABLET | Freq: Every day | ORAL | Status: DC

## 2014-12-20 NOTE — Telephone Encounter (Signed)
Patient requested to be faxed to pharmacy 

## 2014-12-27 DIAGNOSIS — I69392 Facial weakness following cerebral infarction: Secondary | ICD-10-CM | POA: Diagnosis not present

## 2014-12-27 DIAGNOSIS — Z9181 History of falling: Secondary | ICD-10-CM | POA: Diagnosis not present

## 2014-12-27 DIAGNOSIS — K5900 Constipation, unspecified: Secondary | ICD-10-CM | POA: Diagnosis not present

## 2014-12-27 DIAGNOSIS — I69322 Dysarthria following cerebral infarction: Secondary | ICD-10-CM | POA: Diagnosis not present

## 2014-12-27 DIAGNOSIS — Z7982 Long term (current) use of aspirin: Secondary | ICD-10-CM | POA: Diagnosis not present

## 2014-12-27 DIAGNOSIS — I69351 Hemiplegia and hemiparesis following cerebral infarction affecting right dominant side: Secondary | ICD-10-CM | POA: Diagnosis not present

## 2014-12-27 DIAGNOSIS — E785 Hyperlipidemia, unspecified: Secondary | ICD-10-CM | POA: Diagnosis not present

## 2014-12-27 DIAGNOSIS — M5187 Other intervertebral disc disorders, lumbosacral region: Secondary | ICD-10-CM | POA: Diagnosis not present

## 2014-12-27 DIAGNOSIS — I10 Essential (primary) hypertension: Secondary | ICD-10-CM | POA: Diagnosis not present

## 2014-12-27 DIAGNOSIS — M1990 Unspecified osteoarthritis, unspecified site: Secondary | ICD-10-CM | POA: Diagnosis not present

## 2014-12-27 DIAGNOSIS — F319 Bipolar disorder, unspecified: Secondary | ICD-10-CM | POA: Diagnosis not present

## 2014-12-29 DIAGNOSIS — K5900 Constipation, unspecified: Secondary | ICD-10-CM | POA: Diagnosis not present

## 2014-12-29 DIAGNOSIS — I69392 Facial weakness following cerebral infarction: Secondary | ICD-10-CM | POA: Diagnosis not present

## 2014-12-29 DIAGNOSIS — I69322 Dysarthria following cerebral infarction: Secondary | ICD-10-CM | POA: Diagnosis not present

## 2014-12-29 DIAGNOSIS — M1990 Unspecified osteoarthritis, unspecified site: Secondary | ICD-10-CM | POA: Diagnosis not present

## 2014-12-29 DIAGNOSIS — F319 Bipolar disorder, unspecified: Secondary | ICD-10-CM | POA: Diagnosis not present

## 2014-12-29 DIAGNOSIS — I69351 Hemiplegia and hemiparesis following cerebral infarction affecting right dominant side: Secondary | ICD-10-CM | POA: Diagnosis not present

## 2014-12-29 DIAGNOSIS — Z7982 Long term (current) use of aspirin: Secondary | ICD-10-CM | POA: Diagnosis not present

## 2014-12-29 DIAGNOSIS — E785 Hyperlipidemia, unspecified: Secondary | ICD-10-CM | POA: Diagnosis not present

## 2014-12-29 DIAGNOSIS — M5187 Other intervertebral disc disorders, lumbosacral region: Secondary | ICD-10-CM | POA: Diagnosis not present

## 2014-12-29 DIAGNOSIS — Z9181 History of falling: Secondary | ICD-10-CM | POA: Diagnosis not present

## 2014-12-29 DIAGNOSIS — I10 Essential (primary) hypertension: Secondary | ICD-10-CM | POA: Diagnosis not present

## 2014-12-30 DIAGNOSIS — I69351 Hemiplegia and hemiparesis following cerebral infarction affecting right dominant side: Secondary | ICD-10-CM | POA: Diagnosis not present

## 2014-12-30 DIAGNOSIS — M5187 Other intervertebral disc disorders, lumbosacral region: Secondary | ICD-10-CM | POA: Diagnosis not present

## 2014-12-30 DIAGNOSIS — Z7982 Long term (current) use of aspirin: Secondary | ICD-10-CM | POA: Diagnosis not present

## 2014-12-30 DIAGNOSIS — I69322 Dysarthria following cerebral infarction: Secondary | ICD-10-CM | POA: Diagnosis not present

## 2014-12-30 DIAGNOSIS — M1990 Unspecified osteoarthritis, unspecified site: Secondary | ICD-10-CM | POA: Diagnosis not present

## 2014-12-30 DIAGNOSIS — Z9181 History of falling: Secondary | ICD-10-CM | POA: Diagnosis not present

## 2014-12-30 DIAGNOSIS — E785 Hyperlipidemia, unspecified: Secondary | ICD-10-CM | POA: Diagnosis not present

## 2014-12-30 DIAGNOSIS — K5900 Constipation, unspecified: Secondary | ICD-10-CM | POA: Diagnosis not present

## 2014-12-30 DIAGNOSIS — I10 Essential (primary) hypertension: Secondary | ICD-10-CM | POA: Diagnosis not present

## 2014-12-30 DIAGNOSIS — I69392 Facial weakness following cerebral infarction: Secondary | ICD-10-CM | POA: Diagnosis not present

## 2014-12-30 DIAGNOSIS — F319 Bipolar disorder, unspecified: Secondary | ICD-10-CM | POA: Diagnosis not present

## 2014-12-30 DIAGNOSIS — T83498A Other mechanical complication of other prosthetic devices, implants and grafts of genital tract, initial encounter: Secondary | ICD-10-CM | POA: Diagnosis not present

## 2015-01-03 ENCOUNTER — Other Ambulatory Visit: Payer: Self-pay | Admitting: *Deleted

## 2015-01-03 ENCOUNTER — Other Ambulatory Visit: Payer: Medicare Other

## 2015-01-03 DIAGNOSIS — I10 Essential (primary) hypertension: Secondary | ICD-10-CM | POA: Diagnosis not present

## 2015-01-03 MED ORDER — OXYCODONE HCL 15 MG PO TABS: 15.0000 mg | ORAL_TABLET | Freq: Four times a day (QID) | ORAL | Status: DC | PRN

## 2015-01-03 NOTE — Telephone Encounter (Signed)
Patient walked in and requested. 

## 2015-01-04 ENCOUNTER — Telehealth: Payer: Self-pay | Admitting: *Deleted

## 2015-01-04 DIAGNOSIS — E785 Hyperlipidemia, unspecified: Secondary | ICD-10-CM | POA: Diagnosis not present

## 2015-01-04 DIAGNOSIS — I69322 Dysarthria following cerebral infarction: Secondary | ICD-10-CM | POA: Diagnosis not present

## 2015-01-04 DIAGNOSIS — Z9181 History of falling: Secondary | ICD-10-CM | POA: Diagnosis not present

## 2015-01-04 DIAGNOSIS — K5900 Constipation, unspecified: Secondary | ICD-10-CM | POA: Diagnosis not present

## 2015-01-04 DIAGNOSIS — I69392 Facial weakness following cerebral infarction: Secondary | ICD-10-CM | POA: Diagnosis not present

## 2015-01-04 DIAGNOSIS — M5187 Other intervertebral disc disorders, lumbosacral region: Secondary | ICD-10-CM | POA: Diagnosis not present

## 2015-01-04 DIAGNOSIS — I10 Essential (primary) hypertension: Secondary | ICD-10-CM | POA: Diagnosis not present

## 2015-01-04 DIAGNOSIS — M1990 Unspecified osteoarthritis, unspecified site: Secondary | ICD-10-CM | POA: Diagnosis not present

## 2015-01-04 DIAGNOSIS — F319 Bipolar disorder, unspecified: Secondary | ICD-10-CM | POA: Diagnosis not present

## 2015-01-04 DIAGNOSIS — Z7982 Long term (current) use of aspirin: Secondary | ICD-10-CM | POA: Diagnosis not present

## 2015-01-04 DIAGNOSIS — I69351 Hemiplegia and hemiparesis following cerebral infarction affecting right dominant side: Secondary | ICD-10-CM | POA: Diagnosis not present

## 2015-01-04 LAB — CBC WITH DIFFERENTIAL/PLATELET
BASOS: 1 %
Basophils Absolute: 0.1 10*3/uL (ref 0.0–0.2)
EOS ABS: 0.1 10*3/uL (ref 0.0–0.4)
EOS: 1 %
HCT: 42.6 % (ref 37.5–51.0)
Hemoglobin: 15 g/dL (ref 12.6–17.7)
IMMATURE GRANS (ABS): 0 10*3/uL (ref 0.0–0.1)
Immature Granulocytes: 0 %
Lymphocytes Absolute: 9.8 10*3/uL — ABNORMAL HIGH (ref 0.7–3.1)
Lymphs: 76 %
MCH: 30.6 pg (ref 26.6–33.0)
MCHC: 35.2 g/dL (ref 31.5–35.7)
MCV: 87 fL (ref 79–97)
Monocytes Absolute: 0.8 10*3/uL (ref 0.1–0.9)
Monocytes: 7 %
NEUTROS ABS: 1.9 10*3/uL (ref 1.4–7.0)
NEUTROS PCT: 15 %
RBC: 4.9 x10E6/uL (ref 4.14–5.80)
RDW: 13.5 % (ref 12.3–15.4)
WBC: 12.7 10*3/uL — ABNORMAL HIGH (ref 3.4–10.8)

## 2015-01-04 LAB — COMPREHENSIVE METABOLIC PANEL
ALK PHOS: 84 IU/L (ref 39–117)
ALT: 32 IU/L (ref 0–44)
AST: 30 IU/L (ref 0–40)
Albumin/Globulin Ratio: 1.1 (ref 1.1–2.5)
Albumin: 4.2 g/dL (ref 3.5–5.5)
BUN / CREAT RATIO: 19 (ref 9–20)
BUN: 18 mg/dL (ref 6–24)
CHLORIDE: 98 mmol/L (ref 97–108)
CO2: 23 mmol/L (ref 18–29)
Calcium: 9.7 mg/dL (ref 8.7–10.2)
Creatinine, Ser: 0.96 mg/dL (ref 0.76–1.27)
GFR calc Af Amer: 100 mL/min/{1.73_m2} (ref >59)
GFR, EST NON AFRICAN AMERICAN: 87 mL/min/{1.73_m2} (ref >59)
GLUCOSE: 102 mg/dL — AB (ref 65–99)
Globulin, Total: 3.8 g/dL (ref 1.5–4.5)
POTASSIUM: 3.9 mmol/L (ref 3.5–5.2)
SODIUM: 137 mmol/L (ref 134–144)
TOTAL PROTEIN: 8 g/dL (ref 6.0–8.5)
Total Bilirubin: 0.5 mg/dL (ref 0.0–1.2)

## 2015-01-04 LAB — LIPID PANEL
CHOL/HDL RATIO: 2.8 ratio (ref 0.0–5.0)
Cholesterol, Total: 122 mg/dL (ref 100–199)
HDL: 43 mg/dL (ref >39)
LDL Calculated: 51 mg/dL (ref 0–99)
TRIGLYCERIDES: 141 mg/dL (ref 0–149)
VLDL Cholesterol Cal: 28 mg/dL (ref 5–40)

## 2015-01-04 NOTE — Telephone Encounter (Signed)
Clydie BraunKaren with [facility] called and wanted verbal orders for OT. Called and given.

## 2015-01-05 DIAGNOSIS — Z9181 History of falling: Secondary | ICD-10-CM | POA: Diagnosis not present

## 2015-01-05 DIAGNOSIS — F319 Bipolar disorder, unspecified: Secondary | ICD-10-CM | POA: Diagnosis not present

## 2015-01-05 DIAGNOSIS — M1990 Unspecified osteoarthritis, unspecified site: Secondary | ICD-10-CM | POA: Diagnosis not present

## 2015-01-05 DIAGNOSIS — I69392 Facial weakness following cerebral infarction: Secondary | ICD-10-CM | POA: Diagnosis not present

## 2015-01-05 DIAGNOSIS — E785 Hyperlipidemia, unspecified: Secondary | ICD-10-CM | POA: Diagnosis not present

## 2015-01-05 DIAGNOSIS — M5187 Other intervertebral disc disorders, lumbosacral region: Secondary | ICD-10-CM | POA: Diagnosis not present

## 2015-01-05 DIAGNOSIS — Z7982 Long term (current) use of aspirin: Secondary | ICD-10-CM | POA: Diagnosis not present

## 2015-01-05 DIAGNOSIS — K5900 Constipation, unspecified: Secondary | ICD-10-CM | POA: Diagnosis not present

## 2015-01-05 DIAGNOSIS — I10 Essential (primary) hypertension: Secondary | ICD-10-CM | POA: Diagnosis not present

## 2015-01-05 DIAGNOSIS — I69322 Dysarthria following cerebral infarction: Secondary | ICD-10-CM | POA: Diagnosis not present

## 2015-01-05 DIAGNOSIS — I69351 Hemiplegia and hemiparesis following cerebral infarction affecting right dominant side: Secondary | ICD-10-CM | POA: Diagnosis not present

## 2015-01-10 DIAGNOSIS — I69392 Facial weakness following cerebral infarction: Secondary | ICD-10-CM | POA: Diagnosis not present

## 2015-01-10 DIAGNOSIS — I69322 Dysarthria following cerebral infarction: Secondary | ICD-10-CM | POA: Diagnosis not present

## 2015-01-10 DIAGNOSIS — F319 Bipolar disorder, unspecified: Secondary | ICD-10-CM | POA: Diagnosis not present

## 2015-01-10 DIAGNOSIS — E785 Hyperlipidemia, unspecified: Secondary | ICD-10-CM | POA: Diagnosis not present

## 2015-01-10 DIAGNOSIS — K5900 Constipation, unspecified: Secondary | ICD-10-CM | POA: Diagnosis not present

## 2015-01-10 DIAGNOSIS — Z7982 Long term (current) use of aspirin: Secondary | ICD-10-CM | POA: Diagnosis not present

## 2015-01-10 DIAGNOSIS — I10 Essential (primary) hypertension: Secondary | ICD-10-CM | POA: Diagnosis not present

## 2015-01-10 DIAGNOSIS — I69351 Hemiplegia and hemiparesis following cerebral infarction affecting right dominant side: Secondary | ICD-10-CM | POA: Diagnosis not present

## 2015-01-10 DIAGNOSIS — M1990 Unspecified osteoarthritis, unspecified site: Secondary | ICD-10-CM | POA: Diagnosis not present

## 2015-01-10 DIAGNOSIS — Z9181 History of falling: Secondary | ICD-10-CM | POA: Diagnosis not present

## 2015-01-10 DIAGNOSIS — M5187 Other intervertebral disc disorders, lumbosacral region: Secondary | ICD-10-CM | POA: Diagnosis not present

## 2015-01-11 ENCOUNTER — Ambulatory Visit (INDEPENDENT_AMBULATORY_CARE_PROVIDER_SITE_OTHER): Payer: Medicare Other | Admitting: Internal Medicine

## 2015-01-11 ENCOUNTER — Encounter: Payer: Self-pay | Admitting: Internal Medicine

## 2015-01-11 VITALS — BP 126/78 | HR 93 | Temp 96.9°F | Ht 71.0 in | Wt 168.0 lb

## 2015-01-11 DIAGNOSIS — K5900 Constipation, unspecified: Secondary | ICD-10-CM

## 2015-01-11 DIAGNOSIS — Z1211 Encounter for screening for malignant neoplasm of colon: Secondary | ICD-10-CM

## 2015-01-11 DIAGNOSIS — I69351 Hemiplegia and hemiparesis following cerebral infarction affecting right dominant side: Secondary | ICD-10-CM | POA: Diagnosis not present

## 2015-01-11 DIAGNOSIS — G4700 Insomnia, unspecified: Secondary | ICD-10-CM

## 2015-01-11 DIAGNOSIS — I10 Essential (primary) hypertension: Secondary | ICD-10-CM | POA: Diagnosis not present

## 2015-01-11 DIAGNOSIS — Z9181 History of falling: Secondary | ICD-10-CM | POA: Diagnosis not present

## 2015-01-11 DIAGNOSIS — D72829 Elevated white blood cell count, unspecified: Secondary | ICD-10-CM

## 2015-01-11 DIAGNOSIS — N9989 Other postprocedural complications and disorders of genitourinary system: Secondary | ICD-10-CM

## 2015-01-11 DIAGNOSIS — I639 Cerebral infarction, unspecified: Secondary | ICD-10-CM

## 2015-01-11 DIAGNOSIS — Z7982 Long term (current) use of aspirin: Secondary | ICD-10-CM | POA: Diagnosis not present

## 2015-01-11 DIAGNOSIS — M5187 Other intervertebral disc disorders, lumbosacral region: Secondary | ICD-10-CM | POA: Diagnosis not present

## 2015-01-11 DIAGNOSIS — M5442 Lumbago with sciatica, left side: Secondary | ICD-10-CM

## 2015-01-11 DIAGNOSIS — F319 Bipolar disorder, unspecified: Secondary | ICD-10-CM | POA: Diagnosis not present

## 2015-01-11 DIAGNOSIS — Z0000 Encounter for general adult medical examination without abnormal findings: Secondary | ICD-10-CM

## 2015-01-11 DIAGNOSIS — E785 Hyperlipidemia, unspecified: Secondary | ICD-10-CM

## 2015-01-11 DIAGNOSIS — H409 Unspecified glaucoma: Secondary | ICD-10-CM

## 2015-01-11 DIAGNOSIS — M1990 Unspecified osteoarthritis, unspecified site: Secondary | ICD-10-CM | POA: Diagnosis not present

## 2015-01-11 DIAGNOSIS — I69392 Facial weakness following cerebral infarction: Secondary | ICD-10-CM | POA: Diagnosis not present

## 2015-01-11 DIAGNOSIS — I69322 Dysarthria following cerebral infarction: Secondary | ICD-10-CM | POA: Diagnosis not present

## 2015-01-11 DIAGNOSIS — T8389XA Other specified complication of genitourinary prosthetic devices, implants and grafts, initial encounter: Secondary | ICD-10-CM

## 2015-01-11 MED ORDER — ZOLPIDEM TARTRATE 5 MG PO TABS: 5.0000 mg | ORAL_TABLET | Freq: Every evening | ORAL | Status: DC | PRN

## 2015-01-11 NOTE — Progress Notes (Signed)
Patient ID: Nathan Buchanan, male   DOB: [birth year], 59 y.o.   MRN: [id number]    Chief Complaint  Patient presents with  . Annual Exam    Yearly check,up, EKG completed on 12/04/14, and review labs (copy printed)   . Hospitalization Follow-up    hospital and SNF follow up  . Medical Clearance    for surgery   No Known Allergies  HPI 59 y/o male pt is here for annual exam. He is here with his fiance'.  He was in the hospital from 12/04/14-12/08/14 with acute stroke and right sided weakness. He was seen by neurology and was continued on aspirin and statin and sent to SNF [facility] for STR. He was there for a week and then discharged home with therapy services at home. PT comes twice a week and OT comes once a week and a home health nurse comes 2/week. He does not use any assistive device. No falls reported. Compliant with his medications.   blood pressure has been controlled. He is due for eye exam for his glaucoma- due in February He had a bowel movement yesterday, dulcolax has been helpful. Mentions having colonoscopy 5-6 years back, not available for review Remus Loffler helps him sleep He needs a surgical clearance for explantation of his penile prosthesis due to erosion and is currently on antibiotics He has been a smoker in the past.  Immunization History  Administered Date(s) Administered  . Influenza Whole 10/17/2006  . Influenza-Unspecified 08/21/2013, 11/16/2014  . Pneumococcal-Unspecified 11/16/2014  . Rabies, IM 10/22/2012  . Rabies, intradermal 10/22/2012  . Tdap 10/22/2012   Review of Systems  Constitutional: Negative for fever, chills, malaise/fatigue and diaphoresis.  HENT: Negative for congestion, hearing loss and sore throat.   Eyes: Negative for blurred vision, double vision and discharge. has hx of glaucoma. Wears glasses Respiratory: Negative for cough, sputum production, shortness of breath and wheezing.   Cardiovascular: Negative for chest pain,  palpitations, orthopnea and leg swelling.  Gastrointestinal: Negative for heartburn, nausea, vomiting, abdominal pain, diarrhea, melena, rectal bleed, hemorrhoids. Has constipation and dulcolax is helpful  Genitourinary: Negative for flank pain. has some dysuria and has nocturia. No penile discharge. No hematuria. Musculoskeletal: Negative for falls, joint pain and myalgias. chronic back pain and roxicodone is helpful Skin: Negative for itching and rash.  Neurological: Negative for dizziness, tingling, focal weakness and headaches.  Psychiatric/Behavioral: Negative for depression and memory loss. The patient is not nervous/anxious. Has insomnia and taken [place]   Past Medical History  Diagnosis Date  . Hypertension   . Arthritis   . Back ache   . Urinary frequency   . Generalized osteoarthrosis, involving multiple sites   . Dermatophytosis of the body   . Chronic hepatitis C without mention of hepatic coma   . Bipolar I disorder, most recent episode (or current) unspecified   . Unspecified glaucoma   . Cirrhosis of liver without mention of alcohol   . Osteoarthrosis, unspecified whether generalized or localized, unspecified site   . Stroke     mini stroke  . Hyperlipidemia    Past Surgical History  Procedure Laterality Date  . Penile prosthesis implant  04/12/2010    Dr.Grapey, Insertion of 3 peice penile prosthesis   . Anal fissure repair  2008  . Abcess drainage      Gluteal MRSA drainage   . Colonoscopy  2008    Dr.Edwards, Internal Hemorrhoids    Current Outpatient Prescriptions on File Prior to Visit  Medication Sig  Dispense Refill  . acetaminophen (TYLENOL) 325 MG tablet Take 2 tablets (650 mg total) by mouth every 4 (four) hours as needed for mild pain (or temp >/= 99.5 F).    Marland Kitchen. aspirin EC 325 MG tablet Take 1 tablet (325 mg total) by mouth daily. 30 tablet 0  . atorvastatin (LIPITOR) 20 MG tablet Take 1 tablet (20 mg total) by mouth daily at 6 PM. For Cholesterol 30  tablet 1  . bimatoprost (LUMIGAN) 0.03 % ophthalmic drops Place 1 drop into both eyes at bedtime.     . clotrimazole-betamethasone (LOTRISONE) cream Apply 1 application topically 2 (two) times daily.    Tery Sanfilippo. Docusate Sodium (DSS) 100 MG CAPS Take 100 mg by mouth 2 (two) times daily.    . hydrochlorothiazide (HYDRODIURIL) 50 MG tablet Take 1 tablet (50 mg total) by mouth daily with breakfast. 30 tablet 1  . oxyCODONE (ROXICODONE) 15 MG immediate release tablet Take 1 tablet (15 mg total) by mouth every 6 (six) hours as needed for pain. 120 tablet 0  . polyethylene glycol (MIRALAX / GLYCOLAX) packet Take 17 g by mouth daily. 14 each 0   No current facility-administered medications on file prior to visit.    Family History  Problem Relation Age of Onset  . Hypertension Mother   . Stroke Father   . Cancer Father     colon  . Diabetes Mother   . Diabetes Sister   . Diabetes Sister   . Diabetes Brother    History   Social History  . Marital Status: Single    Spouse Name: N/A    Number of Children: N/A  . Years of Education: N/A   Occupational History  . Not on file.   Social History Main Topics  . Smoking status: Former Smoker -- 5 years    Quit date: 12/17/2013  . Smokeless tobacco: Not on file  . Alcohol Use: 0.0 oz/week    0 Not specified per week  . Drug Use: Yes    Special: Marijuana     Comment: 1-2 x per week  . Sexual Activity: Yes   Other Topics Concern  . Not on file   Social History Narrative   Physical exam BP 126/78 mmHg  Pulse 93  Temp(Src) 96.9 F (36.1 C) (Oral)  Ht 5\' 11"  (1.803 m)  Wt 168 lb (76.204 kg)  BMI 23.44 kg/m2  SpO2 98%  Wt Readings from Last 3 Encounters:  01/11/15 168 lb (76.204 kg)  12/04/14 179 lb (81.194 kg)  09/07/14 181 lb 3.2 oz (82.192 kg)   General- adult male in no acute distress Head- atraumatic, normocephalic Eyes- PERRLA, EOMI, no pallor, no icterus, no discharge Ears- left ear normal tympanic membrane and normal  external ear canal , right ear normal tympanic membrane and normal external ear canal Neck- no lymphadenopathy, no thyromegaly, no jugular vein distension, no carotid bruit Nose- normal nasal mucosa, no maxillary sinus tenderness, no frontal sinus tenderness Mouth- normal mucus membrane, no oral thrush, normal oropharynx Chest- no chest wall deformities, no chest wall tenderness Cardiovascular- normal s1,s2, no murmurs, normal distal pulses Respiratory- bilateral clear to auscultation, no wheeze, no rhonchi, no crackles Abdomen- bowel sounds present, soft, non tender, no organomegaly, no abdominal bruits, no guarding or rigidity, no CVA tenderness Genitalia- tenderness on palpation of the penis with penile prosthesis. Normal scrotal exam. no penile swelling, eroded penile area but no drainage noted. Palpable testis Musculoskeletal- able to move all 4 extremities, right sided weakness  with strength of 4/5, limp on right side while walking, no use of assistive device, no leg edema Neurological- no focal deficit, normal reflexes, normal sensation to fine touch and vibration Skin- warm and dry Psychiatry- alert and oriented to person, place and time, normal mood and affect  Labs CBC Latest Ref Rng 01/03/2015 12/04/2014 12/04/2014  WBC 3.4 - 10.8 x10E3/uL 12.7(H) 10.8(H) 9.4  Hemoglobin 12.6 - 17.7 g/dL 16.1 09.6 04.5  Hematocrit 37.5 - 51.0 % 42.6 43.2 43.5  Platelets 150 - 400 K/uL - 214 221   CMP Latest Ref Rng 01/03/2015 12/04/2014 12/04/2014  Glucose 65 - 99 mg/dL 409(W) - 119(J)  BUN 6 - 24 mg/dL 18 - 15  Creatinine 4.78 - 1.27 mg/dL 2.95 6.21 3.08  Sodium 134 - 144 mmol/L 137 - 138  Potassium 3.5 - 5.2 mmol/L 3.9 - 4.0  Chloride 97 - 108 mmol/L 98 - 102  CO2 18 - 29 mmol/L 23 - 24  Calcium 8.7 - 10.2 mg/dL 9.7 - 9.4  Total Protein 6.0 - 8.5 g/dL 8.0 - -  Albumin 3.5 - 5.5 g/dL 4.2 - -  Total Bilirubin 0.0 - 1.2 mg/dL 0.5 - -  Alkaline Phos 39 - 117 IU/L 84 - -  AST 0 - 40 IU/L 30 -  -  ALT 0 - 44 IU/L 32 - -   Lipid Panel     Component Value Date/Time   CHOL 194 12/05/2014 0406   TRIG 141 01/03/2015 0815   HDL 43 01/03/2015 0815   HDL 38* 12/05/2014 0406   CHOLHDL 2.8 01/03/2015 0815   CHOLHDL 5.1 12/05/2014 0406   VLDL 42* 12/05/2014 0406   LDLCALC 51 01/03/2015 0815   LDLCALC 114* 12/05/2014 0406   Lab Results  Component Value Date   HGBA1C 5.5 12/05/2014   Lab Results  Component Value Date   TSH 3.628 07/06/2009   ekg from hospital from 12/15 reviewed  12/11/14 cta head Acute infarct in the left posterior limb internal capsule and centrum semiovale. This has developed since the prior CT of 12/04/2014. 1 cm hypodensity right cerebellum consistent with infarct of indeterminate age, likely chronic Negative CTA head.   Assessment/plan  1. Acute CVA (cerebrovascular accident) Continue aspirin EC 325 mg daily and lipitor 20 mg daily. bp controlled. Continue hctz  2. Constipation, unspecified constipation type Continue dulcolax for now, monitor - TSH  3. Leukocytosis likely from his prosthesis erosion and colonization. Currently on antibiotics- pt does not remember the name of antibiotics - TSH - CBC with Differential  4. Hyperlipidemia Continue lipitor 20 mg daily - CMP; Future - Lipid Panel; Future  5. Colon cancer screening - Fecal occult blood, imunochemical; Future  6. Routine general medical examination at a health care facility the patient was counseled regarding the appropriate use of alcohol, prevention of dental and periodontal disease, diet, regular sustained exercise for at least 30 minutes 5 times per week, the proper use of sunscreen and protective clothing, tobacco use, and recommended schedule for GI hemoccult testing, colonoscopy, cholesterol, thyroid and diabetes screening.  7. Essential hypertension Stable reading, continue hctz 50 mg daily, monitor renal function  8. Glaucoma Continue lumigan eye drop, pending eye  appointment  9. Insomnia Continue ambien 5 mg daily  10. Left-sided low back pain with sciatica, sciatica laterality unspecified Chronic, stable with current regimen of roxicodone, bowel movement stable  11. Erosion of penile prosthesis Will need surgical exploration and explantation. Reviewed urology note. Called and spoke with Dr Isabel Caprice  from urology. reviewed that with his recent cva, he is at high risk for another stroke if we hold his aspirin. I recommend proceeding with surgery with continuation of aspirin and monitoring of h&h. Dr Isabel Caprice agrees with this care plan. CMA called neurology office to see if neurology appointment can be made earlier to further assess for surgical clearance given his recent cva

## 2015-01-12 LAB — CBC WITH DIFFERENTIAL/PLATELET
BASOS: 1 %
Basophils Absolute: 0.1 10*3/uL (ref 0.0–0.2)
EOS: 1 %
Eosinophils Absolute: 0.1 10*3/uL (ref 0.0–0.4)
HCT: 46.4 % (ref 37.5–51.0)
HEMOGLOBIN: 15.9 g/dL (ref 12.6–17.7)
IMMATURE GRANULOCYTES: 0 %
Immature Grans (Abs): 0 10*3/uL (ref 0.0–0.1)
Lymphocytes Absolute: 7.1 10*3/uL — ABNORMAL HIGH (ref 0.7–3.1)
Lymphs: 70 %
MCH: 30.6 pg (ref 26.6–33.0)
MCHC: 34.3 g/dL (ref 31.5–35.7)
MCV: 89 fL (ref 79–97)
Monocytes Absolute: 1 10*3/uL — ABNORMAL HIGH (ref 0.1–0.9)
Monocytes: 9 %
Neutrophils Absolute: 1.9 10*3/uL (ref 1.4–7.0)
Neutrophils Relative %: 19 %
Platelets: 220 10*3/uL (ref 150–379)
RBC: 5.19 x10E6/uL (ref 4.14–5.80)
RDW: 13.4 % (ref 12.3–15.4)
WBC: 10.1 10*3/uL (ref 3.4–10.8)

## 2015-01-12 LAB — TSH: TSH: 4.9 u[IU]/mL — ABNORMAL HIGH (ref 0.450–4.500)

## 2015-01-13 ENCOUNTER — Telehealth: Payer: Self-pay | Admitting: Neurology

## 2015-01-13 DIAGNOSIS — M1990 Unspecified osteoarthritis, unspecified site: Secondary | ICD-10-CM | POA: Diagnosis not present

## 2015-01-13 DIAGNOSIS — I69322 Dysarthria following cerebral infarction: Secondary | ICD-10-CM | POA: Diagnosis not present

## 2015-01-13 DIAGNOSIS — M5187 Other intervertebral disc disorders, lumbosacral region: Secondary | ICD-10-CM | POA: Diagnosis not present

## 2015-01-13 DIAGNOSIS — Z7982 Long term (current) use of aspirin: Secondary | ICD-10-CM | POA: Diagnosis not present

## 2015-01-13 DIAGNOSIS — I69351 Hemiplegia and hemiparesis following cerebral infarction affecting right dominant side: Secondary | ICD-10-CM | POA: Diagnosis not present

## 2015-01-13 DIAGNOSIS — E785 Hyperlipidemia, unspecified: Secondary | ICD-10-CM | POA: Diagnosis not present

## 2015-01-13 DIAGNOSIS — I10 Essential (primary) hypertension: Secondary | ICD-10-CM | POA: Diagnosis not present

## 2015-01-13 DIAGNOSIS — K5900 Constipation, unspecified: Secondary | ICD-10-CM | POA: Diagnosis not present

## 2015-01-13 DIAGNOSIS — I69392 Facial weakness following cerebral infarction: Secondary | ICD-10-CM | POA: Diagnosis not present

## 2015-01-13 DIAGNOSIS — F319 Bipolar disorder, unspecified: Secondary | ICD-10-CM | POA: Diagnosis not present

## 2015-01-13 DIAGNOSIS — Z9181 History of falling: Secondary | ICD-10-CM | POA: Diagnosis not present

## 2015-01-13 NOTE — Telephone Encounter (Signed)
Patient spouse, Darel HongJudy called and stated patient needing surgery clearance by Dr. Roda ShuttersXu.  Original appointment on 2/26 was ccancelledd by patient per cancellation note.  Next available appointment isn't until 03/09/15.  Spouse requesting an earlier appt.  Please call and advise.

## 2015-01-13 NOTE — Telephone Encounter (Signed)
Called patient back and he states that he needs to have penile surgery, states that the date has not been set yet due to clearance needed. Also states that he did not cancel his appointment on 2/26, per EMR "patient cancelled will cb later to r/s appointment", Dr Roda ShuttersXu please advise.

## 2015-01-14 ENCOUNTER — Telehealth: Payer: Self-pay | Admitting: *Deleted

## 2015-01-14 DIAGNOSIS — E039 Hypothyroidism, unspecified: Secondary | ICD-10-CM

## 2015-01-14 NOTE — Telephone Encounter (Signed)
I saw 02/23/15 morning openings if he is OK with that. Otherwise, he will be put on waiting list and to see if there is any cancellations. Thanks.  Marvel PlanJindong Kelty Szafran, MD PhD Stroke Neurology 01/14/2015 1:10 PM

## 2015-01-14 NOTE — Telephone Encounter (Signed)
Spoke with the patient regarding the labs, he states that he understands the results and will come have his labs drawn in 4 weeks.

## 2015-01-14 NOTE — Telephone Encounter (Signed)
-----   Message from Oneal GroutMahima Pandey, MD sent at 01/14/2015  3:03 PM EST ----- Your white count has improved suggesting controlled infection. Your thyroid function appears to be slightly impaired. Will have a recheck on tsh with total and free t4 in 4 weeks

## 2015-01-14 NOTE — Telephone Encounter (Signed)
Dr Roda ShuttersXu is he cleared for surgery?

## 2015-01-14 NOTE — Telephone Encounter (Signed)
I saw him at the end of Dec in hospital and he had right side hemiparesis at that time and on discharge. I have not seen him since so I am not comfortable to clear him for surgery. However, he was seen days earlier (01/11/15) with his PCP and, in PCP's note, he has no focal deficit. Therefore, If his PCP is able to clear him for surgery, I am OK with it.   Marvel PlanJindong Aaleigha Bozza, MD PhD Stroke Neurology 01/14/2015 5:16 PM

## 2015-01-17 NOTE — Telephone Encounter (Signed)
Patient will also continue the Aspirin, so there will not be any changes.

## 2015-01-17 NOTE — Telephone Encounter (Signed)
Called patient and informed him that he was cleared for surgery and patient requested that information be forwarded to Dr Lowella [facility]Grapy[facility] at [facility] left message with surgery voice mail that patient has been cleared for surgery and to call the office back.

## 2015-01-17 NOTE — Telephone Encounter (Signed)
Received a call back from [facility] requesting note sent over to [phone], notes have been faxed to [facility].

## 2015-01-18 ENCOUNTER — Other Ambulatory Visit: Payer: Self-pay | Admitting: Urology

## 2015-01-18 NOTE — Progress Notes (Signed)
Called and requested orders from Dr Vernie Ammonsttelin surgery 01-24-15 pre op 01-19-15 Thanks

## 2015-01-18 NOTE — Progress Notes (Addendum)
Called Dr. Ellin GoodieGrapey's office request release of orders to Epic sign and held surgery 01-24-15 pre op 01-19-15 Thanks

## 2015-01-19 ENCOUNTER — Encounter (HOSPITAL_COMMUNITY): Payer: Self-pay

## 2015-01-19 ENCOUNTER — Encounter (HOSPITAL_COMMUNITY)
Admission: RE | Admit: 2015-01-19 | Discharge: 2015-01-19 | Disposition: A | Discharge status: Home / Self Care | Payer: Medicare Other | Source: Ambulatory Visit | Attending: Urology | Admitting: Urology

## 2015-01-19 DIAGNOSIS — N9989 Other postprocedural complications and disorders of genitourinary system: Secondary | ICD-10-CM | POA: Insufficient documentation

## 2015-01-19 DIAGNOSIS — Z01818 Encounter for other preprocedural examination: Secondary | ICD-10-CM | POA: Diagnosis not present

## 2015-01-19 LAB — BASIC METABOLIC PANEL
ANION GAP: 9 (ref 5–15)
BUN: 18 mg/dL (ref 6–23)
CALCIUM: 9.3 mg/dL (ref 8.4–10.5)
CHLORIDE: 102 mmol/L (ref 96–112)
CO2: 26 mmol/L (ref 19–32)
Creatinine, Ser: 0.98 mg/dL (ref 0.50–1.35)
GFR calc non Af Amer: 89 mL/min — ABNORMAL LOW (ref >90)
Glucose, Bld: 104 mg/dL — ABNORMAL HIGH (ref 70–99)
POTASSIUM: 3.6 mmol/L (ref 3.5–5.1)
SODIUM: 137 mmol/L (ref 135–145)

## 2015-01-19 LAB — CBC
HCT: 41.7 % (ref 39.0–52.0)
HEMOGLOBIN: 14.3 g/dL (ref 13.0–17.0)
MCH: 30.2 pg (ref 26.0–34.0)
MCHC: 34.3 g/dL (ref 30.0–36.0)
MCV: 88.2 fL (ref 78.0–100.0)
Platelets: 207 10*3/uL (ref 150–400)
RBC: 4.73 MIL/uL (ref 4.22–5.81)
RDW: 12.9 % (ref 11.5–15.5)
WBC: 13.5 10*3/uL — AB (ref 4.0–10.5)

## 2015-01-19 LAB — SURGICAL PCR SCREEN
MRSA, PCR: NEGATIVE
STAPHYLOCOCCUS AUREUS: NEGATIVE

## 2015-01-19 NOTE — Pre-Procedure Instructions (Addendum)
01-19-15 EKG 12'15, Echo 12'15, CXR 12'15 Epic. 01-19-15 Clearance note -Dr. Glade LloydPandey with chart.

## 2015-01-19 NOTE — Progress Notes (Signed)
CBC results done 01/19/15 faxed via EPIC to Dr Isabel CapriceGrapey.

## 2015-01-19 NOTE — Patient Instructions (Addendum)
20 Nathan Buchanan  01/19/2015   Your procedure is scheduled on:   01-24-2015 Monday  [facility] and [facility]. Arrive at  0530      AM .  Call this number if you have problems the morning of surgery: [phone]  Or Presurgical Testing [phone].   For Living Will and/or Health Care Power Attorney Forms: please provide copy for your medical record,may bring AM of surgery(Forms should be already notarized -we do not provide this service).(01-19-15  information preferred today and given packet).    Do not eat food/ or drink: After Midnight.      Take these medicines the morning of surgery with A SIP OF WATER: none. Aspirin per MD instructions.   Do not wear jewelry, make-up or nail polish.  Do not wear deodorant, lotions, powders, or perfumes.   Do not shave legs and under arms- 48 hours(2 days) prior to first CHG shower.(Shaving face and neck okay.)  Do not bring valuables to the hospital.(Hospital is not responsible for lost valuables).  Contacts, dentures or removable bridgework, body piercing, hair pins may not be worn into surgery.  Leave suitcase in the car. After surgery it may be brought to your room.  For patients admitted to the hospital, checkout time is 11:00 AM the day of discharge.(Restricted visitors-Any Persons displaying flu-like symptoms or illness).    Patients discharged the day of surgery will not be allowed to drive home. Must have responsible person with you x 24 hours once discharged.  Name and phone number of your driver: Marita SnellenJudy Vance-fiancee- [phone] cell     Please read over the following fact sheets that you were given:  CHG(Chlorhexidine Gluconate 4% Surgical Soap) use.          [facility] - Preparing for Surgery Before surgery, you can play an important role.  Because skin is not sterile, your skin needs to be as free of germs as possible.  You can reduce the number of germs on your skin by washing  with CHG (chlorahexidine gluconate) soap before surgery.  CHG is an antiseptic cleaner which kills germs and bonds with the skin to continue killing germs even after washing. Please DO NOT use if you have an allergy to CHG or antibacterial soaps.  If your skin becomes reddened/irritated stop using the CHG and inform your nurse when you arrive at Short Stay. Do not shave (including legs and underarms) for at least 48 hours prior to the first CHG shower.  You may shave your face/neck. Please follow these instructions carefully:  1.  Shower with CHG Soap the night before surgery and the  morning of Surgery.  2.  If you choose to wash your hair, wash your hair first as usual with your  normal  shampoo.  3.  After you shampoo, rinse your hair and body thoroughly to remove the  shampoo.                           4.  Use CHG as you would any other liquid soap.  You can apply chg directly  to the skin and wash                       Gently with a scrungie or clean washcloth.  5.  Apply the CHG Soap to your body ONLY FROM THE NECK DOWN.   Do not use  on face/ open                           Wound or open sores. Avoid contact with eyes, ears mouth and genitals (private parts).                       Wash face,  Genitals (private parts) with your normal soap.             6.  Wash thoroughly, paying special attention to the area where your surgery  will be performed.  7.  Thoroughly rinse your body with warm water from the neck down.  8.  DO NOT shower/wash with your normal soap after using and rinsing off  the CHG Soap.                9.  Pat yourself dry with a clean towel.            10.  Wear clean pajamas.            11.  Place clean sheets on your bed the night of your first shower and do not  sleep with pets. Day of Surgery : Do not apply any lotions/deodorants the morning of surgery.  Please wear clean clothes to the hospital/surgery center.  FAILURE TO FOLLOW THESE INSTRUCTIONS MAY RESULT IN THE  CANCELLATION OF YOUR SURGERY PATIENT SIGNATURE_________________________________  NURSE SIGNATURE__________________________________  ________________________________________________________________________

## 2015-01-21 NOTE — H&P (Signed)
History of Present Illness   Nathan Nathan Buchanan presents today as an acute walk-in with concerns over issues with his penile prosthesis. He underwent implantation of a 3-piece inflatable penile prosthesis back in April 2011. He has had the prosthesis for almost 5 years. This has functioned extremely well for him. He has had no major issues or problems until recently. He began to notice, as well as his fiance, that there appeared some extrusion near the distal end of his penis and that the prosthesis appeared to be trying to erode through the skin. He has also noticed some spraying of his urinary stream, which his a relatively new phenomenon. He has always had some fairly longstanding voiding symptoms. He has had no other obvious systemic issues. He has noticed some increase in penile tenderness. Complicating factors include a recent CVA that he experienced in December. He woke up with right-sided hemiparesis. I do not have the specifics on his stroke. He has been started on a baby aspirin. He does not have diabetes mellitus. Again, he had longstanding erectile dysfunction prior to placement of his penile prosthesis. Again, it had otherwise functioned well u     Past Medical History Problems  1. History of Arthritis 2. History of Bipolar Disorder 3. History of depression (Z86.59) 4. History of esophageal reflux (Z87.19) 5. History of glaucoma (Z86.69) 6. History of hepatic disease (Z87.19) 7. History of hepatitis (Z86.19) 8. History of hypercholesterolemia (Z86.39) 9. History of hypertension (Z86.79) 10. History of stroke (Z61.09(Z86.73)  Surgical History Problems  1. History of Surg Penis Insertion Of Penile Prosthesis  Current Meds 1. Aspirin 81 MG Oral Tablet;  Therapy: (Recorded:14Jan2016) to Recorded 2. HYDROCHLOROTHIA TAB;  Therapy: (Recorded:24Jun2010) to Recorded 3. Lipitor TABS;  Therapy: (Recorded:14Jan2016) to Recorded 4. OxyCODONE HCl CAPS;  Therapy: (Recorded:14Jan2016) to  Recorded  Allergies Medication  1. Flomax CP24  Family History Problems  1. Family history of Acute Myocardial Infarction : Father 2. Family history of Diabetes Mellitus : Mother 3. Family history of Family Health Status Number Of Children : Father   three sons and three daugthers 4. Family history of Father Deceased At Age ____ : Mother   [id number] 5. Family history of Mother Deceased At Age ____   885  Social History Problems  1. Denied: History of Alcohol Use 2. Denied: History of Caffeine Use 3. Former smoker [phone](Z87.891) 4. Marital History - Currently Married 5. Occupation:   disabled 6. History of Tobacco Use   quit three years ago . a few per day for 11  years  Review of Systems  Genitourinary: urinary frequency, dysuria, nocturia, urinary stream starts and stops, erectile dysfunction and penile pain, but no penile edema.  Gastrointestinal: nausea.    Vitals Vital Signs [Data Includes: Last 1 Day]  Recorded: 14Jan2016 09:28AM  Height: 5 ft 11 in Weight: 170 lb  BMI Calculated: 23.71 BSA Calculated: 1.97 Blood Pressure: 109 / 78 Temperature: 97.3 F Heart Rate: 88  Physical Exam Constitutional: Well nourished and well developed . No acute distress.  Neck: The appearance of the neck is normal and no neck mass is present.  Pulmonary: No respiratory distress and normal respiratory rhythm and effort.  Cardiovascular: Heart rate and rhythm are normal . No peripheral edema.  Abdomen: The abdomen is soft and nontender. No masses are palpated. No CVA tenderness. No hernias are palpable. No hepatosplenomegaly noted.  Genitourinary: Examination of the penis demonstrates tenderness on palpation and a penile prosthesis, but no swelling. The scrotum is normal in  appearance. The right testis is palpably normal. The left testis is normal.    Results/Data Urine [Data Includes: Last 1 Day]   14Jan2016  COLOR YELLOW   APPEARANCE CLEAR   SPECIFIC GRAVITY 1.025   pH 5.5    GLUCOSE NEG mg/dL  BILIRUBIN NEG   KETONE NEG mg/dL  BLOOD SMALL   PROTEIN NEG mg/dL  UROBILINOGEN 0.2 mg/dL  NITRITE NEG   LEUKOCYTE ESTERASE SMALL   SQUAMOUS EPITHELIAL/HPF RARE   WBC 21-50 WBC/hpf  RBC 0-2 RBC/hpf  BACTERIA FEW   CRYSTALS NONE SEEN   CASTS NONE SEEN   Other MUCUS NOTED    Assessment Assessed  1. Erosion of penile prosthesis (T83.498A) 2. Disorder of implanted penile prosthesis (T85.9XXA)  Plan Erosion of penile prosthesis  1. Start: Cephalexin 500 MG Oral Tablet (Cephalexin Monohydrate); TAKE 1 TABLET EVERY  8 HOURS UNTIL ALL TAKEN Health Maintenance  2. UA With REFLEX; [Do Not Release]; Status:Resulted - Requires Verification;   Done:  14Jan2016 09:16AM  Discussion/Summary   Nathan Buchanan had evidence of erosion of his penile prosthesis. Both corporeal cylinders can be palpated just underneath the skin of the glans penis. It is possible that these have eroded distally to the corporeal tips, but I suspect both of these erosions have occurred through the urethra. He had some tenderness but does not have gross purulence at this time. There is no evidence of obvious cellulitis, scrotal abscess, or any real severe acute infection. Undoubtedly, he has significant colonization. The left corporeal cylinder appears to be ready to erode out of the skin at any time. This situation does require explantation of the prosthesis. We will need to carefully evaluate the distal urethra. He may require an indwelling catheter for several weeks to allow for urethral healing. We will need to consider the potential for placing a Penrose drain within the corporeal cylinder and copious irrigation will need to be performed. This is clearly not a situation where we feel that a salvage procedure can be undertaken, given both infection and the erosion. Explantation will be required. Nathan Buchanan and his fiance are told that reimplantation of a prosthesis is still potentially a possibility in 3-4  months if there is adequate healing, but that that procedure can be potentially very difficult or impossible due to corporeal scarring and obviously there would be a higher risk of recurrent erosion in a situation like this. We will start him on oral antibiotics. We will need to get medical clearance for him to undergo a procedure, given his recent CVA. There are really no other good options and he definitely needs explantation of this prosthesis. We would prefer it if it would be safe to discontinue aspirin for a few days, but if it is necessary that he remains on it, then certainly we can do the procedure on the aspirin if necessary. We did ask Dr Vernie Ammons, our partner, to evaluate him as well, who concurs with these finds and plan for explantation.

## 2015-01-24 ENCOUNTER — Ambulatory Visit (HOSPITAL_COMMUNITY): Payer: Medicare Other | Admitting: Anesthesiology

## 2015-01-24 ENCOUNTER — Encounter (HOSPITAL_COMMUNITY)
Admission: RE | Disposition: A | Discharge status: Home / Self Care | Payer: Self-pay | Source: Ambulatory Visit | Attending: Urology

## 2015-01-24 ENCOUNTER — Ambulatory Visit (HOSPITAL_COMMUNITY)
Admission: RE | Admit: 2015-01-24 | Discharge: 2015-01-24 | Disposition: A | Discharge status: Home / Self Care | Payer: Medicare Other | Source: Ambulatory Visit | Attending: Urology | Admitting: Urology

## 2015-01-24 ENCOUNTER — Encounter (HOSPITAL_COMMUNITY): Payer: Self-pay | Admitting: *Deleted

## 2015-01-24 DIAGNOSIS — F319 Bipolar disorder, unspecified: Secondary | ICD-10-CM | POA: Insufficient documentation

## 2015-01-24 DIAGNOSIS — I10 Essential (primary) hypertension: Secondary | ICD-10-CM | POA: Diagnosis not present

## 2015-01-24 DIAGNOSIS — M1990 Unspecified osteoarthritis, unspecified site: Secondary | ICD-10-CM | POA: Diagnosis not present

## 2015-01-24 DIAGNOSIS — Z8673 Personal history of transient ischemic attack (TIA), and cerebral infarction without residual deficits: Secondary | ICD-10-CM | POA: Diagnosis not present

## 2015-01-24 DIAGNOSIS — Z7982 Long term (current) use of aspirin: Secondary | ICD-10-CM | POA: Diagnosis not present

## 2015-01-24 DIAGNOSIS — E780 Pure hypercholesterolemia: Secondary | ICD-10-CM | POA: Insufficient documentation

## 2015-01-24 DIAGNOSIS — T83498A Other mechanical complication of other prosthetic devices, implants and grafts of genital tract, initial encounter: Secondary | ICD-10-CM | POA: Diagnosis not present

## 2015-01-24 DIAGNOSIS — K219 Gastro-esophageal reflux disease without esophagitis: Secondary | ICD-10-CM | POA: Insufficient documentation

## 2015-01-24 DIAGNOSIS — T8389XA Other specified complication of genitourinary prosthetic devices, implants and grafts, initial encounter: Secondary | ICD-10-CM | POA: Insufficient documentation

## 2015-01-24 DIAGNOSIS — T836XXA Infection and inflammatory reaction due to prosthetic device, implant and graft in genital tract, initial encounter: Secondary | ICD-10-CM | POA: Diagnosis not present

## 2015-01-24 DIAGNOSIS — Z87891 Personal history of nicotine dependence: Secondary | ICD-10-CM | POA: Insufficient documentation

## 2015-01-24 HISTORY — PX: PENILE PROSTHESIS IMPLANT: SHX240

## 2015-01-24 HISTORY — PX: CYSTOSCOPY: SHX5120

## 2015-01-24 SURGERY — CYSTOSCOPY, FLEXIBLE
Anesthesia: General

## 2015-01-24 MED ORDER — LACTATED RINGERS IV SOLN
INTRAVENOUS | Status: DC | PRN
  Administered 2015-01-24 (×2): via INTRAVENOUS

## 2015-01-24 MED ORDER — LIDOCAINE HCL (CARDIAC) 20 MG/ML IV SOLN
INTRAVENOUS | Status: DC | PRN
  Administered 2015-01-24: 100 mg via INTRAVENOUS

## 2015-01-24 MED ORDER — DEXAMETHASONE SODIUM PHOSPHATE 10 MG/ML IJ SOLN
INTRAMUSCULAR | Status: DC | PRN
  Administered 2015-01-24: 10 mg via INTRAVENOUS

## 2015-01-24 MED ORDER — DEXAMETHASONE SODIUM PHOSPHATE 10 MG/ML IJ SOLN
INTRAMUSCULAR | Status: AC
  Filled 2015-01-24: qty 1

## 2015-01-24 MED ORDER — LIDOCAINE HCL (CARDIAC) 20 MG/ML IV SOLN
INTRAVENOUS | Status: AC
  Filled 2015-01-24: qty 5

## 2015-01-24 MED ORDER — FENTANYL CITRATE 0.05 MG/ML IJ SOLN
INTRAMUSCULAR | Status: AC
  Filled 2015-01-24: qty 2

## 2015-01-24 MED ORDER — ONDANSETRON HCL 4 MG/2ML IJ SOLN
INTRAMUSCULAR | Status: AC
  Filled 2015-01-24: qty 2

## 2015-01-24 MED ORDER — SODIUM CHLORIDE 0.9 % IR SOLN
Status: AC
  Filled 2015-01-24: qty 1

## 2015-01-24 MED ORDER — FENTANYL CITRATE 0.05 MG/ML IJ SOLN
INTRAMUSCULAR | Status: DC | PRN
  Administered 2015-01-24: 50 ug via INTRAVENOUS
  Administered 2015-01-24: 25 ug via INTRAVENOUS
  Administered 2015-01-24: 50 ug via INTRAVENOUS
  Administered 2015-01-24: 25 ug via INTRAVENOUS
  Administered 2015-01-24: 50 ug via INTRAVENOUS

## 2015-01-24 MED ORDER — HYDROCODONE-ACETAMINOPHEN 5-325 MG PO TABS: 1.0000 | ORAL_TABLET | Freq: Four times a day (QID) | ORAL | Status: DC | PRN

## 2015-01-24 MED ORDER — PROPOFOL 10 MG/ML IV BOLUS
INTRAVENOUS | Status: AC
  Filled 2015-01-24: qty 20

## 2015-01-24 MED ORDER — FENTANYL CITRATE 0.05 MG/ML IJ SOLN
25.0000 ug | INTRAMUSCULAR | Status: DC | PRN
  Administered 2015-01-24: 25 ug via INTRAVENOUS

## 2015-01-24 MED ORDER — ONDANSETRON HCL 4 MG/2ML IJ SOLN
INTRAMUSCULAR | Status: DC | PRN
  Administered 2015-01-24: 4 mg via INTRAVENOUS

## 2015-01-24 MED ORDER — CEFAZOLIN SODIUM-DEXTROSE 2-3 GM-% IV SOLR
INTRAVENOUS | Status: AC
  Filled 2015-01-24: qty 50

## 2015-01-24 MED ORDER — PROPOFOL 10 MG/ML IV BOLUS
INTRAVENOUS | Status: DC | PRN
  Administered 2015-01-24: 200 mg via INTRAVENOUS

## 2015-01-24 MED ORDER — CEFAZOLIN SODIUM-DEXTROSE 2-3 GM-% IV SOLR
2.0000 g | INTRAVENOUS | Status: AC
  Administered 2015-01-24: 2 g via INTRAVENOUS

## 2015-01-24 MED ORDER — PROMETHAZINE HCL 25 MG/ML IJ SOLN: 6.2500 mg | INTRAMUSCULAR | Status: DC | PRN

## 2015-01-24 MED ORDER — CEPHALEXIN 500 MG PO CAPS: 500.0000 mg | ORAL_CAPSULE | Freq: Three times a day (TID) | ORAL | Status: DC

## 2015-01-24 MED ORDER — SODIUM CHLORIDE 0.9 % IR SOLN
Status: DC | PRN
  Administered 2015-01-24: 500 mL

## 2015-01-24 SURGICAL SUPPLY — 36 items
BAG URINE DRAINAGE (UROLOGICAL SUPPLIES) ×3 IMPLANT
BANDAGE COBAN STERILE 2 (GAUZE/BANDAGES/DRESSINGS) IMPLANT
BENZOIN TINCTURE PRP APPL 2/3 (GAUZE/BANDAGES/DRESSINGS) IMPLANT
BNDG GAUZE ELAST 4 BULKY (GAUZE/BANDAGES/DRESSINGS) IMPLANT
CATH FOLEY 2WAY SLVR  5CC 16FR (CATHETERS) ×2
CATH FOLEY 2WAY SLVR 5CC 16FR (CATHETERS) ×1 IMPLANT
CATH ROBINSON RED A/P 16FR (CATHETERS) ×3 IMPLANT
CLOSURE WOUND 1/2 X4 (GAUZE/BANDAGES/DRESSINGS)
COVER MAYO STAND STRL (DRAPES) IMPLANT
COVER SURGICAL LIGHT HANDLE (MISCELLANEOUS) ×3 IMPLANT
DISSECTOR ROUND CHERRY 3/8 STR (MISCELLANEOUS) ×3 IMPLANT
DRAIN PENROSE 18X1/4 LTX STRL (WOUND CARE) ×3 IMPLANT
DRAPE LAPAROTOMY T 102X78X121 (DRAPES) ×3 IMPLANT
DRSG TEGADERM 4X4.75 (GAUZE/BANDAGES/DRESSINGS) IMPLANT
FLOSEAL 10ML (HEMOSTASIS) ×3 IMPLANT
GAUZE SPONGE 4X4 12PLY STRL (GAUZE/BANDAGES/DRESSINGS) ×3 IMPLANT
GLOVE BIOGEL M STRL SZ7.5 (GLOVE) ×3 IMPLANT
GOWN STRL REUS W/TWL XL LVL3 (GOWN DISPOSABLE) ×3 IMPLANT
KIT BASIN OR (CUSTOM PROCEDURE TRAY) ×3 IMPLANT
NS IRRIG 1000ML POUR BTL (IV SOLUTION) IMPLANT
PACK GENERAL/GYN (CUSTOM PROCEDURE TRAY) ×3 IMPLANT
PEN SKIN MARKING BROAD (MISCELLANEOUS) ×3 IMPLANT
PLUG CATH AND CAP STER (CATHETERS) ×3 IMPLANT
RETRACTOR WILSON SYSTEM (INSTRUMENTS) IMPLANT
SPONGE LAP 4X18 X RAY DECT (DISPOSABLE) IMPLANT
STRIP CLOSURE SKIN 1/2X4 (GAUZE/BANDAGES/DRESSINGS) IMPLANT
SUPPORT SCROTAL LG STRP (MISCELLANEOUS) IMPLANT
SUPPORTER ATHLETIC LG (MISCELLANEOUS)
SURGILUBE 3G PEEL PACK STRL (MISCELLANEOUS) IMPLANT
SUT ETHILON 2 0 PS N (SUTURE) ×6 IMPLANT
SUT VIC AB 2-0 UR5 27 (SUTURE) ×6 IMPLANT
SYR 20CC LL (SYRINGE) IMPLANT
SYR 50ML LL SCALE MARK (SYRINGE) IMPLANT
SYR CONTROL 10ML LL (SYRINGE) IMPLANT
TAPE CLOTH SURG 4X10 WHT LF (GAUZE/BANDAGES/DRESSINGS) ×3 IMPLANT
WATER STERILE IRR 1500ML POUR (IV SOLUTION) IMPLANT

## 2015-01-24 NOTE — Anesthesia Postprocedure Evaluation (Signed)
  Anesthesia Post-op Note  Patient: Nathan Buchanan  Procedure(s) Performed: Procedure(s) (LRB): CYSTOSCOPY FLEXIBLE (N/A) EXPLANTATION OF PENILE PROSTHESIS (N/A)  Patient Location: PACU  Anesthesia Type: General  Level of Consciousness: awake and alert   Airway and Oxygen Therapy: Patient Spontanous Breathing  Post-op Pain: mild  Post-op Assessment: Post-op Vital signs reviewed, Patient's Cardiovascular Status Stable, Respiratory Function Stable, Patent Airway and No signs of Nausea or vomiting  Last Vitals:  Filed Vitals:   01/24/15 1102  BP: 129/83  Pulse: 73  Temp: 36.2 C  Resp: 16    Post-op Vital Signs: stable   Complications: No apparent anesthesia complications

## 2015-01-24 NOTE — Discharge Instructions (Signed)
Penile prosthesis postoperative instructions  Wound:  In most cases your incision will have absorbable sutures that will dissolve within the first 10-20 days. Some will fall out even earlier. Expect some redness as the sutures dissolved but this should occur only around the sutures. If there is generalized redness, especially with increasing pain or swelling, let us know. The scrotum and penis will very likely get "black and blue" as the blood in the tissues spread. Sometimes the whole scrotum will turn colors. The black and blue is followed by a yellow and brown color. In time, all the discoloration will go away.   Diet:  You may return to your normal diet within 24 hours following your surgery. You may note some mild nausea and possibly vomiting the first 6-8 hours following surgery. This is usually due to the side effects of anesthesia, and will disappear quite soon. I would suggest clear liquids and a very light meal the first evening following your surgery.  Activity:  Your physical activity should be restricted the first 48 hours. During that time you should remain relatively inactive, moving about only when necessary. During the first 7-10 days following surgery he should avoid lifting any heavy objects (anything greater than 15 pounds), and avoid strenuous exercise. If you work, ask us specifically about your restrictions, both for work and home. We will write a note to your employer if needed.  You should plan to wear a tight pair of jockey shorts or an athletic supporter for the first 4-5 days, even to sleep. This will keep the scrotum immobilized to some degree and keep the swelling down.  Ice packs should be placed on and off over the scrotum for the first 48 hours. Frozen peas or corn in a ZipLock bag can be frozen, used and re-frozen. Fifteen minutes on and 15 minutes off is a reasonable schedule. The ice is a good pain reliever and keeps the swelling down.  Hygiene:  You may shower  48 hours after your surgery. Tub bathing should be restricted until the seventh day.  Medication:  You will be sent home with some type of pain medication. In many cases you will be sent home with a narcotic pain pill (Vicodin or Tylox). If the pain is not too bad, you may take either Tylenol (acetaminophen) or Advil (ibuprofen) which contain no narcotic agents, and might be tolerated a little better, with fewer side effects. If the pain medication you are sent home with does not control the pain, you will have to let us know. Some narcotic pain medications cannot be given or refilled by a phone call to a pharmacy.  Problems you should report to us:   Fever of 101.0 degrees Fahrenheit or greater.  Moderate or severe swelling under the skin incision or involving the scrotum. Drug reaction such as hives, a rash, nausea or vomiting.

## 2015-01-24 NOTE — Transfer of Care (Signed)
Immediate Anesthesia Transfer of Care Note  Patient: Nathan Buchanan  Procedure(s) Performed: Procedure(s) with comments: CYSTOSCOPY FLEXIBLE (N/A) -   **EXPLORATION OF PENILE PROSTHESIS**  **OUTPATIENT WITH OBSERVATION - POSSIBLE**     EXPLORATION OF PENILE PROSTHESIS (N/A)  Patient Location: PACU  Anesthesia Type:General  Level of Consciousness: sedated  Airway & Oxygen Therapy: Patient Spontanous Breathing and Patient connected to face mask oxygen  Post-op Assessment: Report given to RN and Post -op Vital signs reviewed and stable  Post vital signs: Reviewed and stable  Last Vitals:  Filed Vitals:   01/24/15 0527  BP: 124/84  Pulse: 89  Temp: 36.3 C  Resp: 16    Complications: No apparent anesthesia complications

## 2015-01-24 NOTE — Anesthesia Preprocedure Evaluation (Signed)
Anesthesia Evaluation  Patient identified by MRN, date of birth, ID band Patient awake    Reviewed: Allergy & Precautions, NPO status , Patient's Chart, lab work & pertinent test results  Airway Mallampati: II  TM Distance: >3 FB Neck ROM: Full    Dental no notable dental hx.    Pulmonary former smoker,  breath sounds clear to auscultation  Pulmonary exam normal       Cardiovascular Exercise Tolerance: Good hypertension, Pt. on medications + dysrhythmias Rhythm:Regular Rate:Normal     Neuro/Psych PSYCHIATRIC DISORDERS Depression Bipolar Disorder CVA    GI/Hepatic GERD-  ,(+) Hepatitis -  Endo/Other  negative endocrine ROS  Renal/GU negative Renal ROS  negative genitourinary   Musculoskeletal  (+) Arthritis -,   Abdominal   Peds negative pediatric ROS (+)  Hematology negative hematology ROS (+)   Anesthesia Other Findings   Reproductive/Obstetrics negative OB ROS                             Anesthesia Physical Anesthesia Plan  ASA: III  Anesthesia Plan: General   Post-op Pain Management:    Induction: Intravenous  Airway Management Planned: LMA  Additional Equipment:   Intra-op Plan:   Post-operative Plan: Extubation in OR  Informed Consent: I have reviewed the patients History and Physical, chart, labs and discussed the procedure including the risks, benefits and alternatives for the proposed anesthesia with the patient or authorized representative who has indicated his/her understanding and acceptance.   Dental advisory given  Plan Discussed with: CRNA  Anesthesia Plan Comments:         Anesthesia Quick Evaluation

## 2015-01-24 NOTE — Op Note (Signed)
Preoperative diagnosis: Eroded penile prosthesis Postoperative diagnosis: Same  Procedure: Explantation of 3 piece inflatable penile prosthesis , flexible cystoscopy Surgeon: Valetta Fulleravid S. Ata Pecha M.D.  Anesthesia: Gen.  Indications: Mr. Clydene PughWoodard had implantation of a 3 piece inflatable penile prosthesis approximate 5 years ago. He recently presented with some question of extrusion at the distal end of his penis. He noticed some spraying of his urinary stream and some mild discomfort at the distal aspect of his penis. He has made a reasonable recovery. On clinical examination he had evidence of erosion of both corporal tips. The tips were palpable within the glans penis. There did not appear to be obvious exposure of the prosthesis within the urethra but there was concern that there may be a urethral erosion. There was no gross evidence of a purulent infection and the patient was in no way toxic. We felt it was highly likely that this was at least colonized. The patient received medical clearance from his primary care provider and neurologist. They felt it would be safer to continue him on his daily aspirin. He presents now for explantation and evaluation of the urethra.     Technique and findings: Patient was brought the operating room where he had successful induction of general anesthesia. He was placed in the supine position and prepped and draped in usual manner. He received perioperative antibiotic dosing and placement of PAS compression boots. Appropriate surgical timeout was performed. The previous infrapubic incision was reopened. Cautery was used to go through scar tissue to expose the underlying corporal cylinders. The tubing was located and utilizing cautery we followed the tubing to the reservoir and also to the scrotal pump. These were both removed. There was no evidence of gross infection/. The corporal cylinders were opened and the implants removed bilaterally. Irrigation with red Robinson catheters  demonstrated clear evidence of irrigation into the urethra bilaterally suggesting bilateral urethral erosion.   Possible cystoscopy was performed. One could see evidence of urethral erosion at the 3 and 9:00 positions. Copious irrigation of the corporal cylinders as well as the incision with antibiotic solution was performed. We did utilize some FloSeal for some oozing around the corporotomies worsened by the continued aspirin use. A 16 French Foley catheter was inserted. The subcutaneous layers of the incision were closed with interrupted Vicryl suture. The skin was loosely reapproximated with nylon suture. No obvious Occasions occurred the patient brought to recovery room in stable condition.

## 2015-01-24 NOTE — Interval H&P Note (Signed)
History and Physical Interval Note:  01/24/2015 7:46 AM  Nathan SievertAdolph Eisel  has presented today for surgery, with the diagnosis of ERODED, INFECTED PENILE PROSTHESIS  The various methods of treatment have been discussed with the patient and family. After consideration of risks, benefits and other options for treatment, the patient has consented to  Procedure(s) with comments: CYSTOSCOPY FLEXIBLE (N/A) -   **EXPLORATION OF PENILE PROSTHESIS**  **OUTPATIENT WITH OBSERVATION - POSSIBLE**     EXPLORATION OF PENILE PROSTHESIS (N/A) as a surgical intervention .  The patient's history has been reviewed, patient examined, no change in status, stable for surgery.  I have reviewed the patient's chart and labs.  Questions were answered to the patient's satisfaction.     [name]

## 2015-01-25 ENCOUNTER — Encounter (HOSPITAL_COMMUNITY): Payer: Self-pay | Admitting: Urology

## 2015-02-02 ENCOUNTER — Other Ambulatory Visit: Payer: Self-pay

## 2015-02-02 MED ORDER — OXYCODONE HCL 15 MG PO TABS: 15.0000 mg | ORAL_TABLET | Freq: Four times a day (QID) | ORAL | Status: DC | PRN

## 2015-02-08 ENCOUNTER — Encounter: Payer: Self-pay | Admitting: Internal Medicine

## 2015-02-11 ENCOUNTER — Ambulatory Visit: Payer: Self-pay | Admitting: Neurology

## 2015-02-15 ENCOUNTER — Ambulatory Visit (INDEPENDENT_AMBULATORY_CARE_PROVIDER_SITE_OTHER): Payer: Medicare Other | Admitting: Internal Medicine

## 2015-02-15 ENCOUNTER — Encounter: Payer: Self-pay | Admitting: Internal Medicine

## 2015-02-15 VITALS — BP 102/68 | HR 94 | Temp 97.3°F | Ht 71.0 in | Wt 165.0 lb

## 2015-02-15 DIAGNOSIS — I10 Essential (primary) hypertension: Secondary | ICD-10-CM

## 2015-02-15 DIAGNOSIS — E785 Hyperlipidemia, unspecified: Secondary | ICD-10-CM

## 2015-02-15 DIAGNOSIS — F528 Other sexual dysfunction not due to a substance or known physiological condition: Secondary | ICD-10-CM

## 2015-02-15 DIAGNOSIS — T402X5A Adverse effect of other opioids, initial encounter: Secondary | ICD-10-CM | POA: Insufficient documentation

## 2015-02-15 DIAGNOSIS — G4700 Insomnia, unspecified: Secondary | ICD-10-CM

## 2015-02-15 DIAGNOSIS — K5903 Drug induced constipation: Secondary | ICD-10-CM | POA: Insufficient documentation

## 2015-02-15 DIAGNOSIS — I639 Cerebral infarction, unspecified: Secondary | ICD-10-CM

## 2015-02-15 DIAGNOSIS — K5909 Other constipation: Secondary | ICD-10-CM

## 2015-02-15 MED ORDER — MELATONIN 5 MG PO CAPS: 1.0000 | ORAL_CAPSULE | Freq: Every day | ORAL | Status: DC

## 2015-02-15 MED ORDER — NALOXEGOL OXALATE 25 MG PO TABS: 1.0000 | ORAL_TABLET | Freq: Every day | ORAL | Status: DC

## 2015-02-15 NOTE — Progress Notes (Signed)
Patient ID: Nathan Buchanan, male   DOB: 12-Apr-1956, 59 y.o.   MRN: [id number]    Chief Complaint  Patient presents with  . Medication Management    Patient here today to discuss medications, sleeping medication not working and discuss new rx for viagra   . Constipation    Request for samples to help with constipation related to controlled medications    No Known Allergies  HPI 59 y/o male pt is here for routine visit.  He complaints of having difficulty sleping. Remus Loffler has not helped him. He has stopped using it. He also has been having consitpation. Denies abdominal pain, nausea or vomiting but has uneasiness. Has been keeping himself hydrated.  Compliant with his medications.    blood pressure has been controlled. He has had his penile prosthesis removed. He would like to know if viagra would work for him.   Review of Systems  Constitutional: Negative for fever, chills, malaise/fatigue and diaphoresis.  HENT: Negative for congestion, hearing loss and sore throat.   Eyes: Negative for blurred vision, double vision and discharge. has hx of glaucoma. Wears glasses Respiratory: Negative for cough, sputum production, shortness of breath and wheezing.   Cardiovascular: Negative for chest pain, palpitations, orthopnea and leg swelling.  Gastrointestinal: Negative for heartburn, nausea, vomiting, abdominal pain Genitourinary: Negative for flank pain or dysuria. Musculoskeletal: Negative for falls  Psychiatric/Behavioral: Negative for depression   Past Medical History  Diagnosis Date  . Hypertension   . Arthritis   . Back ache   . Urinary frequency   . Generalized osteoarthrosis, involving multiple sites   . Dermatophytosis of the body   . Unspecified glaucoma   . Cirrhosis of liver without mention of alcohol   . Osteoarthrosis, unspecified whether generalized or localized, unspecified site   . Hyperlipidemia   . Stroke      12'15 -Right sided weakness remains  . Bipolar I  disorder, most recent episode (or current) unspecified     no meds at present  . Chronic hepatitis C without mention of hepatic coma     tx. with meds/ was told" was cured"   Medication reviewed. See [facility]  Physical exam BP 102/68 mmHg  Pulse 94  Temp(Src) 97.3 F (36.3 C) (Oral)  Ht  (1.803 m)  Wt 165 lb (74.844 kg)  BMI 23.02 kg/m2  SpO2 96%  General- adult male in no acute distress Head- atraumatic, normocephalic Eyes- no pallor, no icterus, no discharge Neck- no lymphadenopathy Mouth- normal mucus membrane Cardiovascular- normal s1,s2, no murmurs, normal distal pulses Respiratory- bilateral clear to auscultation, no wheeze, no rhonchi, no crackles Abdomen- bowel sounds present, soft, non tender Musculoskeletal- able to move all 4 extremities, right sided weakness with strength of 4/5, limp on right side while walking, no use of assistive device, no leg edema Neurological- no focal deficit Psychiatry- alert and oriented with normal mood and affect  Labs reviewed   Assessment/plan  1. Essential hypertension Stable bp reading. Continue hctz 50 mg daily - CMP; Future - CBC with Differential; Future  2. Stroke Stable, continue aspirin 325 mg daily, bp med hctz and lipitor 20 mg daily - CBC with Differential; Future  3. ERECTILE DYSFUNCTION Had penile prosthesis, now removed due to infection. Advised patient to follow up with urology for further treatment options  4. Hyperlipidemia Recheck lipid panel prior to next visit. Continue lipitor 20 mg daily - Lipid Panel; Future - CBC with Differential; Future  5. Insomnia Start melatonin 5 mg qhs  for now, sleep hygiene reinforced  6. Constipation due to opioid therapy Start movantik 25 mg daily , hydration encouraged. reassess - CBC with Differential; Future   Oneal GroutMAHIMA Ivori Storr, MD  [facility] [phone] (Monday-Friday 8 am - 5 pm) [phone] (afterhours)

## 2015-02-22 ENCOUNTER — Other Ambulatory Visit: Payer: Self-pay | Admitting: *Deleted

## 2015-02-22 DIAGNOSIS — E785 Hyperlipidemia, unspecified: Secondary | ICD-10-CM

## 2015-02-22 MED ORDER — ATORVASTATIN CALCIUM 20 MG PO TABS: 20.0000 mg | ORAL_TABLET | Freq: Every day | ORAL | Status: DC

## 2015-02-22 NOTE — Telephone Encounter (Signed)
[facility] [place] 

## 2015-02-23 NOTE — Addendum Note (Signed)
Addendum  created 02/23/15 0908 by Sherrian DiversBruce Driana Dazey, MD   Modules edited: Anesthesia Responsible Staff

## 2015-02-25 ENCOUNTER — Emergency Department (INDEPENDENT_AMBULATORY_CARE_PROVIDER_SITE_OTHER)
Admission: EM | Admit: 2015-02-25 | Discharge: 2015-02-25 | Disposition: A | Discharge status: Home / Self Care | Payer: Self-pay | Source: Home / Self Care | Attending: Family Medicine | Admitting: Family Medicine

## 2015-02-25 ENCOUNTER — Encounter (HOSPITAL_COMMUNITY): Payer: Self-pay

## 2015-02-25 DIAGNOSIS — Z041 Encounter for examination and observation following transport accident: Secondary | ICD-10-CM

## 2015-02-25 MED ORDER — CYCLOBENZAPRINE HCL 5 MG PO TABS: 5.0000 mg | ORAL_TABLET | Freq: Three times a day (TID) | ORAL | Status: DC | PRN
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
## 2015-02-25 NOTE — Discharge Instructions (Signed)
Use heat and medicine as needed, see your doctor as planned.

## 2015-03-02 ENCOUNTER — Other Ambulatory Visit: Payer: Self-pay

## 2015-03-02 MED ORDER — OXYCODONE HCL 15 MG PO TABS: 15.0000 mg | ORAL_TABLET | Freq: Four times a day (QID) | ORAL | Status: DC | PRN

## 2015-03-09 ENCOUNTER — Ambulatory Visit: Payer: Self-pay | Admitting: Neurology

## 2015-03-10 ENCOUNTER — Encounter: Payer: Self-pay | Admitting: Neurology

## 2015-03-18 ENCOUNTER — Other Ambulatory Visit: Payer: Self-pay | Admitting: *Deleted

## 2015-03-18 MED ORDER — HYDROCHLOROTHIAZIDE 50 MG PO TABS
50.0000 mg | ORAL_TABLET | Freq: Every day | ORAL | Status: DC
Start: 2015-03-18 — End: 2015-05-20

## 2015-03-18 NOTE — Telephone Encounter (Signed)
[facility] [address] 

## 2015-04-01 ENCOUNTER — Other Ambulatory Visit: Payer: Self-pay

## 2015-04-01 MED ORDER — OXYCODONE HCL 15 MG PO TABS: 15.0000 mg | ORAL_TABLET | Freq: Four times a day (QID) | ORAL | Status: DC | PRN

## 2015-04-01 NOTE — Telephone Encounter (Signed)
Called for refill on Oxycodone last filled 03/02/15

## 2015-04-22 ENCOUNTER — Telehealth: Payer: Self-pay | Admitting: *Deleted

## 2015-04-22 NOTE — Telephone Encounter (Signed)
Jasmine DecemberSharon called patient insurance company and initiated Prior Authorization for [employer] 25mg  #:[phone]  Patient ID#: [id number] Authorization Approved. [facility] [address] Notified.

## 2015-04-29 ENCOUNTER — Other Ambulatory Visit: Payer: Self-pay | Admitting: *Deleted

## 2015-04-29 MED ORDER — OXYCODONE HCL 15 MG PO TABS: 15.0000 mg | ORAL_TABLET | Freq: Four times a day (QID) | ORAL | Status: DC | PRN

## 2015-04-29 NOTE — Telephone Encounter (Signed)
Patient Requested and will pick up 

## 2015-04-29 NOTE — Telephone Encounter (Signed)
Reprinted due to putting wrong provider to sign on Rx.

## 2015-05-05 NOTE — Progress Notes (Signed)
See phone note dated 01/14/15 

## 2015-05-18 ENCOUNTER — Ambulatory Visit: Payer: Self-pay | Admitting: Internal Medicine

## 2015-05-18 DIAGNOSIS — S4991XA Unspecified injury of right shoulder and upper arm, initial encounter: Secondary | ICD-10-CM | POA: Diagnosis not present

## 2015-05-18 DIAGNOSIS — I10 Essential (primary) hypertension: Secondary | ICD-10-CM | POA: Diagnosis not present

## 2015-05-18 DIAGNOSIS — M19011 Primary osteoarthritis, right shoulder: Secondary | ICD-10-CM | POA: Diagnosis not present

## 2015-05-18 DIAGNOSIS — Z79899 Other long term (current) drug therapy: Secondary | ICD-10-CM | POA: Diagnosis not present

## 2015-05-18 DIAGNOSIS — S43401A Unspecified sprain of right shoulder joint, initial encounter: Secondary | ICD-10-CM | POA: Diagnosis not present

## 2015-05-18 DIAGNOSIS — E780 Pure hypercholesterolemia: Secondary | ICD-10-CM | POA: Diagnosis not present

## 2015-05-18 DIAGNOSIS — W1839XA Other fall on same level, initial encounter: Secondary | ICD-10-CM | POA: Diagnosis not present

## 2015-05-18 DIAGNOSIS — Z8673 Personal history of transient ischemic attack (TIA), and cerebral infarction without residual deficits: Secondary | ICD-10-CM | POA: Diagnosis not present

## 2015-05-20 ENCOUNTER — Other Ambulatory Visit: Payer: Self-pay | Admitting: *Deleted

## 2015-05-20 ENCOUNTER — Other Ambulatory Visit: Payer: Self-pay | Admitting: Internal Medicine

## 2015-05-20 ENCOUNTER — Telehealth: Payer: Self-pay

## 2015-05-20 MED ORDER — HYDROCHLOROTHIAZIDE 50 MG PO TABS: 50.0000 mg | ORAL_TABLET | Freq: Every day | ORAL | Status: DC

## 2015-05-20 NOTE — Telephone Encounter (Signed)
Patient called needs refill on Hydrodiuril, pharmacy [facility] has tried to get refill all day, "needs his BP pill" [facility] [address] Done by Synetta FailAnita May, she had received message on triage line for refill from patient.

## 2015-05-20 NOTE — Telephone Encounter (Signed)
Patient requested and faxed to pharmacy 

## 2015-05-30 ENCOUNTER — Other Ambulatory Visit: Payer: Self-pay | Admitting: *Deleted

## 2015-05-30 MED ORDER — OXYCODONE HCL 15 MG PO TABS: 15.0000 mg | ORAL_TABLET | Freq: Four times a day (QID) | ORAL | Status: DC | PRN

## 2015-05-30 NOTE — Telephone Encounter (Signed)
Patient Requested and will pick up 

## 2015-06-03 ENCOUNTER — Encounter: Payer: Self-pay | Admitting: Internal Medicine

## 2015-06-03 ENCOUNTER — Ambulatory Visit (INDEPENDENT_AMBULATORY_CARE_PROVIDER_SITE_OTHER): Payer: Medicare Other | Admitting: Internal Medicine

## 2015-06-03 VITALS — BP 120/86 | HR 101 | Temp 97.7°F | Resp 18 | Ht 71.0 in | Wt 156.2 lb

## 2015-06-03 DIAGNOSIS — I639 Cerebral infarction, unspecified: Secondary | ICD-10-CM

## 2015-06-03 DIAGNOSIS — W19XXXA Unspecified fall, initial encounter: Secondary | ICD-10-CM

## 2015-06-03 DIAGNOSIS — Y92099 Unspecified place in other non-institutional residence as the place of occurrence of the external cause: Secondary | ICD-10-CM | POA: Diagnosis not present

## 2015-06-03 DIAGNOSIS — M7501 Adhesive capsulitis of right shoulder: Secondary | ICD-10-CM

## 2015-06-03 DIAGNOSIS — K5909 Other constipation: Secondary | ICD-10-CM | POA: Diagnosis not present

## 2015-06-03 DIAGNOSIS — I10 Essential (primary) hypertension: Secondary | ICD-10-CM | POA: Diagnosis not present

## 2015-06-03 DIAGNOSIS — G4700 Insomnia, unspecified: Secondary | ICD-10-CM

## 2015-06-03 DIAGNOSIS — R634 Abnormal weight loss: Secondary | ICD-10-CM

## 2015-06-03 DIAGNOSIS — T402X5A Adverse effect of other opioids, initial encounter: Secondary | ICD-10-CM

## 2015-06-03 DIAGNOSIS — E785 Hyperlipidemia, unspecified: Secondary | ICD-10-CM

## 2015-06-03 DIAGNOSIS — K5903 Drug induced constipation: Secondary | ICD-10-CM

## 2015-06-03 DIAGNOSIS — E039 Hypothyroidism, unspecified: Secondary | ICD-10-CM | POA: Diagnosis not present

## 2015-06-03 DIAGNOSIS — I69851 Hemiplegia and hemiparesis following other cerebrovascular disease affecting right dominant side: Secondary | ICD-10-CM

## 2015-06-03 DIAGNOSIS — I69351 Hemiplegia and hemiparesis following cerebral infarction affecting right dominant side: Secondary | ICD-10-CM

## 2015-06-03 DIAGNOSIS — Y92009 Unspecified place in unspecified non-institutional (private) residence as the place of occurrence of the external cause: Secondary | ICD-10-CM

## 2015-06-03 MED ORDER — MELATONIN 5 MG PO CAPS: 1.0000 | ORAL_CAPSULE | Freq: Every day | ORAL | Status: DC
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
## 2015-06-03 NOTE — Patient Instructions (Addendum)
Will call with orthopedic referral  Will call with lab results  Drink ensure/boost at least 2 times daily to supplement meal  Follow up in 3 mos for routine visit

## 2015-06-04 LAB — CBC WITH DIFFERENTIAL/PLATELET
BASOS: 1 %
Basophils Absolute: 0.1 10*3/uL (ref 0.0–0.2)
EOS (ABSOLUTE): 0.1 10*3/uL (ref 0.0–0.4)
EOS: 1 %
Hematocrit: 42.6 % (ref 37.5–51.0)
Hemoglobin: 14.7 g/dL (ref 12.6–17.7)
IMMATURE GRANS (ABS): 0 10*3/uL (ref 0.0–0.1)
Immature Granulocytes: 0 %
LYMPHS ABS: 6.2 10*3/uL — AB (ref 0.7–3.1)
Lymphs: 69 %
MCH: 30.4 pg (ref 26.6–33.0)
MCHC: 34.5 g/dL (ref 31.5–35.7)
MCV: 88 fL (ref 79–97)
MONOS ABS: 0.7 10*3/uL (ref 0.1–0.9)
Monocytes: 8 %
Neutrophils Absolute: 1.9 10*3/uL (ref 1.4–7.0)
Neutrophils: 21 %
Platelets: 219 10*3/uL (ref 150–379)
RBC: 4.83 x10E6/uL (ref 4.14–5.80)
RDW: 14.2 % (ref 12.3–15.4)
WBC: 9 10*3/uL (ref 3.4–10.8)

## 2015-06-04 LAB — T4, FREE: Free T4: 1.1 ng/dL (ref 0.82–1.77)

## 2015-06-04 LAB — COMPREHENSIVE METABOLIC PANEL
ALK PHOS: 83 IU/L (ref 39–117)
ALT: 31 IU/L (ref 0–44)
AST: 31 IU/L (ref 0–40)
Albumin/Globulin Ratio: 1.2 (ref 1.1–2.5)
Albumin: 4.4 g/dL (ref 3.5–5.5)
BUN/Creatinine Ratio: 14 (ref 9–20)
BUN: 14 mg/dL (ref 6–24)
Bilirubin Total: 0.4 mg/dL (ref 0.0–1.2)
CO2: 27 mmol/L (ref 18–29)
Calcium: 9.8 mg/dL (ref 8.7–10.2)
Chloride: 97 mmol/L (ref 97–108)
Creatinine, Ser: 1.02 mg/dL (ref 0.76–1.27)
GFR calc Af Amer: 93 mL/min/{1.73_m2} (ref >59)
GFR calc non Af Amer: 81 mL/min/{1.73_m2} (ref >59)
Globulin, Total: 3.8 g/dL (ref 1.5–4.5)
Glucose: 98 mg/dL (ref 65–99)
Potassium: 4.1 mmol/L (ref 3.5–5.2)
SODIUM: 139 mmol/L (ref 134–144)
Total Protein: 8.2 g/dL (ref 6.0–8.5)

## 2015-06-04 LAB — LIPID PANEL
CHOLESTEROL TOTAL: 180 mg/dL (ref 100–199)
Chol/HDL Ratio: 4.3 ratio units (ref 0.0–5.0)
HDL: 42 mg/dL (ref >39)
LDL Calculated: 100 mg/dL — ABNORMAL HIGH (ref 0–99)
Triglycerides: 189 mg/dL — ABNORMAL HIGH (ref 0–149)
VLDL CHOLESTEROL CAL: 38 mg/dL (ref 5–40)

## 2015-06-04 LAB — TSH: TSH: 1.44 u[IU]/mL (ref 0.450–4.500)

## 2015-06-04 LAB — T3: T3, Total: 96 ng/dL (ref 71–180)

## 2015-06-29 ENCOUNTER — Other Ambulatory Visit: Payer: Self-pay | Admitting: *Deleted

## 2015-06-29 MED ORDER — OXYCODONE HCL 15 MG PO TABS: 15.0000 mg | ORAL_TABLET | Freq: Four times a day (QID) | ORAL | Status: DC | PRN

## 2015-06-29 NOTE — Telephone Encounter (Signed)
Patient requested and will pick up 

## 2015-07-07 ENCOUNTER — Other Ambulatory Visit: Payer: Self-pay

## 2015-07-07 DIAGNOSIS — E785 Hyperlipidemia, unspecified: Secondary | ICD-10-CM

## 2015-07-07 MED ORDER — ATORVASTATIN CALCIUM 20 MG PO TABS: 20.0000 mg | ORAL_TABLET | Freq: Every day | ORAL | Status: DC

## 2015-07-07 NOTE — Telephone Encounter (Signed)
Pharmacy called requesting rx for 90 day supply for patient vs 30 day supply

## 2015-07-08 ENCOUNTER — Other Ambulatory Visit: Payer: Medicare Other

## 2015-07-12 ENCOUNTER — Ambulatory Visit: Payer: Medicare Other | Admitting: Internal Medicine

## 2015-07-13 ENCOUNTER — Encounter: Payer: Self-pay | Admitting: Internal Medicine

## 2015-07-13 ENCOUNTER — Ambulatory Visit (INDEPENDENT_AMBULATORY_CARE_PROVIDER_SITE_OTHER): Payer: Medicare Other | Admitting: Internal Medicine

## 2015-07-13 VITALS — BP 110/70 | HR 73 | Temp 97.7°F | Resp 20 | Ht 71.0 in | Wt 157.0 lb

## 2015-07-13 DIAGNOSIS — M7501 Adhesive capsulitis of right shoulder: Secondary | ICD-10-CM

## 2015-07-13 DIAGNOSIS — I69351 Hemiplegia and hemiparesis following cerebral infarction affecting right dominant side: Secondary | ICD-10-CM

## 2015-07-13 DIAGNOSIS — G4700 Insomnia, unspecified: Secondary | ICD-10-CM

## 2015-07-13 DIAGNOSIS — M150 Primary generalized (osteo)arthritis: Secondary | ICD-10-CM | POA: Diagnosis not present

## 2015-07-13 DIAGNOSIS — I69851 Hemiplegia and hemiparesis following other cerebrovascular disease affecting right dominant side: Secondary | ICD-10-CM | POA: Diagnosis not present

## 2015-07-13 DIAGNOSIS — M159 Polyosteoarthritis, unspecified: Secondary | ICD-10-CM

## 2015-07-13 MED ORDER — DIAZEPAM 5 MG PO TABS: 5.0000 mg | ORAL_TABLET | Freq: Every day | ORAL | Status: DC

## 2015-07-13 MED ORDER — DIAZEPAM 5 MG PO TABS
5.0000 mg | ORAL_TABLET | Freq: Every day | ORAL | Status: DC
Start: 2015-07-13 — End: 2015-09-09

## 2015-07-13 NOTE — Patient Instructions (Signed)
Continue current medications as ordered  Follow up in September as scheduled and as needed  Keep appointment with Ortho as scheduled

## 2015-07-13 NOTE — Progress Notes (Signed)
Patient ID: Nathan Buchanan, male   DOB: [birth year], 59 y.o.   MRN: [id number]    Location:    [facility]   Place of Service:   OFFICE  Chief Complaint  Patient presents with  . Acute Visit    unable to sleep well at night     HPI:  59 yo male seen today for difficulty falling and staying asleep due to right shoulder pain and left hip pain. Pain is toothache sensation and is 6/10 on scale. Worsens with lying on affected sign and improves with lying on his back. He tried souse's xanax last night which helped him to sleep better. He does not take melatonin as it was ineffective. Scheduled to see Ortho on July 29th for frozen right shoulder. He has generalized OA. Oxy IR helps pain but does nothing to help sleep at night. He is out of muscle relaxers. He has chronic right sided weakness since his stroke.  Past Medical History  Diagnosis Date  . Hypertension   . Arthritis   . Back ache   . Urinary frequency   . Generalized osteoarthrosis, involving multiple sites   . Dermatophytosis of the body   . Unspecified glaucoma   . Cirrhosis of liver without mention of alcohol   . Osteoarthrosis, unspecified whether generalized or localized, unspecified site   . Hyperlipidemia   . Stroke      12'15 -Right sided weakness remains  . Bipolar I disorder, most recent episode (or current) unspecified     no meds at present  . Chronic hepatitis C without mention of hepatic coma     tx. with meds/ was told" was cured"    Past Surgical History  Procedure Laterality Date  . Penile prosthesis implant  04/12/2010    Dr.Grapey, Insertion of 3 peice penile prosthesis   . Anal fissure repair  2008  . Abcess drainage      Gluteal MRSA drainage   . Colonoscopy  2008    Dr.Edwards, Internal Hemorrhoids   . Cystoscopy N/A 01/24/2015    Procedure: CYSTOSCOPY FLEXIBLE;  Surgeon: Valetta Fuller, MD;  Location: [facility];  Service: Urology;  Laterality: N/A;  . Penile prosthesis implant N/A 01/24/2015    Procedure:  EXPLANTATION OF PENILE PROSTHESIS;  Surgeon: Valetta Fuller, MD;  Location: [facility];  Service: Urology;  Laterality: N/A;    Patient Care Team: Kirt Boys, DO as PCP - General (Internal Medicine) Marvel Plan, MD as Consulting Physician (Neurology)  History   Social History  . Marital Status: Single    Spouse Name: N/A  . Number of Children: N/A  . Years of Education: N/A   Occupational History  . Not on file.   Social History Main Topics  . Smoking status: Former Smoker -- 5 years    Quit date: 12/17/2013  . Smokeless tobacco: Never Used  . Alcohol Use: 0.0 oz/week    0 Standard drinks or equivalent per week     Comment: wine rare occ.  . Drug Use: Yes    Special: Marijuana     Comment: 1-2 x per week.01-19-15 None advised prior to surgery.  . Sexual Activity: Yes   Other Topics Concern  . Not on file   Social History Narrative     reports that he quit smoking about 18 months ago. He has never used smokeless tobacco. He reports that he drinks alcohol. He reports that he uses illicit drugs (Marijuana).  No Known Allergies  Medications: Patient's Medications  New Prescriptions   DIAZEPAM (VALIUM) 5 MG TABLET    Take 1 tablet (5 mg total) by mouth at bedtime.  Previous Medications   ASPIRIN EC 325 MG TABLET    Take 1 tablet (325 mg total) by mouth daily.   ATORVASTATIN (LIPITOR) 20 MG TABLET    Take 1 tablet (20 mg total) by mouth daily at 6 PM. For Cholesterol   BIMATOPROST (LUMIGAN) 0.03 % OPHTHALMIC DROPS    Place 1 drop into both eyes at bedtime.    CLOTRIMAZOLE-BETAMETHASONE (LOTRISONE) CREAM    Apply 1 application topically 2 (two) times daily.   CYCLOBENZAPRINE (FLEXERIL) 5 MG TABLET    Take 1 tablet (5 mg total) by mouth 3 (three) times daily as needed for muscle spasms.   DOCUSATE SODIUM (DSS) 100 MG CAPS    Take 100 mg by mouth 2 (two) times daily.   HYDROCHLOROTHIAZIDE (HYDRODIURIL) 50 MG TABLET    Take 1 tablet (50 mg total) by mouth daily with breakfast.    MELATONIN 5 MG CAPS    Take 1 capsule (5 mg total) by mouth at bedtime.   NALOXEGOL OXALATE (MOVANTIK) 25 MG TABS    Take 1 tablet (25 mg total) by mouth daily.   OXYCODONE (ROXICODONE) 15 MG IMMEDIATE RELEASE TABLET    Take 1 tablet (15 mg total) by mouth every 6 (six) hours as needed for pain.  Modified Medications   No medications on file  Discontinued Medications   No medications on file    Review of Systems  Constitutional: Positive for fatigue. Negative for chills and activity change.  HENT: Negative for sore throat and trouble swallowing.   Eyes: Negative for visual disturbance.  Respiratory: Negative for cough, chest tightness and shortness of breath.   Cardiovascular: Negative for chest pain, palpitations and leg swelling.  Gastrointestinal: Negative for nausea, vomiting, abdominal pain and blood in stool.  Genitourinary: Negative for urgency, frequency and difficulty urinating.  Musculoskeletal: Positive for back pain, arthralgias and gait problem.  Skin: Negative for rash.  Neurological: Positive for weakness (right hemiparesis). Negative for headaches.  Psychiatric/Behavioral: Positive for sleep disturbance. Negative for confusion. The patient is not nervous/anxious.     Filed Vitals:   07/13/15 0926  BP: 110/70  Pulse: 73  Temp: 97.7 F (36.5 C)  TempSrc: Oral  Resp: 20  Height: 5\' 11"  (1.803 m)  Weight: 157 lb (71.215 kg)  SpO2: 97%   Body mass index is 21.91 kg/(m^2).  Physical Exam  Constitutional: He is oriented to person, place, and time. He appears well-developed and well-nourished. No distress.  Looks tired in NAD  Musculoskeletal: He exhibits edema and tenderness.  Neurological: He is alert and oriented to person, place, and time. He exhibits abnormal muscle tone.  Right hemiparesis  Skin: Skin is warm and dry. No rash noted.  Psychiatric: His speech is normal. Thought content normal. His mood appears anxious. He is agitated.     Labs  reviewed: Office Visit on 06/03/2015  Component Date Value Ref Range Status  . TSH 06/03/2015 1.440  0.450 - 4.500 uIU/mL Final  . Free T4 06/03/2015 1.10  0.82 - 1.77 ng/dL Final  . T3, Total 16/09/9603 96  71 - 180 ng/dL Final  . Glucose 54/08/8118 98  65 - 99 mg/dL Final  . BUN 14/78/2956 14  6 - 24 mg/dL Final  . Creatinine, Ser 06/03/2015 1.02  0.76 - 1.27 mg/dL Final  . GFR calc non Af Amer 06/03/2015 81  >59  mL/min/1.73 Final  . GFR calc Af Amer 06/03/2015 93  >59 mL/min/1.73 Final  . BUN/Creatinine Ratio 06/03/2015 14  9 - 20 Final  . Sodium 06/03/2015 139  134 - 144 mmol/L Final  . Potassium 06/03/2015 4.1  3.5 - 5.2 mmol/L Final  . Chloride 06/03/2015 97  97 - 108 mmol/L Final  . CO2 06/03/2015 27  18 - 29 mmol/L Final  . Calcium 06/03/2015 9.8  8.7 - 10.2 mg/dL Final  . Total Protein 06/03/2015 8.2  6.0 - 8.5 g/dL Final  . Albumin 11/91/4782 4.4  3.5 - 5.5 g/dL Final  . Globulin, Total 06/03/2015 3.8  1.5 - 4.5 g/dL Final  . Albumin/Globulin Ratio 06/03/2015 1.2  1.1 - 2.5 Final  . Bilirubin Total 06/03/2015 0.4  0.0 - 1.2 mg/dL Final  . Alkaline Phosphatase 06/03/2015 83  39 - 117 IU/L Final  . AST 06/03/2015 31  0 - 40 IU/L Final  . ALT 06/03/2015 31  0 - 44 IU/L Final  . Cholesterol, Total 06/03/2015 180  100 - 199 mg/dL Final  . Triglycerides 06/03/2015 189* 0 - 149 mg/dL Final  . HDL 95/62/1308 42  >39 mg/dL Final   Comment: According to ATP-III Guidelines, HDL-C >59 mg/dL is considered a negative risk factor for CHD.   Marland Kitchen VLDL Cholesterol Cal 06/03/2015 38  5 - 40 mg/dL Final  . LDL Calculated 06/03/2015 657* 0 - 99 mg/dL Final  . Chol/HDL Ratio 06/03/2015 4.3  0.0 - 5.0 ratio units Final   Comment:                                   T. Chol/HDL Ratio                                             Men  Women                               1/2 Avg.Risk  3.4    3.3                                   Avg.Risk  5.0    4.4                                2X Avg.Risk  9.6     7.1                                3X Avg.Risk 23.4   11.0   . WBC 06/03/2015 9.0  3.4 - 10.8 x10E3/uL Final  . RBC 06/03/2015 4.83  4.14 - 5.80 x10E6/uL Final  . Hemoglobin 06/03/2015 14.7  12.6 - 17.7 g/dL Final  . Hematocrit 84/69/6295 42.6  37.5 - 51.0 % Final  . MCV 06/03/2015 88  79 - 97 fL Final  . MCH 06/03/2015 30.4  26.6 - 33.0 pg Final  . MCHC 06/03/2015 34.5  31.5 - 35.7 g/dL Final  . RDW 28/41/3244 14.2  12.3 - 15.4 % Final  . Platelets 06/03/2015 219  150 - 379  x10E3/uL Final  . Neutrophils 06/03/2015 21   Final  . Lymphs 06/03/2015 69   Final  . Monocytes 06/03/2015 8   Final  . Eos 06/03/2015 1   Final  . Basos 06/03/2015 1   Final  . Neutrophils Absolute 06/03/2015 1.9  1.4 - 7.0 x10E3/uL Final  . Lymphocytes Absolute 06/03/2015 6.2* 0.7 - 3.1 x10E3/uL Final  . Monocytes Absolute 06/03/2015 0.7  0.1 - 0.9 x10E3/uL Final  . EOS (ABSOLUTE) 06/03/2015 0.1  0.0 - 0.4 x10E3/uL Final  . Basophils Absolute 06/03/2015 0.1  0.0 - 0.2 x10E3/uL Final  . Immature Granulocytes 06/03/2015 0   Final  . Immature Grans (Abs) 06/03/2015 0.0  0.0 - 0.1 x10E3/uL Final    No results found.   Assessment/Plan   ICD-9-CM ICD-10-CM   1. Insomnia - uncontrolled 780.52 G47.00 diazepam (VALIUM) 5 MG tablet     DISCONTINUED: diazepam (VALIUM) 5 MG tablet  2. Frozen shoulder, right - unchanged 726.0 M75.01 diazepam (VALIUM) 5 MG tablet     DISCONTINUED: diazepam (VALIUM) 5 MG tablet  3. Hemiparesis affecting right side as late effect of cerebrovascular accident - stable 438.20 I69.851 diazepam (VALIUM) 5 MG tablet     DISCONTINUED: diazepam (VALIUM) 5 MG tablet  4. Primary osteoarthritis involving multiple joints - stable 715.09 M15.0 diazepam (VALIUM) 5 MG tablet     DISCONTINUED: diazepam (VALIUM) 5 MG tablet    --cont pain control with Oxy IR  --cont other meds as ordered  --f/u with Ortho as scheduled later this week  --keep appt in Sept as scheduled  Shelsea Hangartner S. Ancil Linsey  [facility] and [facility] [address] [place], [place] [address] [phone] Cell (Monday-Friday 8 AM - 5 PM) [phone] After 5 PM and follow prompts

## 2015-07-18 ENCOUNTER — Telehealth: Payer: Self-pay | Admitting: *Deleted

## 2015-07-18 NOTE — Telephone Encounter (Signed)
Patient called and stated that you told him to call. He says the Valium works great, he says he takes 2 of the  and it works.

## 2015-07-18 NOTE — Telephone Encounter (Signed)
Noted. He is only supposed to take 1 tab qhs not 2 tabs

## 2015-07-18 NOTE — Telephone Encounter (Signed)
Patient stated he will only take one tablet. Stated that they work good.

## 2015-07-29 ENCOUNTER — Other Ambulatory Visit: Payer: Self-pay

## 2015-07-29 ENCOUNTER — Other Ambulatory Visit: Payer: Self-pay | Admitting: *Deleted

## 2015-07-29 MED ORDER — OXYCODONE HCL 15 MG PO TABS: 15.0000 mg | ORAL_TABLET | Freq: Four times a day (QID) | ORAL | Status: DC | PRN

## 2015-07-29 MED ORDER — HYDROCHLOROTHIAZIDE 50 MG PO TABS: 50.0000 mg | ORAL_TABLET | Freq: Every day | ORAL | Status: DC

## 2015-07-29 NOTE — Telephone Encounter (Signed)
Last refill:06/29/15

## 2015-07-29 NOTE — Telephone Encounter (Signed)
[facility] [address] 

## 2015-08-25 ENCOUNTER — Other Ambulatory Visit: Payer: Self-pay | Admitting: *Deleted

## 2015-08-25 MED ORDER — HYDROCHLOROTHIAZIDE 50 MG PO TABS: 50.0000 mg | ORAL_TABLET | Freq: Every day | ORAL | Status: AC

## 2015-08-25 NOTE — Telephone Encounter (Signed)
Patient requested refill to be faxed to pharmacy. He only has 1 pill left.

## 2015-08-29 ENCOUNTER — Other Ambulatory Visit: Payer: Self-pay | Admitting: *Deleted

## 2015-08-29 MED ORDER — OXYCODONE HCL 15 MG PO TABS: 15.0000 mg | ORAL_TABLET | Freq: Four times a day (QID) | ORAL | Status: DC | PRN

## 2015-08-29 NOTE — Telephone Encounter (Signed)
Patient requested and will pick up 

## 2015-09-09 ENCOUNTER — Encounter: Payer: Self-pay | Admitting: Internal Medicine

## 2015-09-09 ENCOUNTER — Ambulatory Visit (INDEPENDENT_AMBULATORY_CARE_PROVIDER_SITE_OTHER): Payer: Medicare Other | Admitting: Internal Medicine

## 2015-09-09 VITALS — BP 110/80 | HR 78 | Temp 98.0°F | Resp 20 | Ht 71.0 in | Wt 157.0 lb

## 2015-09-09 DIAGNOSIS — I10 Essential (primary) hypertension: Secondary | ICD-10-CM

## 2015-09-09 DIAGNOSIS — E785 Hyperlipidemia, unspecified: Secondary | ICD-10-CM | POA: Diagnosis not present

## 2015-09-09 DIAGNOSIS — G4700 Insomnia, unspecified: Secondary | ICD-10-CM | POA: Diagnosis not present

## 2015-09-09 DIAGNOSIS — M150 Primary generalized (osteo)arthritis: Secondary | ICD-10-CM

## 2015-09-09 DIAGNOSIS — M159 Polyosteoarthritis, unspecified: Secondary | ICD-10-CM

## 2015-09-09 DIAGNOSIS — H409 Unspecified glaucoma: Secondary | ICD-10-CM

## 2015-09-09 DIAGNOSIS — M7501 Adhesive capsulitis of right shoulder: Secondary | ICD-10-CM | POA: Diagnosis not present

## 2015-09-09 DIAGNOSIS — I69351 Hemiplegia and hemiparesis following cerebral infarction affecting right dominant side: Secondary | ICD-10-CM

## 2015-09-09 DIAGNOSIS — I69851 Hemiplegia and hemiparesis following other cerebrovascular disease affecting right dominant side: Secondary | ICD-10-CM

## 2015-09-09 MED ORDER — DIAZEPAM 5 MG PO TABS: 5.0000 mg | ORAL_TABLET | Freq: Every day | ORAL | Status: DC

## 2015-09-09 NOTE — Progress Notes (Signed)
Patient ID: Nathan Buchanan, male   DOB: Apr 09, 1956, 59 y.o.   MRN: [id number]    Location:    [facility]   Place of Service:  OFFICE   Chief Complaint  Patient presents with  . Medical Management of Chronic Issues    chest cold , wants Outpatient PT    HPI:  59 yo male seen today for f/u. He started taking nyquil about 2 days ago for URI and is feeling better today.   HTN - BP stable on HCTZ. No muscle cramps. No palpitations.  Arthritis - pain is stable on roxicodone. He has been taking 2 valiums that "knocks me out" at night.  Hyperlipidemia - stable on statin  Bipolar - stable on movantik. No SI/HI  CVA - he has right hemiparesis but would like to take PT to improve ROM. He takes ASA daily  Hep C - stable liver enzymes.  Vision stable. He is out of lumigan eye gtts. He has not seen eye [facility] recently due to expense of copays  Past Medical History  Diagnosis Date  . Hypertension   . Arthritis   . Back ache   . Urinary frequency   . Generalized osteoarthrosis, involving multiple sites   . Dermatophytosis of the body   . Unspecified glaucoma   . Cirrhosis of liver without mention of alcohol   . Osteoarthrosis, unspecified whether generalized or localized, unspecified site   . Hyperlipidemia   . Stroke      12'15 -Right sided weakness remains  . Bipolar I disorder, most recent episode (or current) unspecified     no meds at present  . Chronic hepatitis C without mention of hepatic coma     tx. with meds/ was told" was cured"    Past Surgical History  Procedure Laterality Date  . Penile prosthesis implant  04/12/2010    Dr.Grapey, Insertion of 3 peice penile prosthesis   . Anal fissure repair  2008  . Abcess drainage      Gluteal MRSA drainage   . Colonoscopy  2008    Dr.Edwards, Internal Hemorrhoids   . Cystoscopy N/A 01/24/2015    Procedure: CYSTOSCOPY FLEXIBLE;  Surgeon: Valetta Fuller, MD;  Location: [facility];  Service: Urology;  Laterality: N/A;  . Penile  prosthesis implant N/A 01/24/2015    Procedure: EXPLANTATION OF PENILE PROSTHESIS;  Surgeon: Valetta Fuller, MD;  Location: [facility];  Service: Urology;  Laterality: N/A;    Patient Care Team: Kirt Boys, DO as PCP - General (Internal Medicine) Marvel Plan, MD as Consulting Physician (Neurology)  Social History   Social History  . Marital Status: Single    Spouse Name: N/A  . Number of Children: N/A  . Years of Education: N/A   Occupational History  . Not on file.   Social History Main Topics  . Smoking status: Former Smoker -- 5 years    Quit date: 12/17/2013  . Smokeless tobacco: Never Used  . Alcohol Use: 0.0 oz/week    0 Standard drinks or equivalent per week     Comment: wine rare occ.  . Drug Use: Yes    Special: Marijuana     Comment: 1-2 x per week.01-19-15 None advised prior to surgery.  . Sexual Activity: Yes   Other Topics Concern  . Not on file   Social History Narrative     reports that he quit smoking about 20 months ago. He has never used smokeless tobacco. He reports that he drinks alcohol.  He reports that he uses illicit drugs (Marijuana).  No Known Allergies  Medications: Patient's Medications  New Prescriptions   No medications on file  Previous Medications   ASPIRIN EC 325 MG TABLET    Take 1 tablet (325 mg total) by mouth daily.   ATORVASTATIN (LIPITOR) 20 MG TABLET    Take 1 tablet (20 mg total) by mouth daily at 6 PM. For Cholesterol   BIMATOPROST (LUMIGAN) 0.03 % OPHTHALMIC DROPS    Place 1 drop into both eyes at bedtime.    CLOTRIMAZOLE-BETAMETHASONE (LOTRISONE) CREAM    Apply 1 application topically 2 (two) times daily.   CYCLOBENZAPRINE (FLEXERIL) 5 MG TABLET    Take 1 tablet (5 mg total) by mouth 3 (three) times daily as needed for muscle spasms.   DIAZEPAM (VALIUM) 5 MG TABLET    Take 1 tablet (5 mg total) by mouth at bedtime.   DOCUSATE SODIUM (DSS) 100 MG CAPS    Take 100 mg by mouth 2 (two) times daily.   HYDROCHLOROTHIAZIDE  (HYDRODIURIL) 50 MG TABLET    Take 1 tablet (50 mg total) by mouth daily with breakfast.   MELATONIN 5 MG CAPS    Take 1 capsule (5 mg total) by mouth at bedtime.   NALOXEGOL OXALATE (MOVANTIK) 25 MG TABS    Take 1 tablet (25 mg total) by mouth daily.   OXYCODONE (ROXICODONE) 15 MG IMMEDIATE RELEASE TABLET    Take 1 tablet (15 mg total) by mouth every 6 (six) hours as needed for pain.  Modified Medications   No medications on file  Discontinued Medications   No medications on file    Review of Systems  Constitutional: Negative for chills, activity change and fatigue.  HENT: Negative for sore throat and trouble swallowing.   Eyes: Negative for visual disturbance.  Respiratory: Negative for cough, chest tightness and shortness of breath.   Cardiovascular: Negative for chest pain, palpitations and leg swelling.  Gastrointestinal: Negative for nausea, vomiting, abdominal pain and blood in stool.  Genitourinary: Negative for urgency, frequency and difficulty urinating.  Musculoskeletal: Positive for joint swelling, arthralgias and gait problem.  Skin: Negative for rash.  Neurological: Positive for weakness. Negative for headaches.  Psychiatric/Behavioral: Positive for sleep disturbance. Negative for confusion. The patient is not nervous/anxious.     Filed Vitals:   09/09/15 1051  BP: 110/80  Pulse: 78  Temp: 98 F (36.7 C)  TempSrc: Oral  Resp: 20  Height: 5\' 11"  (1.803 m)  Weight: 157 lb (71.215 kg)  SpO2: 98%   Body mass index is 21.91 kg/(m^2).  Physical Exam  Constitutional: He is oriented to person, place, and time. He appears well-developed and well-nourished. No distress.  HENT:  Mouth/Throat: Oropharynx is clear and moist.  Eyes: Pupils are equal, round, and reactive to light. No scleral icterus.  Neck: Neck supple. Carotid bruit is not present. No thyromegaly present.  Cardiovascular: Normal rate, regular rhythm, normal heart sounds and intact distal pulses.  Exam  reveals no gallop and no friction rub.   No murmur heard. no distal LE swelling. No calf TTP  Pulmonary/Chest: Effort normal and breath sounds normal. He has no wheezes. He has no rales. He exhibits no tenderness.  Abdominal: Soft. Bowel sounds are normal. He exhibits no distension, no abdominal bruit, no pulsatile midline mass and no mass. There is no tenderness. There is no rebound and no guarding.  Musculoskeletal: He exhibits edema and tenderness.  Unsteady gait  Lymphadenopathy:    He  has no cervical adenopathy.  Neurological: He is alert and oriented to person, place, and time. He displays atrophy. He exhibits abnormal muscle tone. Gait abnormal.  Right UE strength 3/5 and 4/5 in LE  Skin: Skin is warm and dry. No rash noted.  Psychiatric: He has a normal mood and affect. His behavior is normal. Judgment and thought content normal.     Labs reviewed: No visits with results within 3 Month(s) from this visit. Latest known visit with results is:  Office Visit on 06/03/2015  Component Date Value Ref Range Status  . TSH 06/03/2015 1.440  0.450 - 4.500 uIU/mL Final  . Free T4 06/03/2015 1.10  0.82 - 1.77 ng/dL Final  . T3, Total 16/09/9603 96  71 - 180 ng/dL Final  . Glucose 54/08/8118 98  65 - 99 mg/dL Final  . BUN 14/78/2956 14  6 - 24 mg/dL Final  . Creatinine, Ser 06/03/2015 1.02  0.76 - 1.27 mg/dL Final  . GFR calc non Af Amer 06/03/2015 81  >59 mL/min/1.73 Final  . GFR calc Af Amer 06/03/2015 93  >59 mL/min/1.73 Final  . BUN/Creatinine Ratio 06/03/2015 14  9 - 20 Final  . Sodium 06/03/2015 139  134 - 144 mmol/L Final  . Potassium 06/03/2015 4.1  3.5 - 5.2 mmol/L Final  . Chloride 06/03/2015 97  97 - 108 mmol/L Final  . CO2 06/03/2015 27  18 - 29 mmol/L Final  . Calcium 06/03/2015 9.8  8.7 - 10.2 mg/dL Final  . Total Protein 06/03/2015 8.2  6.0 - 8.5 g/dL Final  . Albumin 21/30/8657 4.4  3.5 - 5.5 g/dL Final  . Globulin, Total 06/03/2015 3.8  1.5 - 4.5 g/dL Final  .  Albumin/Globulin Ratio 06/03/2015 1.2  1.1 - 2.5 Final  . Bilirubin Total 06/03/2015 0.4  0.0 - 1.2 mg/dL Final  . Alkaline Phosphatase 06/03/2015 83  39 - 117 IU/L Final  . AST 06/03/2015 31  0 - 40 IU/L Final  . ALT 06/03/2015 31  0 - 44 IU/L Final  . Cholesterol, Total 06/03/2015 180  100 - 199 mg/dL Final  . Triglycerides 06/03/2015 189* 0 - 149 mg/dL Final  . HDL 84/69/6295 42  >39 mg/dL Final   Comment: According to ATP-III Guidelines, HDL-C >59 mg/dL is considered a negative risk factor for CHD.   Marland Kitchen VLDL Cholesterol Cal 06/03/2015 38  5 - 40 mg/dL Final  . LDL Calculated 06/03/2015 284* 0 - 99 mg/dL Final  . Chol/HDL Ratio 06/03/2015 4.3  0.0 - 5.0 ratio units Final   Comment:                                   T. Chol/HDL Ratio                                             Men  Women                               1/2 Avg.Risk  3.4    3.3                                   Avg.Risk  5.0  4.4                                2X Avg.Risk  9.6    7.1                                3X Avg.Risk 23.4   11.0   . WBC 06/03/2015 9.0  3.4 - 10.8 x10E3/uL Final  . RBC 06/03/2015 4.83  4.14 - 5.80 x10E6/uL Final  . Hemoglobin 06/03/2015 14.7  12.6 - 17.7 g/dL Final  . Hematocrit 40/98/1191 42.6  37.5 - 51.0 % Final  . MCV 06/03/2015 88  79 - 97 fL Final  . MCH 06/03/2015 30.4  26.6 - 33.0 pg Final  . MCHC 06/03/2015 34.5  31.5 - 35.7 g/dL Final  . RDW 47/82/9562 14.2  12.3 - 15.4 % Final  . Platelets 06/03/2015 219  150 - 379 x10E3/uL Final  . Neutrophils 06/03/2015 21   Final  . Lymphs 06/03/2015 69   Final  . Monocytes 06/03/2015 8   Final  . Eos 06/03/2015 1   Final  . Basos 06/03/2015 1   Final  . Neutrophils Absolute 06/03/2015 1.9  1.4 - 7.0 x10E3/uL Final  . Lymphocytes Absolute 06/03/2015 6.2* 0.7 - 3.1 x10E3/uL Final  . Monocytes Absolute 06/03/2015 0.7  0.1 - 0.9 x10E3/uL Final  . EOS (ABSOLUTE) 06/03/2015 0.1  0.0 - 0.4 x10E3/uL Final  . Basophils Absolute 06/03/2015 0.1  0.0  - 0.2 x10E3/uL Final  . Immature Granulocytes 06/03/2015 0   Final  . Immature Grans (Abs) 06/03/2015 0.0  0.0 - 0.1 x10E3/uL Final    No results found.   Assessment/Plan   ICD-9-CM ICD-10-CM   1. Hemiparesis affecting right side as late effect of cerebrovascular accident 438.20 I69.851 Ambulatory referral to Physical Therapy     diazepam (VALIUM) 5 MG tablet     CMP     Lipid Panel  2. Essential hypertension - stable 401.9 I10 CMP     Lipid Panel  3. Hyperlipidemia - stable 272.4 E78.5 CMP     Lipid Panel  4. Glaucoma 365.9 H40.9   5. Primary osteoarthritis involving multiple joints - stable 715.09 M15.0 diazepam (VALIUM) 5 MG tablet  6. Insomnia - stable 780.52 G47.00 diazepam (VALIUM) 5 MG tablet  7. Frozen shoulder, right - improving 726.0 M75.01 Ambulatory referral to Physical Therapy     diazepam (VALIUM) 5 MG tablet    Continue current medications as ordered  Push fluids and rest. Continue OTC nyquil  Will call with lab results  Follow up in 3 mos for routine visit  Needs to f/u with eye specialist  Monica S. Ancil Linsey  [facility] and Adult Medicine [address] [place], [place] [address] [phone] Cell (Monday-Friday 8 AM - 5 PM) [phone] After 5 PM and follow prompts

## 2015-09-09 NOTE — Patient Instructions (Signed)
Continue current medications as ordered  Push fluids and rest. Continue OTC nyquil  Will call with lab results  Follow up in 3 mos for routine visit

## 2015-09-10 LAB — COMPREHENSIVE METABOLIC PANEL
ALK PHOS: 81 IU/L (ref 39–117)
ALT: 33 IU/L (ref 0–44)
AST: 32 IU/L (ref 0–40)
Albumin/Globulin Ratio: 1.2 (ref 1.1–2.5)
Albumin: 4.3 g/dL (ref 3.5–5.5)
BILIRUBIN TOTAL: 0.5 mg/dL (ref 0.0–1.2)
BUN/Creatinine Ratio: 9 (ref 9–20)
BUN: 9 mg/dL (ref 6–24)
CHLORIDE: 97 mmol/L (ref 97–108)
CO2: 28 mmol/L (ref 18–29)
Calcium: 9.5 mg/dL (ref 8.7–10.2)
Creatinine, Ser: 1.02 mg/dL (ref 0.76–1.27)
GFR calc non Af Amer: 81 mL/min/{1.73_m2} (ref >59)
GFR, EST AFRICAN AMERICAN: 93 mL/min/{1.73_m2} (ref >59)
GLUCOSE: 83 mg/dL (ref 65–99)
Globulin, Total: 3.7 g/dL (ref 1.5–4.5)
POTASSIUM: 4.2 mmol/L (ref 3.5–5.2)
Sodium: 138 mmol/L (ref 134–144)
Total Protein: 8 g/dL (ref 6.0–8.5)

## 2015-09-10 LAB — LIPID PANEL
CHOLESTEROL TOTAL: 128 mg/dL (ref 100–199)
Chol/HDL Ratio: 2.8 ratio units (ref 0.0–5.0)
HDL: 46 mg/dL (ref >39)
LDL Calculated: 53 mg/dL (ref 0–99)
Triglycerides: 145 mg/dL (ref 0–149)
VLDL Cholesterol Cal: 29 mg/dL (ref 5–40)

## 2015-09-28 ENCOUNTER — Other Ambulatory Visit: Payer: Self-pay

## 2015-09-28 MED ORDER — OXYCODONE HCL 15 MG PO TABS: 15.0000 mg | ORAL_TABLET | Freq: Four times a day (QID) | ORAL | Status: DC | PRN

## 2015-09-29 ENCOUNTER — Ambulatory Visit: Payer: Medicare Other | Admitting: Physical Therapy

## 2015-10-04 ENCOUNTER — Ambulatory Visit: Payer: Medicare Other | Attending: Internal Medicine | Admitting: Physical Therapy

## 2015-10-28 ENCOUNTER — Other Ambulatory Visit: Payer: Self-pay | Admitting: *Deleted

## 2015-10-28 MED ORDER — OXYCODONE HCL 15 MG PO TABS: 15.0000 mg | ORAL_TABLET | Freq: Four times a day (QID) | ORAL | Status: DC | PRN

## 2015-10-28 NOTE — Telephone Encounter (Signed)
Patient requested and will pick up. Narcotic contract printed. 

## 2015-11-12 DIAGNOSIS — R202 Paresthesia of skin: Secondary | ICD-10-CM | POA: Diagnosis not present

## 2015-11-12 DIAGNOSIS — M79652 Pain in left thigh: Secondary | ICD-10-CM | POA: Diagnosis not present

## 2015-11-12 DIAGNOSIS — M545 Low back pain: Secondary | ICD-10-CM | POA: Diagnosis not present

## 2015-11-12 DIAGNOSIS — G8929 Other chronic pain: Secondary | ICD-10-CM | POA: Diagnosis not present

## 2015-11-12 DIAGNOSIS — M47816 Spondylosis without myelopathy or radiculopathy, lumbar region: Secondary | ICD-10-CM | POA: Diagnosis not present

## 2015-11-12 DIAGNOSIS — S79922A Unspecified injury of left thigh, initial encounter: Secondary | ICD-10-CM | POA: Diagnosis not present

## 2015-11-23 ENCOUNTER — Other Ambulatory Visit: Payer: Self-pay

## 2015-11-23 MED ORDER — OXYCODONE HCL 15 MG PO TABS: 15.0000 mg | ORAL_TABLET | Freq: Four times a day (QID) | ORAL | Status: DC | PRN

## 2015-11-24 LAB — CBC AND DIFFERENTIAL
HCT: 42 % (ref 41–53)
Hemoglobin: 14.8 g/dL (ref 13.5–17.5)
PLATELETS: 237 10*3/uL (ref 150–399)
WBC: 12.4 10*3/mL

## 2015-12-09 ENCOUNTER — Encounter: Payer: Self-pay | Admitting: Internal Medicine

## 2015-12-09 ENCOUNTER — Ambulatory Visit (INDEPENDENT_AMBULATORY_CARE_PROVIDER_SITE_OTHER): Payer: Medicare Other | Admitting: Internal Medicine

## 2015-12-09 VITALS — BP 120/82 | HR 86 | Temp 98.3°F | Resp 18 | Ht 71.0 in | Wt 158.6 lb

## 2015-12-09 DIAGNOSIS — M159 Polyosteoarthritis, unspecified: Secondary | ICD-10-CM

## 2015-12-09 DIAGNOSIS — E785 Hyperlipidemia, unspecified: Secondary | ICD-10-CM | POA: Diagnosis not present

## 2015-12-09 DIAGNOSIS — I69351 Hemiplegia and hemiparesis following cerebral infarction affecting right dominant side: Secondary | ICD-10-CM | POA: Diagnosis not present

## 2015-12-09 DIAGNOSIS — D72829 Elevated white blood cell count, unspecified: Secondary | ICD-10-CM | POA: Diagnosis not present

## 2015-12-09 DIAGNOSIS — I10 Essential (primary) hypertension: Secondary | ICD-10-CM

## 2015-12-09 DIAGNOSIS — R7989 Other specified abnormal findings of blood chemistry: Secondary | ICD-10-CM

## 2015-12-09 DIAGNOSIS — G4700 Insomnia, unspecified: Secondary | ICD-10-CM | POA: Diagnosis not present

## 2015-12-09 DIAGNOSIS — M150 Primary generalized (osteo)arthritis: Secondary | ICD-10-CM | POA: Diagnosis not present

## 2015-12-09 DIAGNOSIS — H409 Unspecified glaucoma: Secondary | ICD-10-CM

## 2015-12-09 MED ORDER — BIMATOPROST 0.03 % OP SOLN: 1.0000 [drp] | Freq: Every day | OPHTHALMIC | Status: AC

## 2015-12-09 NOTE — Patient Instructions (Signed)
Will call with referral to blood specialist (Hematology)  Continue current medications as ordered  [facility]  Keep appt with specialists as scheduled  Follow up in 3 mos for routine visit

## 2015-12-09 NOTE — Progress Notes (Signed)
Patient ID: Nathan Buchanan, male   DOB: [birth year], 59 y.o.   MRN: [id number]    Location:    [facility]   Place of Service:  OFFICE   Chief Complaint  Patient presents with  . Medical Management of Chronic Issues    3 month follow-up for Hypertension, Hyperlipidemia    HPI:  59 yo male seen today for f/u. He was seen at the [place] on 12/8th and CBC revealed smudge cells with a few atypical lymphocytes, WBC 12.45K, lymphs 75.4% with 9.39K abs lymphs, 1.96K abs neutrophils. He is c/a leukemia but states "I feel fine". He reports no easy bruising. No personal or FHx leukemia  HTN - BP stable on HCTZ. No muscle cramps. No palpitations.  Arthritis - pain is stable on roxicodone. He has been taking 2 valiums that "knocks me out" at night.  Hyperlipidemia - stable on statin  Bipolar - stable on movantik. No SI/HI  CVA - he has right hemiparesis but would like to take PT to improve ROM. Plans to begin PT in Feb 2017. He takes ASA daily  Hep C - stable liver enzymes.  Vision stable. He is out of lumigan eye gtts. He has not seen eye provider recently due to expense of copays  Past Medical History  Diagnosis Date  . Hypertension   . Arthritis   . Back ache   . Urinary frequency   . Generalized osteoarthrosis, involving multiple sites   . Dermatophytosis of the body   . Unspecified glaucoma   . Cirrhosis of liver without mention of alcohol   . Osteoarthrosis, unspecified whether generalized or localized, unspecified site   . Hyperlipidemia   . Stroke      12'15 -Right sided weakness remains  . Bipolar I disorder, most recent episode (or current) unspecified     no meds at present  . Chronic hepatitis C without mention of hepatic coma     tx. with meds/ was told" was cured"    Past Surgical History  Procedure Laterality Date  . Penile prosthesis implant  04/12/2010    Dr.Grapey, Insertion of 3 peice penile prosthesis   . Anal fissure repair  2008  . Abcess drainage      Gluteal MRSA  drainage   . Colonoscopy  2008    Dr.Edwards, Internal Hemorrhoids   . Cystoscopy N/A 01/24/2015    Procedure: CYSTOSCOPY FLEXIBLE;  Surgeon: Valetta Fuller, MD;  Location: [facility];  Service: Urology;  Laterality: N/A;  . Penile prosthesis implant N/A 01/24/2015    Procedure: EXPLANTATION OF PENILE PROSTHESIS;  Surgeon: Valetta Fuller, MD;  Location: [facility];  Service: Urology;  Laterality: N/A;    Patient Care Team: Kirt Boys, DO as PCP - General (Internal Medicine) Marvel Plan, MD as Consulting Physician (Neurology)  Social History   Social History  . Marital Status: Single    Spouse Name: N/A  . Number of Children: N/A  . Years of Education: N/A   Occupational History  . Not on file.   Social History Main Topics  . Smoking status: Former Smoker -- 5 years    Quit date: 12/17/2013  . Smokeless tobacco: Never Used  . Alcohol Use: 0.0 oz/week    0 Standard drinks or equivalent per week     Comment: wine rare occ.  . Drug Use: Yes    Special: Marijuana     Comment: 1-2 x per week.01-19-15 None advised prior to surgery.  . Sexual Activity: Yes  Other Topics Concern  . Not on file   Social History Narrative     reports that he quit smoking about 1 years ago. He has never used smokeless tobacco. He reports that he drinks alcohol. He reports that he uses illicit drugs (Marijuana).  No Known Allergies  Medications: Patient's Medications  New Prescriptions   No medications on file  Previous Medications   ASPIRIN EC 325 MG TABLET    Take 1 tablet (325 mg total) by mouth daily.   ATORVASTATIN (LIPITOR) 20 MG TABLET    Take 1 tablet (20 mg total) by mouth daily at 6 PM. For Cholesterol   BIMATOPROST (LUMIGAN) 0.03 % OPHTHALMIC DROPS    Place 1 drop into both eyes at bedtime.    CLOTRIMAZOLE-BETAMETHASONE (LOTRISONE) CREAM    Apply 1 application topically 2 (two) times daily.   CYCLOBENZAPRINE (FLEXERIL) 5 MG TABLET    Take 1 tablet (5 mg total) by mouth 3 (three) times daily  as needed for muscle spasms.   DIAZEPAM (VALIUM) 5 MG TABLET    Take 1 tablet (5 mg total) by mouth at bedtime.   DOCUSATE SODIUM (DSS) 100 MG CAPS    Take 100 mg by mouth 2 (two) times daily.   HYDROCHLOROTHIAZIDE (HYDRODIURIL) 50 MG TABLET    Take 1 tablet (50 mg total) by mouth daily with breakfast.   MELATONIN 5 MG CAPS    Take 1 capsule (5 mg total) by mouth at bedtime.   NALOXEGOL OXALATE (MOVANTIK) 25 MG TABS    Take 1 tablet (25 mg total) by mouth daily.   OXYCODONE (ROXICODONE) 15 MG IMMEDIATE RELEASE TABLET    Take 1 tablet (15 mg total) by mouth every 6 (six) hours as needed for pain.  Modified Medications   No medications on file  Discontinued Medications   No medications on file    Review of Systems  Unable to perform ROS: Psychiatric disorder    There were no vitals filed for this visit. There is no weight on file to calculate BMI.  Physical Exam  Constitutional: He is oriented to person, place, and time. He appears well-developed and well-nourished. No distress.  HENT:  Mouth/Throat: Oropharynx is clear and moist.  Eyes: Pupils are equal, round, and reactive to light. No scleral icterus.  Neck: Neck supple. Carotid bruit is not present. No thyromegaly present.  Cardiovascular: Normal rate, regular rhythm, normal heart sounds and intact distal pulses.  Exam reveals no gallop and no friction rub.   No murmur heard. no distal LE swelling. No calf TTP  Pulmonary/Chest: Effort normal and breath sounds normal. He has no wheezes. He has no rales. He exhibits no tenderness.  Abdominal: Soft. Bowel sounds are normal. He exhibits no distension, no abdominal bruit, no pulsatile midline mass and no mass. There is no splenomegaly or hepatomegaly. There is no tenderness. There is no rebound and no guarding.  Musculoskeletal:  Right hemiparesis. Gait unsteady  Lymphadenopathy:    He has no cervical adenopathy.  Neurological: He is alert and oriented to person, place, and time.    Skin: Skin is warm and dry. No rash noted.  Psychiatric: He has a normal mood and affect. His behavior is normal. Thought content normal.     Labs reviewed: No visits with results within 3 Month(s) from this visit. Latest known visit with results is:  Office Visit on 09/09/2015  Component Date Value Ref Range Status  . Glucose 09/09/2015 83  65 - 99 mg/dL Final  .  BUN 09/09/2015 9  6 - 24 mg/dL Final  . Creatinine, Ser 09/09/2015 1.02  0.76 - 1.27 mg/dL Final  . GFR calc non Af Amer 09/09/2015 81  >59 mL/min/1.73 Final  . GFR calc Af Amer 09/09/2015 93  >59 mL/min/1.73 Final  . BUN/Creatinine Ratio 09/09/2015 9  9 - 20 Final  . Sodium 09/09/2015 138  134 - 144 mmol/L Final  . Potassium 09/09/2015 4.2  3.5 - 5.2 mmol/L Final  . Chloride 09/09/2015 97  97 - 108 mmol/L Final  . CO2 09/09/2015 28  18 - 29 mmol/L Final  . Calcium 09/09/2015 9.5  8.7 - 10.2 mg/dL Final  . Total Protein 09/09/2015 8.0  6.0 - 8.5 g/dL Final  . Albumin 16/10/960409/23/2016 4.3  3.5 - 5.5 g/dL Final  . Globulin, Total 09/09/2015 3.7  1.5 - 4.5 g/dL Final  . Albumin/Globulin Ratio 09/09/2015 1.2  1.1 - 2.5 Final  . Bilirubin Total 09/09/2015 0.5  0.0 - 1.2 mg/dL Final  . Alkaline Phosphatase 09/09/2015 81  39 - 117 IU/L Final  . AST 09/09/2015 32  0 - 40 IU/L Final  . ALT 09/09/2015 33  0 - 44 IU/L Final  . Cholesterol, Total 09/09/2015 128  100 - 199 mg/dL Final  . Triglycerides 09/09/2015 145  0 - 149 mg/dL Final  . HDL 54/09/811909/23/2016 46  >39 mg/dL Final   Comment: According to ATP-III Guidelines, HDL-C >59 mg/dL is considered a negative risk factor for CHD.   Marland Kitchen. VLDL Cholesterol Cal 09/09/2015 29  5 - 40 mg/dL Final  . LDL Calculated 09/09/2015 53  0 - 99 mg/dL Final  . Chol/HDL Ratio 09/09/2015 2.8  0.0 - 5.0 ratio units Final   Comment:                                   T. Chol/HDL Ratio                                             Men  Women                               1/2 Avg.Risk  3.4    3.3                                    Avg.Risk  5.0    4.4                                2X Avg.Risk  9.6    7.1                                3X Avg.Risk 23.4   11.0     No results found.   Assessment/Plan   ICD-9-CM ICD-10-CM   1. Abnormal CBC with smudge cells and atypical lymphs 790.6 R79.89 Ambulatory referral to Hematology     CBC with Differential  2. Leukocytosis 288.60 D72.829 Ambulatory referral to Hematology     CBC with Differential  3. Essential hypertension 401.9 I10   4. Hemiparesis  affecting right side as late effect of cerebrovascular accident (HCC) 438.20 I69.351   5. Hyperlipidemia 272.4 E78.5   6. Glaucoma 365.9 H40.9   7. Primary osteoarthritis involving multiple joints 715.09 M15.0   8. Insomnia 780.52 G47.00    Continue current medications as ordered  [facility]  Keep appt with specialists as scheduled  Follow up in 3 mos for routine visit  Dabria Wadas S. Ancil Linsey  [facility] [address] [place], [place] [address] [phone] Cell (Monday-Friday 8 AM - 5 PM) [phone] After 5 PM and follow prompts

## 2015-12-10 LAB — CBC WITH DIFFERENTIAL/PLATELET
BASOS ABS: 0.1 10*3/uL (ref 0.0–0.2)
Basos: 1 %
EOS (ABSOLUTE): 0.1 10*3/uL (ref 0.0–0.4)
Eos: 1 %
HEMOGLOBIN: 14 g/dL (ref 12.6–17.7)
Hematocrit: 40.9 % (ref 37.5–51.0)
IMMATURE GRANS (ABS): 0 10*3/uL (ref 0.0–0.1)
IMMATURE GRANULOCYTES: 0 %
LYMPHS ABS: 7 10*3/uL — AB (ref 0.7–3.1)
LYMPHS: 73 %
MCH: 30.2 pg (ref 26.6–33.0)
MCHC: 34.2 g/dL (ref 31.5–35.7)
MCV: 88 fL (ref 79–97)
MONOCYTES: 7 %
Monocytes Absolute: 0.7 10*3/uL (ref 0.1–0.9)
NEUTROS PCT: 18 %
Neutrophils Absolute: 1.8 10*3/uL (ref 1.4–7.0)
Platelets: 193 10*3/uL (ref 150–379)
RBC: 4.63 x10E6/uL (ref 4.14–5.80)
RDW: 14.2 % (ref 12.3–15.4)
WBC: 9.6 10*3/uL (ref 3.4–10.8)

## 2015-12-21 ENCOUNTER — Other Ambulatory Visit: Payer: Self-pay | Admitting: Internal Medicine

## 2015-12-27 ENCOUNTER — Other Ambulatory Visit: Payer: Self-pay | Admitting: *Deleted

## 2015-12-27 MED ORDER — OXYCODONE HCL 15 MG PO TABS: 15.0000 mg | ORAL_TABLET | Freq: Four times a day (QID) | ORAL | Status: DC | PRN

## 2015-12-27 NOTE — Telephone Encounter (Signed)
Patient walked in requesting Rx. Printed

## 2016-01-26 ENCOUNTER — Other Ambulatory Visit: Payer: Self-pay | Admitting: *Deleted

## 2016-01-26 MED ORDER — OXYCODONE HCL 15 MG PO TABS: 15.0000 mg | ORAL_TABLET | Freq: Four times a day (QID) | ORAL | Status: DC | PRN

## 2016-01-30 ENCOUNTER — Other Ambulatory Visit: Payer: Self-pay | Admitting: *Deleted

## 2016-01-30 MED ORDER — DIAZEPAM 5 MG PO TABS: 5.0000 mg | ORAL_TABLET | Freq: Every day | ORAL | Status: DC

## 2016-01-30 NOTE — Telephone Encounter (Signed)
[facility] [address]

## 2016-02-16 ENCOUNTER — Encounter: Payer: Self-pay | Admitting: Internal Medicine

## 2016-02-24 ENCOUNTER — Encounter: Payer: Self-pay | Admitting: Internal Medicine

## 2016-02-24 ENCOUNTER — Other Ambulatory Visit: Payer: Self-pay

## 2016-02-24 MED ORDER — OXYCODONE HCL 15 MG PO TABS: 15.0000 mg | ORAL_TABLET | Freq: Four times a day (QID) | ORAL | Status: DC | PRN

## 2016-03-09 ENCOUNTER — Ambulatory Visit: Payer: Medicare Other | Admitting: Internal Medicine

## 2016-03-26 ENCOUNTER — Other Ambulatory Visit: Payer: Self-pay | Admitting: *Deleted

## 2016-03-26 MED ORDER — OXYCODONE HCL 15 MG PO TABS: 15.0000 mg | ORAL_TABLET | Freq: Four times a day (QID) | ORAL | Status: DC | PRN

## 2016-04-11 ENCOUNTER — Encounter: Payer: Self-pay | Admitting: Internal Medicine

## 2016-04-11 ENCOUNTER — Ambulatory Visit (INDEPENDENT_AMBULATORY_CARE_PROVIDER_SITE_OTHER): Payer: Medicare Other | Admitting: Internal Medicine

## 2016-04-11 VITALS — BP 108/78 | HR 74 | Temp 98.3°F | Resp 20 | Ht 71.0 in | Wt 158.2 lb

## 2016-04-11 DIAGNOSIS — E785 Hyperlipidemia, unspecified: Secondary | ICD-10-CM | POA: Diagnosis not present

## 2016-04-11 DIAGNOSIS — G4700 Insomnia, unspecified: Secondary | ICD-10-CM

## 2016-04-11 DIAGNOSIS — M150 Primary generalized (osteo)arthritis: Secondary | ICD-10-CM | POA: Diagnosis not present

## 2016-04-11 DIAGNOSIS — I10 Essential (primary) hypertension: Secondary | ICD-10-CM

## 2016-04-11 DIAGNOSIS — I69351 Hemiplegia and hemiparesis following cerebral infarction affecting right dominant side: Secondary | ICD-10-CM | POA: Diagnosis not present

## 2016-04-11 DIAGNOSIS — L309 Dermatitis, unspecified: Secondary | ICD-10-CM

## 2016-04-11 DIAGNOSIS — D72829 Elevated white blood cell count, unspecified: Secondary | ICD-10-CM

## 2016-04-11 DIAGNOSIS — M159 Polyosteoarthritis, unspecified: Secondary | ICD-10-CM

## 2016-04-11 MED ORDER — DIAZEPAM 5 MG PO TABS: 5.0000 mg | ORAL_TABLET | Freq: Every day | ORAL | Status: DC

## 2016-04-11 MED ORDER — CLOTRIMAZOLE-BETAMETHASONE 1-0.05 % EX CREA: 1.0000 "application " | TOPICAL_CREAM | Freq: Two times a day (BID) | CUTANEOUS | Status: DC

## 2016-04-11 NOTE — Patient Instructions (Signed)
Continue current medications as ordered  Follow up in 4 mos for routine visit. [facility] with [facility]

## 2016-04-11 NOTE — Progress Notes (Signed)
Patient ID: Nathan Buchanan, male   DOB: 10-11-1956, 60 y.o.   MRN: [id number]    Location:    [facility]   Place of Service:   OFFICE  Chief Complaint  Patient presents with  . Medical Management of Chronic Issues    3 month follow-up for routine visit     HPI:  60 yo male seen today for f/u. He was provided a new power scooter by [place] along with lift for truck. Needs RF for cream for rash on buttocks.  HTN - BP stable on HCTZ. No muscle cramps. No palpitations.  Arthritis - pain is stable on roxicodone. He has been taking 2 valiums that "knocks me out" at night.  Hyperlipidemia - stable on statin  Bipolar - stable on movantik. No SI/HI  CVA - he has right hemiparesis but would like to take PT to improve ROM. Plans to begin PT in Feb 2017. He takes ASA daily  Hep C - stable liver enzymes.  Vision stable. He is out of lumigan eye gtts. He has not seen eye provider recently due to expense of copays  Abnormal CBC - [facility] and repeat CBC nml. No leukemia.  Past Medical History  Diagnosis Date  . Hypertension   . Arthritis   . Back ache   . Urinary frequency   . Generalized osteoarthrosis, involving multiple sites   . Dermatophytosis of the body   . Unspecified glaucoma   . Cirrhosis of liver without mention of alcohol   . Osteoarthrosis, unspecified whether generalized or localized, unspecified site   . Hyperlipidemia   . Stroke [facility])      12'15 -Right sided weakness remains  . Bipolar I disorder, most recent episode (or current) unspecified     no meds at present  . Chronic hepatitis C without mention of hepatic coma     tx. with meds/ was told" was cured"    Past Surgical History  Procedure Laterality Date  . Penile prosthesis implant  04/12/2010    Dr.Grapey, Insertion of 3 peice penile prosthesis   . Anal fissure repair  2008  . Abcess drainage      Gluteal MRSA drainage   . Colonoscopy  2008    Dr.Edwards, Internal Hemorrhoids   .  Cystoscopy N/A 01/24/2015    Procedure: CYSTOSCOPY FLEXIBLE;  Surgeon: Valetta Fuller, MD;  [facility];  Service: Urology;  Laterality: N/A;  . Penile prosthesis implant N/A 01/24/2015    Procedure: EXPLANTATION OF PENILE PROSTHESIS;  Surgeon: Valetta Fuller, MD;  Location[facility];  Service: Urology;  Laterality: N/A;    Patient Care Team: Kirt Boys, DO as PCP - General (Internal Medicine) Marvel Plan, MD as Consulting Physician (Neurology)  Social History   Social History  . Marital Status: Single    Spouse Name: N/A  . Number of Children: N/A  . Years of Education: N/A   Occupational History  . Not on file.   Social History Main Topics  . Smoking status: Former Smoker -- 5 years    Quit date: 12/17/2013  . Smokeless tobacco: Never Used  . Alcohol Use: 0.0 oz/week    0 Standard drinks or equivalent per week     Comment: wine rare occ.  . Drug Use: Yes    Special: Marijuana     Comment: 1-2 x per week.01-19-15 None advised prior to surgery.  . Sexual Activity: Yes   Other Topics Concern  .  Not on file   Social History Narrative     reports that he quit smoking about 2 years ago. He has never used smokeless tobacco. He reports that he drinks alcohol. He reports that he uses illicit drugs (Marijuana).  No Known Allergies  Medications: Patient's Medications  New Prescriptions   No medications on file  Previous Medications   ASPIRIN EC 325 MG TABLET    Take 1 tablet (325 mg total) by mouth daily.   ATORVASTATIN (LIPITOR) 20 MG TABLET    Take 1 tablet (20 mg total) by mouth daily at 6 PM. For Cholesterol   BIMATOPROST (LUMIGAN) 0.03 % OPHTHALMIC SOLUTION    Place 1 drop into both eyes at bedtime.   CLOTRIMAZOLE-BETAMETHASONE (LOTRISONE) CREAM    Apply 1 application topically 2 (two) times daily.   CYCLOBENZAPRINE (FLEXERIL) 5 MG TABLET    Take 1 tablet (5 mg total) by mouth 3 (three) times daily as needed for muscle spasms.   DIAZEPAM (VALIUM) 5 MG TABLET    Take 1  tablet (5 mg total) by mouth at bedtime.   DOCUSATE SODIUM (DSS) 100 MG CAPS    Take 100 mg by mouth 2 (two) times daily.   HYDROCHLOROTHIAZIDE (HYDRODIURIL) 50 MG TABLET    Take 1 tablet (50 mg total) by mouth daily with breakfast.   MELATONIN 5 MG CAPS    Take 1 capsule (5 mg total) by mouth at bedtime.   NALOXEGOL OXALATE (MOVANTIK) 25 MG TABS    Take 1 tablet (25 mg total) by mouth daily.   OXYCODONE (ROXICODONE) 15 MG IMMEDIATE RELEASE TABLET    Take 1 tablet (15 mg total) by mouth every 6 (six) hours as needed for pain.  Modified Medications   No medications on file  Discontinued Medications   No medications on file    Review of Systems  Unable to perform ROS: Psychiatric disorder    Filed Vitals:   04/11/16 1112  BP: 108/78  Pulse: 74  Temp: 98.3 F (36.8 C)  TempSrc: Oral  Resp: 20  Height:  (1.803 m)  Weight: 158 lb 3.2 oz (71.759 kg)  SpO2: 98%   Body mass index is 22.07 kg/(m^2).  Physical Exam  Constitutional: He is oriented to person, place, and time. He appears well-developed and well-nourished. No distress.  HENT:  Mouth/Throat: Oropharynx is clear and moist.  Eyes: Pupils are equal, round, and reactive to light. No scleral icterus.  Neck: Neck supple. Carotid bruit is not present. No thyromegaly present.  Cardiovascular: Normal rate, regular rhythm, normal heart sounds and intact distal pulses.  Exam reveals no gallop and no friction rub.   No murmur heard. no distal LE swelling. No calf TTP  Pulmonary/Chest: Effort normal and breath sounds normal. He has no wheezes. He has no rales. He exhibits no tenderness.  Abdominal: Soft. Bowel sounds are normal. He exhibits no distension, no abdominal bruit, no pulsatile midline mass and no mass. There is no splenomegaly or hepatomegaly. There is no tenderness. There is no rebound and no guarding.  Musculoskeletal:  Right hemiparesis. Gait unsteady  Lymphadenopathy:    He has no cervical adenopathy.    Neurological: He is alert and oriented to person, place, and time.  Skin: Skin is warm and dry. Rash (plaque like rash with min redness on buttock b/l) noted.  Psychiatric: He has a normal mood and affect. His behavior is normal. Thought content normal.     Labs reviewed: No visits with results within  3 Month(s) from this visit. Latest known visit with results is:  Abstract on 12/09/2015  Component Date Value Ref Range Status  . Hemoglobin 11/24/2015 14.8  13.5 - 17.5 g/dL Final  . HCT 04/54/098112/07/2015 42  41 - 53 % Final  . Platelets 11/24/2015 237  150 - 399 K/L Final  . WBC 11/24/2015 12.4   Final    No results found.   Assessment/Plan   ICD-9-CM ICD-10-CM   1. Dermatitis 692.9 L30.9   2. Leukocytosis 288.60 D72.829   3. Essential hypertension 401.9 I10   4. Hemiparesis affecting right side as late effect of cerebrovascular accident (HCC) 438.20 I69.351   5. Insomnia 780.52 G47.00   6. Hyperlipidemia 272.4 E78.5   7. Primary osteoarthritis involving multiple joints 715.09 M15.0    Continue current medications as ordered  Follow up in 4 mos for routine visit. [facility]   Lacrecia Delval S. Ancil Linseyarter, D. O., F. A. C. O. I.  [facility] [address] [place], [place] [address] [phone] Cell (Monday-Friday 8 AM - 5 PM) [phone] After 5 PM and follow prompts

## 2016-04-23 ENCOUNTER — Other Ambulatory Visit: Payer: Self-pay | Admitting: *Deleted

## 2016-04-23 MED ORDER — OXYCODONE HCL 15 MG PO TABS: 15.0000 mg | ORAL_TABLET | Freq: Four times a day (QID) | ORAL | Status: DC | PRN

## 2016-04-23 NOTE — Telephone Encounter (Signed)
Patient requested and will pick up. Patient has to go out of town for a [employer] tomorrow and unable to pick up. Will pick up today.

## 2016-05-24 ENCOUNTER — Other Ambulatory Visit: Payer: Self-pay

## 2016-05-24 MED ORDER — OXYCODONE HCL 15 MG PO TABS: 15.0000 mg | ORAL_TABLET | Freq: Four times a day (QID) | ORAL | Status: DC | PRN

## 2016-06-22 ENCOUNTER — Other Ambulatory Visit: Payer: Self-pay

## 2016-06-22 MED ORDER — OXYCODONE HCL 15 MG PO TABS: 15.0000 mg | ORAL_TABLET | Freq: Four times a day (QID) | ORAL | Status: DC | PRN

## 2016-07-03 ENCOUNTER — Telehealth: Payer: Self-pay | Admitting: Internal Medicine

## 2016-07-03 NOTE — Telephone Encounter (Signed)
Received [facility] Paperwork. Forms to get assistance. Given to Dr. Montez Moritaarter to complete and sign. Last OV and face sheet printed and included. [facility] Fax[facility] [phone]

## 2016-07-03 NOTE — Telephone Encounter (Signed)
Mr. Clydene PughWoodard dropped off a [facility] form to be filled out by Dr. Montez Moritaarter. He wants this form to [facility] Fax [phone] when finished.

## 2016-07-16 NOTE — Telephone Encounter (Signed)
[facility] Paperwork completed. Faxed to [facility] Fax: [phone] Patient notified and agreed and requested a copy. Copy left up front for pick up.

## 2016-07-20 ENCOUNTER — Other Ambulatory Visit: Payer: Self-pay | Admitting: *Deleted

## 2016-07-20 MED ORDER — OXYCODONE HCL 15 MG PO TABS: 15.0000 mg | ORAL_TABLET | Freq: Four times a day (QID) | ORAL | 0 refills | Status: DC | PRN

## 2016-07-20 NOTE — Telephone Encounter (Signed)
Patient requested and will pick up 

## 2016-07-20 NOTE — Telephone Encounter (Signed)
Patient aware rx is available for pick-up  
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
## 2016-07-23 IMAGING — CT CT ANGIO HEAD
1 of 12 series · 1 of 33 positions shown · IV contrast (Iohexol (Omnipaque 350))
Comparison: CT head 12/04/2014

CLINICAL DATA: Stroke.  Right-sided weakness.

EXAM:
CT ANGIOGRAPHY HEAD
TECHNIQUE: Multidetector CT imaging of the head was performed using the
standard protocol during bolus administration of intravenous
contrast. Multiplanar CT image reconstructions and MIPs were
obtained to evaluate the vascular anatomy.
CONTRAST:  50mL OMNIPAQUE IOHEXOL 350 MG/ML SOLN

[Series 300: locator · axial · 0.49mm/px · 1 of 1 slices shown]
[im 1/1  soft-tissue]
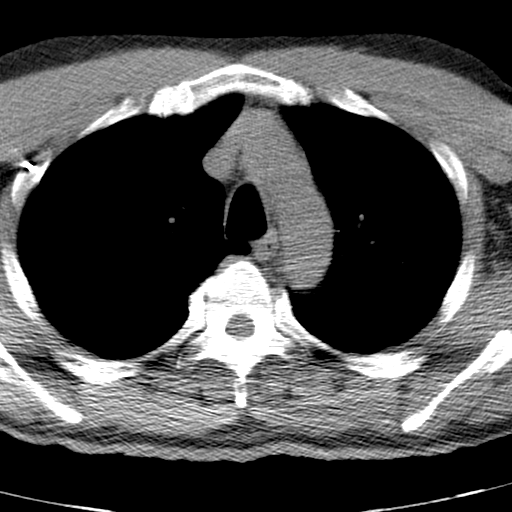

[1 of 33 positions shown; findings below may reference images not displayed]

FINDINGS: Interval development of a hypodensity in the left posterior limb
internal capsule and centrum semiovale. This is most consistent with
acute infarct.

8 mm hypodensity left anterior putamen is unchanged and appears
chronic. 1 cm hypodensity in the right cerebellum unchanged is
consistent with of infarct of indeterminate age. This was not
present on the CT of 7669. Negative for hemorrhage or mass.
Ventricle size is normal. No enhancing lesions are seen.

Left vertebral artery is dominant and widely patent. Right vertebral
artery nondominant with a small contribution to the basilar. PICA
patent bilaterally. Basilar is relatively small with fetal origin of
the posterior cerebral artery bilaterally. Superior cerebellar
artery is patent bilaterally.

Internal carotid artery widely patent. Anterior and middle cerebral
arteries widely patent. No significant stenosis or filling defect
identified

Negative for cerebral aneurysm.

Review of the MIP images confirms the above findings.
IMPRESSION: Acute infarct in the left posterior limb internal capsule and
centrum semiovale. This has developed since the prior CT of
12/04/2014.

[DATE] cm hypodensity right cerebellum consistent with infarct of
indeterminate age, likely chronic

Negative CTA head.

## 2016-08-03 ENCOUNTER — Telehealth: Payer: Self-pay | Admitting: *Deleted

## 2016-08-03 DIAGNOSIS — I10 Essential (primary) hypertension: Secondary | ICD-10-CM

## 2016-08-03 DIAGNOSIS — M150 Primary generalized (osteo)arthritis: Secondary | ICD-10-CM

## 2016-08-03 DIAGNOSIS — M159 Polyosteoarthritis, unspecified: Secondary | ICD-10-CM

## 2016-08-03 DIAGNOSIS — I69351 Hemiplegia and hemiparesis following cerebral infarction affecting right dominant side: Secondary | ICD-10-CM

## 2016-08-03 NOTE — Telephone Encounter (Signed)
[facility]

## 2016-08-03 NOTE — Telephone Encounter (Addendum)
Wants them to come to help him try and cook and help get dressed and clean up alittle.Patient states he has gotten worse. Wife is in a wheelchair and can't. Wants Dr. Montez Moritaarter to address. Please advise.

## 2016-08-03 NOTE — Telephone Encounter (Signed)
Order placed and patient notified 

## 2016-08-03 NOTE — Telephone Encounter (Signed)
Patient called and stated that he would like an order for a [facility] to come into home

## 2016-08-03 NOTE — Addendum Note (Signed)
Addended by: Nelda SevereMAY, ANITA A on: 08/03/2016 03:33 PM   Modules accepted: Orders

## 2016-08-08 ENCOUNTER — Telehealth: Payer: Self-pay | Admitting: Internal Medicine

## 2016-08-08 NOTE — Telephone Encounter (Signed)
I called [facility] and spoke with [place]. [place] verbalized understanding of additional orders and transferred me to a Clinical Representative to give a verbal order.  Verbal order given to Chrissy

## 2016-08-08 NOTE — Telephone Encounter (Signed)
Ok for PT eval and tx

## 2016-08-10 ENCOUNTER — Ambulatory Visit: Payer: Medicare Other | Admitting: Internal Medicine

## 2016-08-17 ENCOUNTER — Other Ambulatory Visit: Payer: Self-pay | Admitting: *Deleted

## 2016-08-17 MED ORDER — OXYCODONE HCL 15 MG PO TABS: 15.0000 mg | ORAL_TABLET | Freq: Four times a day (QID) | ORAL | 0 refills | Status: DC | PRN

## 2016-08-17 NOTE — Telephone Encounter (Signed)
Spoke with patient and advised rx ready for pick-up and it will be at the front desk.  

## 2016-08-17 NOTE — Telephone Encounter (Signed)
Patient requested and will pick up 

## 2016-09-06 ENCOUNTER — Telehealth: Payer: Self-pay

## 2016-09-06 NOTE — Telephone Encounter (Signed)
Patient walked into office today request a [facility] or [facility]. Statement to be faxed to [phone]. Call patient when complete.  Patient is getting assistance from [facility]   Copy of request placed in Dr.Carter's review and sign folder

## 2016-09-06 NOTE — Telephone Encounter (Signed)
What does he need the letter to say?

## 2016-09-12 ENCOUNTER — Ambulatory Visit (INDEPENDENT_AMBULATORY_CARE_PROVIDER_SITE_OTHER): Payer: Medicare Other | Admitting: Internal Medicine

## 2016-09-12 ENCOUNTER — Encounter: Payer: Self-pay | Admitting: Internal Medicine

## 2016-09-12 VITALS — BP 102/68 | HR 81 | Temp 97.3°F | Ht 71.0 in | Wt 157.0 lb

## 2016-09-12 DIAGNOSIS — I10 Essential (primary) hypertension: Secondary | ICD-10-CM | POA: Diagnosis not present

## 2016-09-12 DIAGNOSIS — M150 Primary generalized (osteo)arthritis: Secondary | ICD-10-CM

## 2016-09-12 DIAGNOSIS — I69351 Hemiplegia and hemiparesis following cerebral infarction affecting right dominant side: Secondary | ICD-10-CM

## 2016-09-12 DIAGNOSIS — Z23 Encounter for immunization: Secondary | ICD-10-CM | POA: Diagnosis not present

## 2016-09-12 DIAGNOSIS — E785 Hyperlipidemia, unspecified: Secondary | ICD-10-CM

## 2016-09-12 DIAGNOSIS — L309 Dermatitis, unspecified: Secondary | ICD-10-CM

## 2016-09-12 DIAGNOSIS — M159 Polyosteoarthritis, unspecified: Secondary | ICD-10-CM

## 2016-09-12 DIAGNOSIS — Z8673 Personal history of transient ischemic attack (TIA), and cerebral infarction without residual deficits: Secondary | ICD-10-CM | POA: Diagnosis not present

## 2016-09-12 NOTE — Progress Notes (Signed)
Patient ID: Nathan Buchanan, male   DOB: 1956/06/09, 60 y.o.   MRN: [id number]    Location:  [facility] Place of Service: OFFICE  Chief Complaint  Patient presents with  . Medical Management of Chronic Issues    4 month routine visit  . Flu Vaccine    requested    HPI:  60 yo male seen today for f/u. [facility]. He has a home care aid (Tues-Friday) from [facility] to assist with ADLs which is covered with [facility] benefits. They need a letter stating this need. He requires total care. No other concerns. [facility].   He is a poor historian due to psych d/o. Hx obtained from chart  HTN - BP stable on HCTZ. No muscle cramps. No palpitations.  Arthritis - pain is stable on roxicodone. He has been taking 2 valiums that "knocks me out" at night.  Hyperlipidemia - stable on statin  Bipolar - stable on movantik. No SI/HI  CVA - he has right hemiparesis. He completed PT. He takes ASA daily. Uses power scooter to get around.  Hep C - stable liver enzymes.  Vision stable. He is out of [facility] eye gtts. He has not seen eye provider recently due to expense of copays  Abnormal CBC - he [facility]u in Feb 2017 and repeat CBC nml. No leukemia.  Dermatitis - improved with topical agent  Past Medical History:  Diagnosis Date  . Arthritis   . Back ache   . Bipolar I disorder, most recent episode (or current) unspecified    no meds at present  . Chronic hepatitis C without mention of hepatic coma    tx. with meds/ was told" was cured"  . Cirrhosis of liver without mention of alcohol   . Dermatophytosis of the body   . Generalized osteoarthrosis, involving multiple sites   . Hyperlipidemia   . Hypertension   . Osteoarthrosis, unspecified whether generalized or localized, unspecified site   . Stroke [facility](HCC)     12'15 -Right sided weakness remains  . Unspecified glaucoma   . Urinary frequency     Past Surgical  History:  Procedure Laterality Date  . ABCESS DRAINAGE     Gluteal MRSA drainage   . ANAL FISSURE REPAIR  2008  . COLONOSCOPY  2008   Dr.Edwards, Internal Hemorrhoids   . CYSTOSCOPY N/A 01/24/2015   Procedure: CYSTOSCOPY FLEXIBLE;  Surgeon: Valetta Fulleravid S Grapey, MD;  Location: [facility];  Service: Urology;  Laterality: N/A;  . PENILE PROSTHESIS IMPLANT  04/12/2010   Dr.Grapey, Insertion of 3 peice penile prosthesis   . PENILE PROSTHESIS IMPLANT N/A 01/24/2015   Procedure: EXPLANTATION OF PENILE PROSTHESIS;  Surgeon: Valetta Fulleravid S Grapey, MD;  Location: [facility];  Service: Urology;  Laterality: N/A;    Patient Care Team: Kirt BoysMonica Misako Roeder, DO as PCP - General (Internal Medicine) Marvel PlanJindong Xu, MD as Consulting Physician (Neurology)  Social History   Social History  . Marital status: Single    Spouse name: N/A  . Number of children: N/A  . Years of education: N/A   Occupational History  . Not on file.   Social History Main Topics  . Smoking status: Former Smoker    Years: 5.00    Quit date: 12/17/2013  . Smokeless tobacco: Never Used  . Alcohol use 0.0 oz/week     Comment: wine rare occ.  . Drug use:  Types: Marijuana     Comment: 1-2 x per week.01-19-15 None advised prior to surgery.  . Sexual activity: Yes   Other Topics Concern  . Not on file   Social History Narrative  . No narrative on file     reports that he quit smoking about 2 years ago. He quit after 5.00 years of use. He has never used smokeless tobacco. He reports that he drinks alcohol. He reports that he uses drugs, including Marijuana.  Family History  Problem Relation Age of Onset  . Hypertension Mother   . Diabetes Mother   . Stroke Father   . Cancer Father     colon  . Diabetes Sister   . Diabetes Sister   . Diabetes Brother    Family Status  Relation Status  . Mother Deceased  . Father Deceased  . Sister Alive  . Brother Alive  . Daughter Alive  . Son Alive  . Sister Alive  . Sister Alive  . Sister Alive    . Sister Alive  . Brother Alive  . Brother Alive  . Daughter Alive  . Son Alive  . Son Alive  . Sister   . Sister   . Brother      No Known Allergies  Medications: Patient's Medications  New Prescriptions   No medications on file  Previous Medications   ASPIRIN EC 325 MG TABLET    Take 1 tablet (325 mg total) by mouth daily.   ATORVASTATIN (LIPITOR) 20 MG TABLET    Take 1 tablet (20 mg total) by mouth daily at 6 PM. For Cholesterol   BIMATOPROST (LUMIGAN) 0.03 % OPHTHALMIC SOLUTION    Place 1 drop into both eyes at bedtime.   CLOTRIMAZOLE-BETAMETHASONE (LOTRISONE) CREAM    Apply 1 application topically 2 (two) times daily.   CYCLOBENZAPRINE (FLEXERIL) 5 MG TABLET    Take 1 tablet (5 mg total) by mouth 3 (three) times daily as needed for muscle spasms.   DIAZEPAM (VALIUM) 5 MG TABLET    Take 1 tablet (5 mg total) by mouth at bedtime.   DOCUSATE SODIUM (DSS) 100 MG CAPS    Take 100 mg by mouth 2 (two) times daily.   HYDROCHLOROTHIAZIDE (HYDRODIURIL) 50 MG TABLET    Take 1 tablet (50 mg total) by mouth daily with breakfast.   MELATONIN 5 MG CAPS    Take 1 capsule (5 mg total) by mouth at bedtime.   NALOXEGOL OXALATE (MOVANTIK) 25 MG TABS    Take 1 tablet (25 mg total) by mouth daily.   OXYCODONE (ROXICODONE) 15 MG IMMEDIATE RELEASE TABLET    Take 1 tablet (15 mg total) by mouth every 6 (six) hours as needed for pain.  Modified Medications   No medications on file  Discontinued Medications   No medications on file    Review of Systems  Unable to perform ROS: Psychiatric disorder    Vitals:   09/12/16 1136  BP: 102/68  Pulse: 81  Temp: 97.3 F (36.3 C)  TempSrc: Oral  SpO2: 97%  Weight: 157 lb (71.2 kg)  Height: 5\' 11"  (1.803 m)   Body mass index is 21.9 kg/m.  Physical Exam  Constitutional: He appears well-developed and well-nourished. No distress.  Frail appearing in NAD  HENT:  Mouth/Throat: Oropharynx is clear and moist.  Eyes: Pupils are equal, round, and  reactive to light. No scleral icterus.  Neck: Neck supple. Carotid bruit is not present. No thyromegaly present.  Cardiovascular: Normal rate,  regular rhythm and intact distal pulses.  Exam reveals no gallop and no friction rub.   Murmur (1/6 SEM) heard. no distal LE swelling. No calf TTP  Pulmonary/Chest: Effort normal and breath sounds normal. He has no wheezes. He has no rales. He exhibits no tenderness.  Abdominal: Soft. Bowel sounds are normal. He exhibits no distension, no abdominal bruit, no pulsatile midline mass and no mass. There is no splenomegaly or hepatomegaly. There is no tenderness. There is no rebound and no guarding.  Musculoskeletal: He exhibits edema.  Right hemiparesis. Gait unsteady. Uses cane  Lymphadenopathy:    He has no cervical adenopathy.  Neurological: He is alert.  Skin: Skin is warm and dry. Rash (plaque like rash with min redness on R>L neck. no vesicular formation) noted.  Psychiatric: He has a normal mood and affect. His behavior is normal. Thought content normal.     Labs reviewed: No visits with results within 3 Month(s) from this visit.  Latest known visit with results is:  Abstract on 12/09/2015  Component Date Value Ref Range Status  . Hemoglobin 11/24/2015 14.8  13.5 - 17.5 g/dL Final  . HCT 09/81/1914 42  41 - 53 % Final  . Platelets 11/24/2015 237  150 - 399 K/L Final  . WBC 11/24/2015 12.4  10^3/mL Final    No results found.   Assessment/Plan   ICD-9-CM ICD-10-CM   1. Primary osteoarthritis involving multiple joints 715.09 M15.0   2. Hemiparesis affecting right side as late effect of cerebrovascular accident (HCC) 438.20 I69.351   3. Essential hypertension 401.9 I10   4. Dermatitis 692.9 L30.9   5. Hyperlipidemia 272.4 E78.5   6. History of stroke V12.54 Z86.73   7. Encounter for immunization Z23 Z23 Flu Vaccine QUAD 36+ mos IM   Flu shot given today  Letter to be faxed to [facility] so he can continue home care aid 4 times per  week  Continue current medications as ordered  Call office in morning to get pain script on Friday Sept 29th  Follow up in 4 mos for CPE/ECG. Fasting labs prior to appt (cbc w diff, cmp, lipid panel, tsh, ua w micro, psa)   Miller Limehouse S. Ancil Linsey  [facility] [address] [place], [place] [address] [phone] Cell (Monday-Friday 8 AM - 5 PM) [phone] After 5 PM and follow prompts

## 2016-09-12 NOTE — Patient Instructions (Addendum)
Flu shot given today  [facility] care [facility] 4 times per week  Continue current medications as ordered  Call office in morning to get pain script on Friday Sept 29th  Follow up in 4 mos for CPE/ECG. Fasting labs prior to appt

## 2016-09-14 ENCOUNTER — Other Ambulatory Visit: Payer: Self-pay | Admitting: *Deleted

## 2016-09-14 MED ORDER — OXYCODONE HCL 15 MG PO TABS: 15.0000 mg | ORAL_TABLET | Freq: Four times a day (QID) | ORAL | 0 refills | Status: DC | PRN

## 2016-10-03 ENCOUNTER — Other Ambulatory Visit: Payer: Self-pay | Admitting: Internal Medicine

## 2016-10-08 ENCOUNTER — Other Ambulatory Visit: Payer: Self-pay | Admitting: Internal Medicine

## 2016-10-15 ENCOUNTER — Other Ambulatory Visit: Payer: Self-pay | Admitting: *Deleted

## 2016-10-15 MED ORDER — OXYCODONE HCL 15 MG PO TABS: 15.0000 mg | ORAL_TABLET | Freq: Four times a day (QID) | ORAL | 0 refills | Status: DC | PRN

## 2016-10-15 NOTE — Telephone Encounter (Signed)
Patient requested and will pick up 

## 2016-11-16 ENCOUNTER — Other Ambulatory Visit: Payer: Self-pay | Admitting: *Deleted

## 2016-11-16 MED ORDER — OXYCODONE HCL 15 MG PO TABS: 15.0000 mg | ORAL_TABLET | Freq: Four times a day (QID) | ORAL | 0 refills | Status: DC | PRN

## 2016-11-16 NOTE — Telephone Encounter (Signed)
Patient requested and will pick up 

## 2016-12-14 ENCOUNTER — Other Ambulatory Visit: Payer: Self-pay | Admitting: *Deleted

## 2016-12-14 MED ORDER — OXYCODONE HCL 15 MG PO TABS: 15.0000 mg | ORAL_TABLET | Freq: Four times a day (QID) | ORAL | 0 refills | Status: DC | PRN

## 2016-12-14 NOTE — Telephone Encounter (Signed)
Patient requested and will pick up 

## 2017-01-10 ENCOUNTER — Ambulatory Visit (INDEPENDENT_AMBULATORY_CARE_PROVIDER_SITE_OTHER): Payer: Medicare Other

## 2017-01-10 ENCOUNTER — Other Ambulatory Visit: Payer: Medicare Other

## 2017-01-10 VITALS — BP 94/70 | HR 72 | Temp 98.0°F | Ht 71.0 in | Wt 150.8 lb

## 2017-01-10 DIAGNOSIS — E782 Mixed hyperlipidemia: Secondary | ICD-10-CM | POA: Diagnosis not present

## 2017-01-10 DIAGNOSIS — E784 Other hyperlipidemia: Secondary | ICD-10-CM | POA: Diagnosis not present

## 2017-01-10 DIAGNOSIS — Z0000 Encounter for general adult medical examination without abnormal findings: Secondary | ICD-10-CM

## 2017-01-10 DIAGNOSIS — I10 Essential (primary) hypertension: Secondary | ICD-10-CM | POA: Diagnosis not present

## 2017-01-10 DIAGNOSIS — E7849 Other hyperlipidemia: Secondary | ICD-10-CM

## 2017-01-10 DIAGNOSIS — Z79899 Other long term (current) drug therapy: Secondary | ICD-10-CM | POA: Diagnosis not present

## 2017-01-10 LAB — COMPLETE METABOLIC PANEL WITH GFR
ALT: 16 U/L (ref 9–46)
AST: 23 U/L (ref 10–35)
Albumin: 4.1 g/dL (ref 3.6–5.1)
Alkaline Phosphatase: 65 U/L (ref 40–115)
BUN: 17 mg/dL (ref 7–25)
CALCIUM: 9.2 mg/dL (ref 8.6–10.3)
CHLORIDE: 106 mmol/L (ref 98–110)
CO2: 25 mmol/L (ref 20–31)
CREATININE: 1.09 mg/dL (ref 0.70–1.25)
GFR, EST AFRICAN AMERICAN: 85 mL/min (ref >=60)
GFR, EST NON AFRICAN AMERICAN: 73 mL/min (ref >=60)
Glucose, Bld: 93 mg/dL (ref 65–99)
POTASSIUM: 4.2 mmol/L (ref 3.5–5.3)
Sodium: 138 mmol/L (ref 135–146)
Total Bilirubin: 0.5 mg/dL (ref 0.2–1.2)
Total Protein: 7.8 g/dL (ref 6.1–8.1)

## 2017-01-10 LAB — LIPID PANEL
CHOL/HDL RATIO: 3.1 ratio (ref <5.0)
CHOLESTEROL: 157 mg/dL (ref <200)
HDL: 50 mg/dL (ref >40)
LDL Cholesterol: 79 mg/dL (ref <100)
TRIGLYCERIDES: 142 mg/dL (ref <150)
VLDL: 28 mg/dL (ref <30)

## 2017-01-10 LAB — PSA: PSA: 0.6 ng/mL (ref <=4.0)

## 2017-01-10 LAB — TSH: TSH: 1.86 mIU/L (ref 0.40–4.50)

## 2017-01-10 NOTE — Patient Instructions (Addendum)
Nathan Buchanan , Thank you for taking time to come for your Medicare Wellness Visit. I appreciate your ongoing commitment to your health goals. Please review the following plan we discussed and let me know if I can assist you in the future.   These are the goals we discussed: Goals    . <enter goal here>          Starting 01/10/17, I will maintain my current lifestyle.        This is a list of the screening recommended for you and due dates:  Health Maintenance  Topic Date Due  . HIV Screening  10/30/1971  . Shingles Vaccine  10/29/2016  . Colon Cancer Screening  12/17/2018  . Tetanus Vaccine  10/22/2022  . Flu Shot  Completed  .  Hepatitis C: One time screening is recommended by Center for Disease Control  (CDC) for  adults born from [birth year] through 1965.   Completed  Preventive Care for Adults  A healthy lifestyle and preventive care can promote health and wellness. Preventive health guidelines for adults include the following key practices.  . A routine yearly physical is a good way to check with your health care provider about your health and preventive screening. It is a chance to share any concerns and updates on your health and to receive a thorough exam.  . Visit your dentist for a routine exam and preventive care every 6 months. Brush your teeth twice a day and floss once a day. Good oral hygiene prevents tooth decay and gum disease.  . The frequency of eye exams is based on your age, health, family medical history, use  of contact lenses, and other factors. Follow your health care provider's ecommendations for frequency of eye exams.  . Eat a healthy diet. Foods like vegetables, fruits, whole grains, low-fat dairy products, and lean protein foods contain the nutrients you need without too many calories. Decrease your intake of foods high in solid fats, added sugars, and salt. Eat the right amount of calories for you. Get information about a proper diet from your health care  provider, if necessary.  . Regular physical exercise is one of the most important things you can do for your health. Most adults should get at least 150 minutes of moderate-intensity exercise (any activity that increases your heart rate and causes you to sweat) each week. In addition, most adults need muscle-strengthening exercises on 2 or more days a week.  Silver Sneakers may be a benefit available to you. To determine eligibility, you may visit the website: www.silversneakers.com or contact program at [phone] Mon-Fri between 8AM-8PM.   . Maintain a healthy weight. The body mass index (BMI) is a screening tool to identify possible weight problems. It provides an estimate of body fat based on height and weight. Your health care provider can find your BMI and can help you achieve or maintain a healthy weight.   For adults 20 years and older: ? A BMI below 18.5 is considered underweight. ? A BMI of 18.5 to 24.9 is normal. ? A BMI of 25 to 29.9 is considered overweight. ? A BMI of 30 and above is considered obese.   . Maintain normal blood lipids and cholesterol levels by exercising and minimizing your intake of saturated fat. Eat a balanced diet with plenty of fruit and vegetables. Blood tests for lipids and cholesterol should begin at age 61 and be repeated every 5 years. If your lipid or cholesterol levels are high, you are  over 31, or you are at high risk for heart disease, you may need your cholesterol levels checked more frequently. Ongoing high lipid and cholesterol levels should be treated with medicines if diet and exercise are not working.  . If you smoke, find out from your health care provider how to quit. If you do not use tobacco, please do not start.  . If you choose to drink alcohol, please do not consume more than 2 drinks per day. One drink is considered to be 12 ounces (355 mL) of beer, 5 ounces (148 mL) of wine, or 1.5 ounces (44 mL) of liquor.  . If you are 64-79 years  old, ask your health care provider if you should take aspirin to prevent strokes.  . Use sunscreen. Apply sunscreen liberally and repeatedly throughout the day. You should seek shade when your shadow is shorter than you. Protect yourself by wearing long sleeves, pants, a wide-brimmed hat, and sunglasses year round, whenever you are outdoors.  . Once a month, do a whole body skin exam, using a mirror to look at the skin on your back. Tell your health care provider of new moles, moles that have irregular borders, moles that are larger than a pencil eraser, or moles that have changed in shape or color.

## 2017-01-10 NOTE — Progress Notes (Signed)
   I reviewed health advisor's note, was available for consultation and agree with the assessment and plan as written.   Will send copy to Dr. Montez Moritaarter so she is aware of his concerns about his bp medications.  Viney Acocella L. Imojean Yoshino, D.O. Geriatrics [facility] [address] [place] [place] [place], [place] [address] Cell Phone (Mon-Fri 8am-5pm):  [phone] On Call:  [phone] & follow prompts after 5pm & weekends Office Phone:  [phone] Office Fax:  [phone]   Quick Notes   Health Maintenance:  Up to date on maintenance    Abnormal Screen: None; MMSE-26/30-Unable to draw or write due to stroke.     Patient Concerns:  Pt is concerned about his BP meds being too strong of a dose b/c he states his BP stays low. He would like to discuss.     Nurse Concerns:  None

## 2017-01-10 NOTE — Progress Notes (Signed)
Subjective:   Nathan Buchanan is a 61 y.o. male who presents for an Initial Medicare Annual Wellness Visit.  Review of Systems  Cardiac Risk Factors include: advanced age (>54men, >68 women);dyslipidemia;smoking/ tobacco exposure;male gender;hypertension    Objective:    Today's Vitals   01/10/17 1105  BP: 94/70  Pulse: 72  Temp: 98 F (36.7 C)  TempSrc: Oral  SpO2: 98%  Weight: 150 lb 12.8 oz (68.4 kg)  Height: 5\' 11"  (1.803 m)   Body mass index is 21.03 kg/m.  Current Medications (verified) Outpatient Encounter Prescriptions as of 01/10/2017  Medication Sig  . aspirin EC 325 MG tablet Take 1 tablet (325 mg total) by mouth daily.  Marland Kitchen atorvastatin (LIPITOR) 20 MG tablet Take 1 tablet (20 mg total) by mouth daily at 6 PM. For Cholesterol  . bimatoprost (LUMIGAN) 0.03 % ophthalmic solution Place 1 drop into both eyes at bedtime.  . clotrimazole-betamethasone (LOTRISONE) cream Apply 1 application topically 2 (two) times daily.  . cyclobenzaprine (FLEXERIL) 5 MG tablet Take 1 tablet (5 mg total) by mouth 3 (three) times daily as needed for muscle spasms.  . diazepam (VALIUM) 5 MG tablet take 1 tablet by mouth at bedtime  . Docusate Sodium (DSS) 100 MG CAPS Take 100 mg by mouth 2 (two) times daily.  . hydrochlorothiazide (HYDRODIURIL) 50 MG tablet Take 1 tablet (50 mg total) by mouth daily with breakfast.  . Melatonin 5 MG CAPS Take 1 capsule (5 mg total) by mouth at bedtime.  [place] Oxalate (MOVANTIK) 25 MG TABS Take 1 tablet (25 mg total) by mouth daily.  Marland Kitchen oxyCODONE (ROXICODONE) 15 MG immediate release tablet Take 1 tablet (15 mg total) by mouth every 6 (six) hours as needed for pain.   No facility-administered encounter medications on file as of 01/10/2017.     Allergies (verified) Patient has no known allergies.   History: Past Medical History:  Diagnosis Date  . Arthritis   . Back ache   . Bipolar I disorder, most recent episode (or current) unspecified    no  meds at present  . Chronic hepatitis C without mention of hepatic coma    tx. with meds/ was told" was cured"  . Cirrhosis of liver without mention of alcohol   . Dermatophytosis of the body   . Generalized osteoarthrosis, involving multiple sites   . Hyperlipidemia   . Hypertension   . Osteoarthrosis, unspecified whether generalized or localized, unspecified site   . Stroke [facility]     12'15 -Right sided weakness remains  . Unspecified glaucoma(365.9)   . Urinary frequency    Past Surgical History:  Procedure Laterality Date  . ABCESS DRAINAGE     Gluteal MRSA drainage   . ANAL FISSURE REPAIR  2008  . COLONOSCOPY  2008   Dr.Edwards, Internal Hemorrhoids   . CYSTOSCOPY N/A 01/24/2015   Procedure: CYSTOSCOPY FLEXIBLE;  Surgeon: Valetta Fuller, MD;  Location: [facility];  Service: Urology;  Laterality: N/A;  . PENILE PROSTHESIS IMPLANT  04/12/2010   Dr.Grapey, Insertion of 3 peice penile prosthesis   . PENILE PROSTHESIS IMPLANT N/A 01/24/2015   Procedure: EXPLANTATION OF PENILE PROSTHESIS;  Surgeon: Valetta Fuller, MD;  Location: [facility];  Service: Urology;  Laterality: N/A;   Family History  Problem Relation Age of Onset  . Hypertension Mother   . Diabetes Mother   . Stroke Father   . Cancer Father     colon  . Diabetes Sister   . Diabetes  Sister   . Diabetes Brother    Social History   Occupational History  . Not on file.   Social History Main Topics  . Smoking status: Former Smoker    Years: 5.00    Quit date: 12/17/2013  . Smokeless tobacco: Never Used  . Alcohol use 0.0 oz/week     Comment: wine rare occ.  . Drug use: Yes    Types: Marijuana     Comment: 1-2 x per week.01-19-15 None advised prior to surgery.  . Sexual activity: Yes   Tobacco Counseling Counseling given: No   Activities of Daily Living In your present state of health, do you have any difficulty performing the following activities: 01/10/2017  Hearing? N  Vision? N  Difficulty concentrating or making  decisions? N  Walking or climbing stairs? Y  Dressing or bathing? Y  Doing errands, shopping? N  Preparing Food and eating ? N  Using the Toilet? N  In the past six months, have you accidently leaked urine? N  Do you have problems with loss of bowel control? N  Managing your Medications? Y  Managing your Finances? N  Housekeeping or managing your Housekeeping? Y  Some recent data might be hidden    Immunizations and Health Maintenance Immunization History  Administered Date(s) Administered  . Influenza Whole 10/17/2006  . Influenza,inj,Quad PF,36+ Mos 09/12/2016  . Influenza-Unspecified 08/21/2013, 11/16/2014, 07/29/2015  . Pneumococcal-Unspecified 11/16/2014  . Rabies, IM 10/22/2012  . Rabies, intradermal 10/22/2012  . Tdap 10/22/2012   Health Maintenance Due  Topic Date Due  . HIV Screening  10/30/1971  . ZOSTAVAX  10/29/2016    Patient Care Team: Kirt Boys, DO as PCP - General (Internal Medicine) Marvel Plan, MD as Consulting Physician (Neurology)  Indicate any recent Medical Services you may have received from other than [facility] providers in the past year (date may be approximate).    Assessment:   This is a routine wellness examination for [name].   Hearing/Vision screen Vision Screening Comments: [facility]  Dietary issues and exercise activities discussed: Current Exercise Habits: The patient does not participate in regular exercise at present, Exercise limited by: None identified;neurologic condition(s);orthopedic condition(s)  Goals    . <enter goal here>          Starting 01/10/17, I will maintain my current lifestyle.       Depression Screen PHQ 2/9 Scores 01/10/2017 09/12/2016 09/09/2015 07/13/2015  PHQ - 2 Score 0 4 0 2  PHQ- 9 Score - - - 12    Fall Risk Fall Risk  01/10/2017 09/12/2016 12/09/2015 09/09/2015 07/13/2015  Falls in the past year? No Yes No No No  Number falls in past yr: - 1 - - -  Injury with  Fall? - Yes - - -    Cognitive Function: MMSE - Mini Mental State Exam 01/10/2017  Orientation to time 5  Orientation to Place 5  Registration 3  Attention/ Calculation 3  Recall 3  Language- name 2 objects 2  Language- repeat 1  Language- follow 3 step command 3  Language- read & follow direction 1  Write a sentence 0  Copy design 0  Total score 26        Screening Tests Health Maintenance  Topic Date Due  . HIV Screening  10/30/1971  . ZOSTAVAX  10/29/2016  . COLONOSCOPY  12/17/2018  . TETANUS/TDAP  10/22/2022  . INFLUENZA VACCINE  Completed  . Hepatitis C Screening  Completed        Plan:    I have personally reviewed and addressed the Medicare Annual Wellness questionnaire and have noted the following in the patient's chart:  A. Medical and social history B. Use of alcohol, tobacco or illicit drugs  C. Current medications and supplements D. Functional ability and status E.  Nutritional status F.  Physical activity G. Advance directives H. List of other physicians I.  Hospitalizations, surgeries, and ER visits in previous 12 months J.  Vitals K. Screenings to include hearing, vision, cognitive, depression L. Referrals and appointments - none  In addition, I have reviewed and discussed with patient certain preventive protocols, quality metrics, and best practice recommendations. A written personalized care plan for preventive services as well as general preventive health recommendations were provided to patient.  See attached scanned questionnaire for additional information.   Signed,   Nilda CalamityAlisa Kerston Landeck, LPN Health Advisor

## 2017-01-11 ENCOUNTER — Other Ambulatory Visit: Payer: Self-pay | Admitting: *Deleted

## 2017-01-11 LAB — CBC WITH DIFFERENTIAL/PLATELET
Basophils Absolute: 81 cells/uL (ref 0–200)
Basophils Relative: 1 %
EOS ABS: 81 {cells}/uL (ref 15–500)
Eosinophils Relative: 1 %
HEMATOCRIT: 42.3 % (ref 38.5–50.0)
Hemoglobin: 14.5 g/dL (ref 13.2–17.1)
LYMPHS PCT: 75 %
Lymphs Abs: 6075 cells/uL — ABNORMAL HIGH (ref 850–3900)
MCH: 30.7 pg (ref 27.0–33.0)
MCHC: 34.3 g/dL (ref 32.0–36.0)
MCV: 89.4 fL (ref 80.0–100.0)
MONO ABS: 729 {cells}/uL (ref 200–950)
MONOS PCT: 9 %
MPV: 10.4 fL (ref 7.5–12.5)
NEUTROS PCT: 14 %
Neutro Abs: 1134 cells/uL — ABNORMAL LOW (ref 1500–7800)
Platelets: 197 10*3/uL (ref 140–400)
RBC: 4.73 MIL/uL (ref 4.20–5.80)
RDW: 14.1 % (ref 11.0–15.0)
WBC: 8.1 10*3/uL (ref 3.8–10.8)

## 2017-01-11 MED ORDER — OXYCODONE HCL 15 MG PO TABS: 15.0000 mg | ORAL_TABLET | Freq: Four times a day (QID) | ORAL | 0 refills | Status: DC | PRN

## 2017-01-11 NOTE — Telephone Encounter (Signed)
Patient requested and will pick up 

## 2017-01-14 ENCOUNTER — Other Ambulatory Visit: Payer: Medicare Other

## 2017-01-16 ENCOUNTER — Encounter: Payer: Self-pay | Admitting: Internal Medicine

## 2017-01-16 ENCOUNTER — Ambulatory Visit (INDEPENDENT_AMBULATORY_CARE_PROVIDER_SITE_OTHER): Payer: Medicare Other | Admitting: Internal Medicine

## 2017-01-16 VITALS — BP 118/86 | HR 71 | Temp 98.0°F | Ht 71.0 in | Wt 150.2 lb

## 2017-01-16 DIAGNOSIS — I10 Essential (primary) hypertension: Secondary | ICD-10-CM | POA: Diagnosis not present

## 2017-01-16 DIAGNOSIS — Z79899 Other long term (current) drug therapy: Secondary | ICD-10-CM

## 2017-01-16 DIAGNOSIS — Z0000 Encounter for general adult medical examination without abnormal findings: Secondary | ICD-10-CM

## 2017-01-16 DIAGNOSIS — M62838 Other muscle spasm: Secondary | ICD-10-CM | POA: Diagnosis not present

## 2017-01-16 DIAGNOSIS — M150 Primary generalized (osteo)arthritis: Secondary | ICD-10-CM

## 2017-01-16 DIAGNOSIS — E782 Mixed hyperlipidemia: Secondary | ICD-10-CM | POA: Diagnosis not present

## 2017-01-16 DIAGNOSIS — F310 Bipolar disorder, current episode hypomanic: Secondary | ICD-10-CM | POA: Diagnosis not present

## 2017-01-16 DIAGNOSIS — Z8673 Personal history of transient ischemic attack (TIA), and cerebral infarction without residual deficits: Secondary | ICD-10-CM

## 2017-01-16 DIAGNOSIS — M159 Polyosteoarthritis, unspecified: Secondary | ICD-10-CM

## 2017-01-16 DIAGNOSIS — I69351 Hemiplegia and hemiparesis following cerebral infarction affecting right dominant side: Secondary | ICD-10-CM | POA: Diagnosis not present

## 2017-01-16 MED ORDER — DIAZEPAM 5 MG PO TABS: 5.0000 mg | ORAL_TABLET | Freq: Every day | ORAL | 3 refills | Status: DC

## 2017-01-16 NOTE — Patient Instructions (Addendum)
Encouraged him to exercise 30-45 minutes 4-5 times per week. Eat a well balanced diet. Avoid smoking. Limit alcohol intake. Wear seatbelt when riding in the car. Wear sun block (SPF >50) when spending extended times outside.  Continue current medications as ordered  [facility]  Follow up in 4 mos for routine visit. Fasting labs prior to appt

## 2017-01-16 NOTE — Progress Notes (Signed)
Patient ID: Nathan Buchanan, male   DOB: Feb 16, 1956, 61 y.o.   MRN: [id number]   Location:  [facility]  Place of Service:  OFFICE  Provider: Arletha Grippe, DO  Patient Care Team: Gildardo Cranker, DO as PCP - General (Internal Medicine) Rosalin Hawking, MD as Consulting Physician (Neurology)  Extended Emergency Contact Information Primary Emergency Contact: [facility] Address: [address]          [place], [place] [address] Johnnette Litter of [place] Phone: [phone] Mobile Phone: [phone] Relation: Significant other  Code Status: FULL CODE Goals of Care: Advanced Directive information Advanced Directives 01/16/2017  Does Patient Have a Medical Advance Directive? No  Would patient like information on creating a medical advance directive? No - Patient declined     Chief Complaint  Patient presents with  . Annual Exam    extended visit    HPI: Patient is a 61 y.o. male seen in today for an annual wellness exam.    He is a poor historian due to psych d/o. Hx obtained from chart. He is followed by [facility] providers.  HTN - BP stable on HCTZ. No muscle cramps. No palpitations.  Arthritis - pain is stable on roxicodone. He has been taking 2 valiums that "knocks me out" at night.  Hyperlipidemia - stable on statin  Bipolar - stable on movantik. No SI/HI  CVA - he has right hemiparesis. He completed PT. He takes ASA daily. Uses power scooter to get around.  Hep C - stable liver enzymes.  Vision stable. He is out of [facility]. [facility]  Abnormal CBC - he saw [place] provider for f/u in Feb 2017 and repeat CBC nml. No leukemia.  Dermatitis - improved with topical agent  Depression screen [facility] 2/9 01/10/2017 09/12/2016 09/09/2015 07/13/2015 07/13/2015  Decreased Interest 0 2 0 - 0  Down, Depressed, Hopeless 0 2 0 2 0  PHQ - 2 Score 0 4 0 2 0  Altered sleeping - - - 3 -  Tired, decreased energy - - - 2 -  Change in appetite - - - 1 -  Feeling  bad or failure about yourself  - - - 1 -  Trouble concentrating - - - 1 -  Moving slowly or fidgety/restless - - - 2 -  Suicidal thoughts - - - 0 -  PHQ-9 Score - - - 12 -    Fall Risk  01/16/2017 01/10/2017 09/12/2016 12/09/2015 09/09/2015  Falls in the past year? No No Yes No No  Number falls in past yr: - - 1 - -  Injury with Fall? - - Yes - -   MMSE - Mini Mental State Exam 01/10/2017  Orientation to time 5  Orientation to Place 5  Registration 3  Attention/ Calculation 3  Recall 3  Language- name 2 objects 2  Language- repeat 1  Language- follow 3 step command 3  Language- read & follow direction 1  Write a sentence 0  Copy design 0  Total score 26     Health Maintenance  Topic Date Due  . HIV Screening  10/30/1971  . ZOSTAVAX  10/29/2016  . COLONOSCOPY  12/17/2018  . TETANUS/TDAP  10/22/2022  . INFLUENZA VACCINE  Completed  . Hepatitis C Screening  Completed     Functional Status Survey: Is the patient deaf or have difficulty hearing?: No Does the patient have difficulty seeing, even when wearing glasses/contacts?: No Does the patient have difficulty  concentrating, remembering, or making decisions?: No Does the patient have difficulty walking or climbing stairs?: Yes Does the patient have difficulty dressing or bathing?: Yes Does the patient have difficulty doing errands alone such as visiting a doctor's office or shopping?: No  No exam data present  Past Medical History:  Diagnosis Date  . Arthritis   . Back ache   . Bipolar I disorder, most recent episode (or current) unspecified    no meds at present  . Chronic hepatitis C without mention of hepatic coma    tx. with meds/ was told" was cured"  . Cirrhosis of liver without mention of alcohol   . Dermatophytosis of the body   . Generalized osteoarthrosis, involving multiple sites   . Hyperlipidemia   . Hypertension   . Osteoarthrosis, unspecified whether generalized or localized, unspecified site   .  Stroke [facility])     12'15 -Right sided weakness remains  . Unspecified glaucoma(365.9)   . Urinary frequency     Past Surgical History:  Procedure Laterality Date  . ABCESS DRAINAGE     Gluteal MRSA drainage   . ANAL FISSURE REPAIR  2008  . COLONOSCOPY  2008   Dr.Edwards, Internal Hemorrhoids   . CYSTOSCOPY N/A 01/24/2015   Procedure: CYSTOSCOPY FLEXIBLE;  Surgeon: Bernestine Amass, MD;  Location: [facility];  Service: Urology;  Laterality: N/A;  . PENILE PROSTHESIS IMPLANT  04/12/2010   Dr.Grapey, Insertion of 3 peice penile prosthesis   . PENILE PROSTHESIS IMPLANT N/A 01/24/2015   Procedure: EXPLANTATION OF PENILE PROSTHESIS;  Surgeon: Bernestine Amass, MD;  Location: [facility];  Service: Urology;  Laterality: N/A;    Family History  Problem Relation Age of Onset  . Hypertension Mother   . Diabetes Mother   . Stroke Father   . Cancer Father     colon  . Diabetes Sister   . Diabetes Sister   . Diabetes Brother    Family Status  Relation Status  . Mother Deceased  . Father Deceased  . Sister Alive  . Brother Alive  . Daughter Alive  . Son Alive  . Sister Alive  . Sister Alive  . Sister Alive  . Sister Alive  . Brother Alive  . Brother Alive  . Daughter Alive  . Son Alive  . Son Alive  . Sister   . Sister   . Brother     shoulder  Social History   Social History  . Marital status: Single    Spouse name: N/A  . Number of children: N/A  . Years of education: N/A   Occupational History  . Not on file.   Social History Main Topics  . Smoking status: Former Smoker    Years: 5.00    Quit date: 12/17/2013  . Smokeless tobacco: Never Used  . Alcohol use 0.0 oz/week     Comment: wine rare occ.  . Drug use: Yes    Types: Marijuana     Comment: 1-2 x per week.01-19-15 None advised prior to surgery.  . Sexual activity: Yes   Other Topics Concern  . Not on file   Social History Narrative  . No narrative on file    No Known Allergies  Allergies as of 01/16/2017   No  Known Allergies     Medication List       Accurate as of 01/16/17  4:34 PM. Always use your most recent med list.          aspirin  EC 325 MG tablet Take 1 tablet (325 mg total) by mouth daily.   atorvastatin 20 MG tablet Commonly known as:  LIPITOR Take 1 tablet (20 mg total) by mouth daily at 6 PM. For Cholesterol   bimatoprost 0.03 % ophthalmic solution Commonly known as:  LUMIGAN Place 1 drop into both eyes at bedtime.   clotrimazole-betamethasone cream Commonly known as:  LOTRISONE Apply 1 application topically 2 (two) times daily.   cyclobenzaprine 5 MG tablet Commonly known as:  FLEXERIL Take 1 tablet (5 mg total) by mouth 3 (three) times daily as needed for muscle spasms.   diazepam 5 MG tablet Commonly known as:  VALIUM Take 1 tablet (5 mg total) by mouth at bedtime.   DSS 100 MG Caps Take 100 mg by mouth 2 (two) times daily.   hydrochlorothiazide 50 MG tablet Commonly known as:  HYDRODIURIL Take 1 tablet (50 mg total) by mouth daily with breakfast.   Melatonin 5 MG Caps Take 1 capsule (5 mg total) by mouth at bedtime.   naloxegol oxalate 25 MG Tabs tablet Commonly known as:  MOVANTIK Take 1 tablet (25 mg total) by mouth daily.   oxyCODONE 15 MG immediate release tablet Commonly known as:  ROXICODONE Take 1 tablet (15 mg total) by mouth every 6 (six) hours as needed for pain.        Review of Systems:  Review of Systems  Unable to perform ROS: Psychiatric disorder    Physical Exam: Vitals:   01/16/17 1002  BP: 118/86  Pulse: 71  Temp: 98 F (36.7 C)  TempSrc: Oral  SpO2: 98%  Weight: 150 lb 3.2 oz (68.1 kg)  Height: _0  (1.803 m)   Body mass index is 20.95 kg/m. Physical Exam  Constitutional: He appears well-developed and well-nourished. No distress.  Frail appearing in NAD  HENT:  Head: Normocephalic and atraumatic.  Right Ear: Hearing, tympanic membrane, external ear and ear canal normal.  Left Ear: Hearing, tympanic  membrane, external ear and ear canal normal.  Mouth/Throat: Uvula is midline, oropharynx is clear and moist and mucous membranes are normal. He does not have dentures.  Eyes: Conjunctivae, EOM and lids are normal. Pupils are equal, round, and reactive to light. No scleral icterus.  Neck: Trachea normal and normal range of motion. Neck supple. Carotid bruit is not present. No thyroid mass and no thyromegaly present.  Cardiovascular: Normal rate, regular rhythm and intact distal pulses.  Exam reveals no gallop and no friction rub.   Murmur (1/6 SEM) heard. no distal LE swelling. No calf TTP  Pulmonary/Chest: Effort normal and breath sounds normal. He has no wheezes. He has no rhonchi. He has no rales. He exhibits no tenderness. Right breast exhibits no inverted nipple, no mass, no nipple discharge, no skin change and no tenderness. Left breast exhibits no inverted nipple, no mass, no nipple discharge, no skin change and no tenderness. Breasts are symmetrical.  Abdominal: Soft. Normal appearance, normal aorta and bowel sounds are normal. He exhibits no distension, no abdominal bruit, no pulsatile midline mass and no mass. There is no hepatosplenomegaly, splenomegaly or hepatomegaly. There is no tenderness. There is no rigidity, no rebound and no guarding. No hernia.  Musculoskeletal: He exhibits edema and tenderness.  Right hemiparesis. Gait unsteady. Uses cane  Lymphadenopathy:       Head (right side): No posterior auricular adenopathy present.       Head (left side): No posterior auricular adenopathy present.    He has no  cervical adenopathy.       Right: No supraclavicular adenopathy present.       Left: No supraclavicular adenopathy present.  Neurological: He is alert. He has normal strength and normal reflexes. No cranial nerve deficit. Gait normal.  Skin: Skin is warm, dry and intact. No rash noted. Nails show no clubbing.  Psychiatric: He has a normal mood and affect. His speech is normal and  behavior is normal. Thought content normal. Cognition and memory are normal.    Labs reviewed:  Basic Metabolic Panel:  Recent Labs  01/10/17 1052  NA 138  K 4.2  CL 106  CO2 25  GLUCOSE 93  BUN 17  CREATININE 1.09  CALCIUM 9.2  TSH 1.86   Liver Function Tests:  Recent Labs  01/10/17 1052  AST 23  ALT 16  ALKPHOS 65  BILITOT 0.5  PROT 7.8  ALBUMIN 4.1   No results for input(s): LIPASE, AMYLASE in the last 8760 hours. No results for input(s): AMMONIA in the last 8760 hours. CBC:  Recent Labs  01/10/17 1052  WBC 8.1  NEUTROABS 1,134*  HGB 14.5  HCT 42.3  MCV 89.4  PLT 197   Lipid Panel:  Recent Labs  01/10/17 1052  CHOL 157  HDL 50  LDLCALC 79  TRIG 142  CHOLHDL 3.1   Lab Results  Component Value Date   HGBA1C 5.5 12/05/2014    Procedures: No results found. ECG OBTAINED AND REVIEWED BY MYSELF:  SB @ 54 bpm, LAD, LAE, poor R wave progression. No acute ischemic changes. No change since Dec 2015  Assessment/Plan   ICD-9-CM ICD-10-CM   1. Well adult exam V70.0 Z00.00   2. Primary osteoarthritis involving multiple joints 715.09 M15.0   3. Essential hypertension 401.9 I10 EKG 12-Lead     CMP with eGFR  4. Bipolar affective disorder, current episode hypomanic (HCC) 296.40 F31.0 diazepam (VALIUM) 5 MG tablet     CMP with eGFR  5. Hemiparesis affecting right side as late effect of cerebrovascular accident ([place]) 438.20 I69.351   6. Mixed hyperlipidemia 272.2 E78.2 CMP with eGFR     Lipid Panel  7. History of stroke V12.54 Z86.73   8. Muscle spasticity 728.85 M62.838 diazepam (VALIUM) 5 MG tablet  9. High risk medication use V58.69 Z79.899 CMP with eGFR    Pt is UTD on health maintenance. Vaccinations are UTD. Pt maintains a healthy lifestyle. Encouraged pt to exercise 30-45 minutes 4-5 times per week. Eat a well balanced diet. Avoid smoking. Limit alcohol intake. Wear seatbelt when riding in the car. Wear sun block (SPF >50) when spending extended  times outside.  He reports he had colon cancer screening through [place] last month. Requested copy of results  Continue current medications as ordered  [facility]  Follow up in 4 mos for routine visit. Fasting labs prior to appt   [place] S. Perlie Gold  [facility] [address] [place], [place] [address] [phone] Cell (Monday-Friday 8 AM - 5 PM) [phone] After 5 PM and follow prompts

## 2017-02-06 ENCOUNTER — Telehealth: Payer: Self-pay | Admitting: Internal Medicine

## 2017-02-08 ENCOUNTER — Other Ambulatory Visit: Payer: Self-pay | Admitting: *Deleted

## 2017-02-08 MED ORDER — OXYCODONE HCL 15 MG PO TABS
15.0000 mg | ORAL_TABLET | Freq: Four times a day (QID) | ORAL | 0 refills | Status: DC | PRN
Start: 2017-02-08 — End: 2017-03-08

## 2017-02-08 NOTE — Telephone Encounter (Signed)
Patient requested and will pick up 

## 2017-02-08 NOTE — Telephone Encounter (Signed)
Original form given to patient in office and copy sent for scanning

## 2017-03-08 ENCOUNTER — Other Ambulatory Visit: Payer: Self-pay

## 2017-03-08 MED ORDER — OXYCODONE HCL 15 MG PO TABS: 15.0000 mg | ORAL_TABLET | Freq: Four times a day (QID) | ORAL | 0 refills | Status: DC | PRN

## 2017-03-08 NOTE — Telephone Encounter (Signed)
Patient informed rx signed and ready for pickup  

## 2017-03-08 NOTE — Telephone Encounter (Signed)
Patient requested and will pick up 

## 2017-04-08 ENCOUNTER — Other Ambulatory Visit: Payer: Self-pay | Admitting: *Deleted

## 2017-04-08 MED ORDER — OXYCODONE HCL 15 MG PO TABS: 15.0000 mg | ORAL_TABLET | Freq: Four times a day (QID) | ORAL | 0 refills | Status: DC | PRN

## 2017-04-08 NOTE — Telephone Encounter (Signed)
Patient requested and will pick up 

## 2017-05-04 ENCOUNTER — Emergency Department (HOSPITAL_COMMUNITY)
Admission: EM | Admit: 2017-05-04 | Discharge: 2017-05-04 | Disposition: A | Discharge status: Home / Self Care | Payer: Medicare Other | Attending: Dermatology | Admitting: Dermatology

## 2017-05-04 ENCOUNTER — Encounter (HOSPITAL_COMMUNITY): Payer: Self-pay | Admitting: Emergency Medicine

## 2017-05-04 DIAGNOSIS — Z5321 Procedure and treatment not carried out due to patient leaving prior to being seen by health care provider: Secondary | ICD-10-CM | POA: Diagnosis not present

## 2017-05-04 DIAGNOSIS — M542 Cervicalgia: Secondary | ICD-10-CM | POA: Diagnosis not present

## 2017-05-04 NOTE — ED Triage Notes (Signed)
Pt called,no answer.

## 2017-05-04 NOTE — ED Triage Notes (Signed)
Pt complaint of right neck pain worsening over past few days; pt denies injury.

## 2017-05-04 NOTE — ED Notes (Signed)
Pt called no answer x2 

## 2017-05-04 NOTE — ED Notes (Addendum)
Pt called no answer x3  

## 2017-05-08 ENCOUNTER — Other Ambulatory Visit: Payer: Self-pay | Admitting: *Deleted

## 2017-05-08 MED ORDER — OXYCODONE HCL 15 MG PO TABS: 15.0000 mg | ORAL_TABLET | Freq: Four times a day (QID) | ORAL | 0 refills | Status: DC | PRN

## 2017-05-08 NOTE — Telephone Encounter (Signed)
Patient requested and will pick up 

## 2017-05-15 ENCOUNTER — Other Ambulatory Visit: Payer: Medicare Other

## 2017-05-17 ENCOUNTER — Telehealth: Payer: Self-pay | Admitting: Internal Medicine

## 2017-05-17 ENCOUNTER — Ambulatory Visit: Payer: Medicare Other | Admitting: Internal Medicine

## 2017-05-17 NOTE — Telephone Encounter (Signed)
FYI, Patient called in this morning and wanted to check on his appointment he had scheduled for today (this appointment was canceled through the automated system) patient DID intend to cancel. He stated he has a lot going on right now. He said his wife left him and he has no help anymore. His condition does not allow him to do a lot of things for himself and he is really struggling. He became very emotional (crying) on the phone saying he needs help, he has so much going on right now, and is struggling with things including transportation. I encouraged the patient to speak with the doctor when he reschedules at his appointment to inquire about getting a referral to the [facility] for community resources for assistance for these things.

## 2017-05-17 NOTE — Telephone Encounter (Signed)
Noted  

## 2017-05-19 ENCOUNTER — Other Ambulatory Visit: Payer: Self-pay | Admitting: Internal Medicine

## 2017-06-07 ENCOUNTER — Other Ambulatory Visit: Payer: Self-pay | Admitting: *Deleted

## 2017-06-07 MED ORDER — OXYCODONE HCL 15 MG PO TABS: 15.0000 mg | ORAL_TABLET | Freq: Four times a day (QID) | ORAL | 0 refills | Status: DC | PRN

## 2017-06-07 NOTE — Telephone Encounter (Signed)
Patient requested and will pick up 

## 2017-06-20 ENCOUNTER — Other Ambulatory Visit: Payer: Medicare Other

## 2017-06-20 ENCOUNTER — Other Ambulatory Visit: Payer: Self-pay

## 2017-06-20 DIAGNOSIS — E782 Mixed hyperlipidemia: Secondary | ICD-10-CM | POA: Diagnosis not present

## 2017-06-20 DIAGNOSIS — I10 Essential (primary) hypertension: Secondary | ICD-10-CM

## 2017-06-20 LAB — COMPLETE METABOLIC PANEL WITH GFR
ALT: 21 U/L (ref 9–46)
AST: 27 U/L (ref 10–35)
Albumin: 4.2 g/dL (ref 3.6–5.1)
Alkaline Phosphatase: 68 U/L (ref 40–115)
BILIRUBIN TOTAL: 0.5 mg/dL (ref 0.2–1.2)
BUN: 25 mg/dL (ref 7–25)
CO2: 23 mmol/L (ref 20–31)
CREATININE: 1.07 mg/dL (ref 0.70–1.25)
Calcium: 9.2 mg/dL (ref 8.6–10.3)
Chloride: 105 mmol/L (ref 98–110)
GFR, Est African American: 87 mL/min (ref >=60)
GFR, Est Non African American: 75 mL/min (ref >=60)
GLUCOSE: 85 mg/dL (ref 65–99)
Potassium: 3.8 mmol/L (ref 3.5–5.3)
SODIUM: 139 mmol/L (ref 135–146)
TOTAL PROTEIN: 7.7 g/dL (ref 6.1–8.1)

## 2017-06-20 LAB — LIPID PANEL
CHOL/HDL RATIO: 3.3 ratio (ref <5.0)
Cholesterol: 182 mg/dL (ref <200)
HDL: 55 mg/dL (ref >40)
LDL CALC: 107 mg/dL — AB (ref <100)
Triglycerides: 101 mg/dL (ref <150)
VLDL: 20 mg/dL (ref <30)

## 2017-06-26 ENCOUNTER — Ambulatory Visit: Payer: Medicare Other | Admitting: Internal Medicine

## 2017-07-01 ENCOUNTER — Other Ambulatory Visit: Payer: Self-pay | Admitting: Internal Medicine

## 2017-07-03 ENCOUNTER — Telehealth: Payer: Self-pay | Admitting: *Deleted

## 2017-07-03 MED ORDER — OXYCODONE HCL 15 MG PO TABS: 15.0000 mg | ORAL_TABLET | Freq: Four times a day (QID) | ORAL | 0 refills | Status: DC | PRN

## 2017-07-03 NOTE — Telephone Encounter (Signed)
Rx printed and patient notified 

## 2017-07-03 NOTE — Telephone Encounter (Signed)
Ok to pick up early

## 2017-07-03 NOTE — Telephone Encounter (Signed)
Patient called and stated that he is going out of town tomorrow morning and wants to pick up his Oxycodone Rx today. Rx is due Friday but patient wants to pick it up early to go out of town. Is this ok?  Please Advise.

## 2017-08-02 ENCOUNTER — Other Ambulatory Visit: Payer: Self-pay | Admitting: *Deleted

## 2017-08-02 MED ORDER — OXYCODONE HCL 15 MG PO TABS: 15.0000 mg | ORAL_TABLET | Freq: Four times a day (QID) | ORAL | 0 refills | Status: DC | PRN

## 2017-08-02 NOTE — Telephone Encounter (Signed)
Patient requested and will pick up [place] Database Checked.  

## 2017-08-06 ENCOUNTER — Other Ambulatory Visit: Payer: Self-pay | Admitting: Internal Medicine

## 2017-08-16 ENCOUNTER — Other Ambulatory Visit: Payer: Self-pay | Admitting: Internal Medicine

## 2017-08-29 ENCOUNTER — Other Ambulatory Visit: Payer: Self-pay | Admitting: *Deleted

## 2017-08-29 MED ORDER — OXYCODONE HCL 15 MG PO TABS: 15.0000 mg | ORAL_TABLET | Freq: Four times a day (QID) | ORAL | 0 refills | Status: DC | PRN

## 2017-08-29 NOTE — Telephone Encounter (Signed)
Patient requested and will pick up [place] Database Checked.  

## 2017-09-20 LAB — CBC AND DIFFERENTIAL
HCT: 44 (ref 41–53)
Hemoglobin: 15.2 (ref 13.5–17.5)
Platelets: 180 (ref 150–399)
WBC: 8.1

## 2017-09-27 ENCOUNTER — Other Ambulatory Visit: Payer: Self-pay | Admitting: *Deleted

## 2017-09-27 NOTE — Telephone Encounter (Signed)
Patient requested and will pick up [place] Database not checked due to Power Outage Hand Written Rx provided. Nathan Buchanan Signed.

## 2017-10-21 ENCOUNTER — Other Ambulatory Visit: Payer: Self-pay | Admitting: Internal Medicine

## 2017-10-21 ENCOUNTER — Telehealth: Payer: Self-pay

## 2017-10-21 NOTE — Telephone Encounter (Signed)
Patient called for refill on Oxycodone, last refill 09/27/17, has appt with Dr Montez Moritaarter on 10/23/17, you can talk with her then.

## 2017-10-22 ENCOUNTER — Telehealth: Payer: Self-pay

## 2017-10-22 NOTE — Telephone Encounter (Signed)
Per Nathan Cellaorothy patient called to reschedule appointment and denied cancelling via automatic system

## 2017-10-22 NOTE — Telephone Encounter (Signed)
FYI, patient cancelled appointment for tomorrow via automated system, I informed patient when he called yesterday to cancel appointment that he can not continue to cancel appointments and received controlled substance refills. Patient verbalized understanding at the time and stated never mind I will keep appointment for Wednesday 10/23/17, yet cancelled today.  Cancelled 10/23/17  Patient no-showed 06/26/17 Cancelled 05/17/17 Cancelled 05/15/17  Please advise if ok to release next refill for Oxycodone due 10/28/17 (patient was trying to get early, I told him yesterday to discuss with you tomorrow, yet he has cancelled that appointment)

## 2017-10-22 NOTE — Telephone Encounter (Signed)
No follow up in office means NO Rx

## 2017-10-23 ENCOUNTER — Ambulatory Visit: Payer: Medicare Other | Admitting: Internal Medicine

## 2017-10-23 ENCOUNTER — Ambulatory Visit (INDEPENDENT_AMBULATORY_CARE_PROVIDER_SITE_OTHER): Payer: Medicare Other | Admitting: Internal Medicine

## 2017-10-23 ENCOUNTER — Encounter: Payer: Self-pay | Admitting: Internal Medicine

## 2017-10-23 VITALS — BP 122/82 | HR 72 | Temp 97.7°F | Resp 14 | Ht 71.0 in | Wt 155.0 lb

## 2017-10-23 DIAGNOSIS — Z8673 Personal history of transient ischemic attack (TIA), and cerebral infarction without residual deficits: Secondary | ICD-10-CM

## 2017-10-23 DIAGNOSIS — M150 Primary generalized (osteo)arthritis: Secondary | ICD-10-CM

## 2017-10-23 DIAGNOSIS — I69351 Hemiplegia and hemiparesis following cerebral infarction affecting right dominant side: Secondary | ICD-10-CM

## 2017-10-23 DIAGNOSIS — Z125 Encounter for screening for malignant neoplasm of prostate: Secondary | ICD-10-CM | POA: Diagnosis not present

## 2017-10-23 DIAGNOSIS — Z79899 Other long term (current) drug therapy: Secondary | ICD-10-CM | POA: Diagnosis not present

## 2017-10-23 DIAGNOSIS — F310 Bipolar disorder, current episode hypomanic: Secondary | ICD-10-CM | POA: Diagnosis not present

## 2017-10-23 DIAGNOSIS — I10 Essential (primary) hypertension: Secondary | ICD-10-CM | POA: Diagnosis not present

## 2017-10-23 DIAGNOSIS — E782 Mixed hyperlipidemia: Secondary | ICD-10-CM | POA: Diagnosis not present

## 2017-10-23 DIAGNOSIS — L309 Dermatitis, unspecified: Secondary | ICD-10-CM

## 2017-10-23 DIAGNOSIS — M159 Polyosteoarthritis, unspecified: Secondary | ICD-10-CM

## 2017-10-23 MED ORDER — CLOTRIMAZOLE-BETAMETHASONE 1-0.05 % EX CREA: TOPICAL_CREAM | CUTANEOUS | 1 refills | Status: DC

## 2017-10-23 MED ORDER — ZOSTER VAC RECOMB ADJUVANTED 50 MCG/0.5ML IM SUSR: 0.5000 mL | Freq: Once | INTRAMUSCULAR | 1 refills | Status: AC
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
## 2017-10-23 NOTE — Patient Instructions (Addendum)
May pick up pain script on Friday Nov 9th  Continue current medications as ordered  Follow up with specialists as scheduled  [facility] as scheduled  Follow up in 3 mos for CPE/AWV. Fasting labs prior to appt.

## 2017-10-24 ENCOUNTER — Encounter: Payer: Self-pay | Admitting: Internal Medicine

## 2017-10-24 DIAGNOSIS — I69351 Hemiplegia and hemiparesis following cerebral infarction affecting right dominant side: Secondary | ICD-10-CM | POA: Insufficient documentation

## 2017-10-24 DIAGNOSIS — Z79899 Other long term (current) drug therapy: Secondary | ICD-10-CM | POA: Insufficient documentation

## 2017-10-24 DIAGNOSIS — Z8673 Personal history of transient ischemic attack (TIA), and cerebral infarction without residual deficits: Secondary | ICD-10-CM | POA: Insufficient documentation

## 2017-10-24 LAB — CBC AND DIFFERENTIAL
HCT: 41 (ref 41–53)
Hemoglobin: 14.4 (ref 13.5–17.5)
Neutrophils Absolute: 16
Platelets: 204 (ref 150–399)
WBC: 9.7

## 2017-10-25 ENCOUNTER — Other Ambulatory Visit: Payer: Self-pay

## 2017-10-25 ENCOUNTER — Other Ambulatory Visit: Payer: Medicare Other

## 2017-10-25 DIAGNOSIS — F1190 Opioid use, unspecified, uncomplicated: Secondary | ICD-10-CM

## 2017-10-25 DIAGNOSIS — Z79899 Other long term (current) drug therapy: Secondary | ICD-10-CM | POA: Diagnosis not present

## 2017-10-25 MED ORDER — OXYCODONE HCL 15 MG PO TABS: 15.0000 mg | ORAL_TABLET | Freq: Four times a day (QID) | ORAL | 0 refills | Status: DC | PRN

## 2017-10-25 NOTE — Telephone Encounter (Signed)
[place] Database verified and compliance confirmed   Patient is due for a drug screen per Dr.Carter, order placed

## 2017-10-31 LAB — DRUG TOX MONITOR 1 W/CONF, ORAL FLD
Amphetamines: NEGATIVE ng/mL
Barbiturates: NEGATIVE ng/mL
Benzodiazepines: NEGATIVE ng/mL
Buprenorphine: NEGATIVE ng/mL
Cocaine: NEGATIVE ng/mL
Cotinine: 15.5 ng/mL — ABNORMAL HIGH
Fentanyl: NEGATIVE ng/mL
Heroin Metabolite: NEGATIVE ng/mL
MARIJUANA: POSITIVE ng/mL — AB
MDMA: NEGATIVE ng/mL
Meprobamate: NEGATIVE ng/mL
Methadone: NEGATIVE ng/mL
Nicotine Metabolite: POSITIVE ng/mL — AB
Opiates: NEGATIVE ng/mL
Phencyclidine: NEGATIVE ng/mL
THC: >250 ng/mL — ABNORMAL HIGH
Tapentadol: NEGATIVE ng/mL
Tramadol: NEGATIVE ng/mL
Zolpidem: NEGATIVE ng/mL

## 2017-11-04 ENCOUNTER — Other Ambulatory Visit: Payer: Self-pay

## 2017-11-04 DIAGNOSIS — M5440 Lumbago with sciatica, unspecified side: Principal | ICD-10-CM

## 2017-11-04 DIAGNOSIS — Z79899 Other long term (current) drug therapy: Secondary | ICD-10-CM

## 2017-11-04 DIAGNOSIS — G8929 Other chronic pain: Secondary | ICD-10-CM

## 2017-11-04 DIAGNOSIS — M159 Polyosteoarthritis, unspecified: Secondary | ICD-10-CM

## 2017-11-04 DIAGNOSIS — M150 Primary generalized (osteo)arthritis: Secondary | ICD-10-CM

## 2017-11-19 DIAGNOSIS — G894 Chronic pain syndrome: Secondary | ICD-10-CM | POA: Diagnosis not present

## 2017-11-19 DIAGNOSIS — Z131 Encounter for screening for diabetes mellitus: Secondary | ICD-10-CM | POA: Diagnosis not present

## 2017-11-19 DIAGNOSIS — M545 Low back pain: Secondary | ICD-10-CM | POA: Diagnosis not present

## 2017-11-19 DIAGNOSIS — I10 Essential (primary) hypertension: Secondary | ICD-10-CM | POA: Diagnosis not present

## 2017-11-19 DIAGNOSIS — E785 Hyperlipidemia, unspecified: Secondary | ICD-10-CM | POA: Diagnosis not present

## 2017-11-19 DIAGNOSIS — Z720 Tobacco use: Secondary | ICD-10-CM | POA: Diagnosis not present

## 2017-11-19 DIAGNOSIS — Z5181 Encounter for therapeutic drug level monitoring: Secondary | ICD-10-CM | POA: Diagnosis not present

## 2017-12-11 DIAGNOSIS — G8929 Other chronic pain: Secondary | ICD-10-CM | POA: Diagnosis not present

## 2017-12-11 DIAGNOSIS — M545 Low back pain: Secondary | ICD-10-CM | POA: Diagnosis not present

## 2017-12-11 DIAGNOSIS — Z79891 Long term (current) use of opiate analgesic: Secondary | ICD-10-CM | POA: Diagnosis not present

## 2017-12-11 DIAGNOSIS — G894 Chronic pain syndrome: Secondary | ICD-10-CM | POA: Diagnosis not present

## 2017-12-11 DIAGNOSIS — M25562 Pain in left knee: Secondary | ICD-10-CM | POA: Diagnosis not present

## 2017-12-11 DIAGNOSIS — M25561 Pain in right knee: Secondary | ICD-10-CM | POA: Diagnosis not present

## 2017-12-11 DIAGNOSIS — M2021 Hallux rigidus, right foot: Secondary | ICD-10-CM | POA: Diagnosis not present

## 2017-12-11 DIAGNOSIS — G541 Lumbosacral plexus disorders: Secondary | ICD-10-CM | POA: Diagnosis not present

## 2017-12-25 DIAGNOSIS — M25551 Pain in right hip: Secondary | ICD-10-CM | POA: Diagnosis not present

## 2017-12-25 DIAGNOSIS — G8929 Other chronic pain: Secondary | ICD-10-CM | POA: Diagnosis not present

## 2017-12-25 DIAGNOSIS — Z79891 Long term (current) use of opiate analgesic: Secondary | ICD-10-CM | POA: Diagnosis not present

## 2017-12-25 DIAGNOSIS — G894 Chronic pain syndrome: Secondary | ICD-10-CM | POA: Diagnosis not present

## 2017-12-25 DIAGNOSIS — M25511 Pain in right shoulder: Secondary | ICD-10-CM | POA: Diagnosis not present

## 2017-12-25 DIAGNOSIS — M25552 Pain in left hip: Secondary | ICD-10-CM | POA: Diagnosis not present

## 2017-12-25 DIAGNOSIS — M545 Low back pain: Secondary | ICD-10-CM | POA: Diagnosis not present

## 2018-01-21 ENCOUNTER — Other Ambulatory Visit: Payer: Medicare Other

## 2018-01-21 ENCOUNTER — Ambulatory Visit: Payer: Medicare Other

## 2018-01-22 ENCOUNTER — Encounter: Payer: Medicare Other | Admitting: Internal Medicine

## 2018-02-11 ENCOUNTER — Encounter: Payer: Self-pay | Admitting: Internal Medicine

## 2018-02-17 ENCOUNTER — Ambulatory Visit: Payer: Medicare Other

## 2018-03-11 ENCOUNTER — Telehealth: Payer: Self-pay | Admitting: Internal Medicine

## 2018-03-11 NOTE — Telephone Encounter (Signed)
I called the pt to reschedule his AWV that was cancelled in March.  There was no answer, and the voicemail was full. VDM (DD)

## 2018-04-11 ENCOUNTER — Encounter: Payer: Self-pay | Admitting: Internal Medicine

## 2018-04-22 ENCOUNTER — Encounter (HOSPITAL_COMMUNITY): Payer: Self-pay

## 2018-04-22 ENCOUNTER — Emergency Department (HOSPITAL_COMMUNITY)
Admission: EM | Admit: 2018-04-22 | Discharge: 2018-04-22 | Disposition: A | Discharge status: Home / Self Care | Payer: Medicare Other | Attending: Emergency Medicine | Admitting: Emergency Medicine

## 2018-04-22 ENCOUNTER — Emergency Department (HOSPITAL_COMMUNITY): Payer: Medicare Other

## 2018-04-22 DIAGNOSIS — Z7982 Long term (current) use of aspirin: Secondary | ICD-10-CM | POA: Insufficient documentation

## 2018-04-22 DIAGNOSIS — Z87891 Personal history of nicotine dependence: Secondary | ICD-10-CM | POA: Insufficient documentation

## 2018-04-22 DIAGNOSIS — I10 Essential (primary) hypertension: Secondary | ICD-10-CM | POA: Insufficient documentation

## 2018-04-22 DIAGNOSIS — Z79899 Other long term (current) drug therapy: Secondary | ICD-10-CM | POA: Insufficient documentation

## 2018-04-22 DIAGNOSIS — F1210 Cannabis abuse, uncomplicated: Secondary | ICD-10-CM | POA: Insufficient documentation

## 2018-04-22 DIAGNOSIS — F319 Bipolar disorder, unspecified: Secondary | ICD-10-CM | POA: Diagnosis not present

## 2018-04-22 DIAGNOSIS — M542 Cervicalgia: Secondary | ICD-10-CM | POA: Diagnosis not present

## 2018-04-22 LAB — I-STAT CHEM 8, ED
BUN: 13 mg/dL (ref 6–20)
CALCIUM ION: 1 mmol/L — AB (ref 1.15–1.40)
CREATININE: 0.8 mg/dL (ref 0.61–1.24)
Chloride: 108 mmol/L (ref 101–111)
Glucose, Bld: 96 mg/dL (ref 65–99)
HCT: 45 % (ref 39.0–52.0)
Hemoglobin: 15.3 g/dL (ref 13.0–17.0)
Potassium: 3.9 mmol/L (ref 3.5–5.1)
Sodium: 140 mmol/L (ref 135–145)
TCO2: 24 mmol/L (ref 22–32)

## 2018-04-22 MED ORDER — DIAZEPAM 5 MG PO TABS
5.0000 mg | ORAL_TABLET | Freq: Once | ORAL | Status: AC
  Administered 2018-04-22: 5 mg via ORAL
  Filled 2018-04-22: qty 1

## 2018-04-22 MED ORDER — ACETAMINOPHEN 325 MG PO TABS
650.0000 mg | ORAL_TABLET | Freq: Once | ORAL | Status: AC
  Administered 2018-04-22: 650 mg via ORAL
  Filled 2018-04-22: qty 2

## 2018-04-22 MED ORDER — NAPROXEN 375 MG PO TABS: 375.0000 mg | ORAL_TABLET | Freq: Two times a day (BID) | ORAL | 0 refills | Status: DC

## 2018-04-22 MED ORDER — KETOROLAC TROMETHAMINE 30 MG/ML IJ SOLN
15.0000 mg | Freq: Once | INTRAMUSCULAR | Status: AC
Start: 2018-04-22 — End: 2018-04-22
  Administered 2018-04-22: 15 mg via INTRAMUSCULAR
  Filled 2018-04-22: qty 1

## 2018-04-22 MED ORDER — DIAZEPAM 2 MG PO TABS: 2.0000 mg | ORAL_TABLET | Freq: Two times a day (BID) | ORAL | 0 refills | Status: DC
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
## 2018-04-22 NOTE — Discharge Instructions (Addendum)
Your symptoms seem consistent with a neck spasm.  Please take the Naproxen as prescribed for pain. Do not take any additional NSAIDs including Motrin, Aleve, Ibuprofen, Advil. Do not take any additional today as this was given to you in the Ed. Please the the valium for muscle relaxation. This medication will make you drowsy so avoid situation that could place you in danger.  Apply heat to the area.  Warm soaks and Epson salt.  Follow-up with your primary care doctor.  If you develop any paresthesias, weakness or further pain return the ED immediately.

## 2018-04-22 NOTE — ED Triage Notes (Signed)
Pt complains of neck pain for two days, he was having a hard time walking into triage, it appeared as his neck was spasming

## 2018-04-22 NOTE — ED Notes (Signed)
Bed: [facility] Expected date:  Expected time:  Means of arrival:  Comments: 

## 2018-05-21 ENCOUNTER — Encounter: Payer: Medicare Other | Admitting: Internal Medicine

## 2018-05-21 ENCOUNTER — Ambulatory Visit: Payer: Medicare Other

## 2018-07-14 ENCOUNTER — Other Ambulatory Visit: Payer: Self-pay

## 2018-07-14 ENCOUNTER — Ambulatory Visit (INDEPENDENT_AMBULATORY_CARE_PROVIDER_SITE_OTHER): Payer: Medicare Other

## 2018-07-14 VITALS — BP 138/64 | HR 80 | Temp 97.2°F | Ht 71.0 in | Wt 149.0 lb

## 2018-07-14 DIAGNOSIS — Z0000 Encounter for general adult medical examination without abnormal findings: Secondary | ICD-10-CM | POA: Diagnosis not present

## 2018-07-14 DIAGNOSIS — I10 Essential (primary) hypertension: Secondary | ICD-10-CM

## 2018-07-14 DIAGNOSIS — Z598 Other problems related to housing and economic circumstances: Secondary | ICD-10-CM | POA: Diagnosis not present

## 2018-07-14 DIAGNOSIS — Z79899 Other long term (current) drug therapy: Secondary | ICD-10-CM

## 2018-07-14 DIAGNOSIS — Z599 Problem related to housing and economic circumstances, unspecified: Secondary | ICD-10-CM

## 2018-07-14 DIAGNOSIS — E782 Mixed hyperlipidemia: Secondary | ICD-10-CM

## 2018-07-14 DIAGNOSIS — Z125 Encounter for screening for malignant neoplasm of prostate: Secondary | ICD-10-CM | POA: Diagnosis not present

## 2018-07-14 MED ORDER — CLOTRIMAZOLE-BETAMETHASONE 1-0.05 % EX CREA: TOPICAL_CREAM | CUTANEOUS | 1 refills | Status: AC

## 2018-07-14 NOTE — Patient Instructions (Signed)
Mr. Nathan Buchanan , Thank you for taking time to come for your Medicare Wellness Visit. I appreciate your ongoing commitment to your health goals. Please review the following plan we discussed and let me know if I can assist you in the future.   Screening recommendations/referrals: Colonoscopy up to date, due 12/17/2018 Recommended yearly ophthalmology/optometry visit for glaucoma screening and checkup Recommended yearly dental visit for hygiene and checkup  Vaccinations: Influenza vaccine due 2019 fall season Pneumococcal vaccine due age 62 Tdap vaccine up to date, due 10/22/2022 Shingles vaccine second shot due. Please call [facility] and get the second shot    Advanced directives: Advance directive discussed with you today. I have provided a copy for you to complete at home and have notarized. Once this is complete please bring a copy in to our office so we can scan it into your chart.  Conditions/risks identified: none  Next appointment: 07/17/2019 @ 9:15am  Preventive Care 40-64 Years, Male Preventive care refers to lifestyle choices and visits with your health care provider that can promote health and wellness. What does preventive care include?  A yearly physical exam. This is also called an annual well check.  Dental exams once or twice a year.  Routine eye exams. Ask your health care provider how often you should have your eyes checked.  Personal lifestyle choices, including:  Daily care of your teeth and gums.  Regular physical activity.  Eating a healthy diet.  Avoiding tobacco and drug use.  Limiting alcohol use.  Practicing safe sex.  Taking low-dose aspirin every day starting at age 62. What happens during an annual well check? The services and screenings done by your health care provider during your annual well check will depend on your age, overall health, lifestyle risk factors, and family history of disease. Counseling  Your health care provider may ask you  questions about your:  Alcohol use.  Tobacco use.  Drug use.  Emotional well-being.  Home and relationship well-being.  Sexual activity.  Eating habits.  Work and work Astronomerenvironment. Screening  You may have the following tests or measurements:  Height, weight, and BMI.  Blood pressure.  Lipid and cholesterol levels. These may be checked every 5 years, or more frequently if you are over 62 years old.  Skin check.  Lung cancer screening. You may have this screening every year starting at age 62 if you have a 30-pack-year history of smoking and currently smoke or have quit within the past 15 years.  Fecal occult blood test (FOBT) of the stool. You may have this test every year starting at age 62.  Flexible sigmoidoscopy or colonoscopy. You may have a sigmoidoscopy every 5 years or a colonoscopy every 10 years starting at age 62.  Prostate cancer screening. Recommendations will vary depending on your family history and other risks.  Hepatitis C blood test.  Hepatitis B blood test.  Sexually transmitted disease (STD) testing.  Diabetes screening. This is done by checking your blood sugar (glucose) after you have not eaten for a while (fasting). You may have this done every 1-3 years. Discuss your test results, treatment options, and if necessary, the need for more tests with your health care provider. Vaccines  Your health care provider may recommend certain vaccines, such as:  Influenza vaccine. This is recommended every year.  Tetanus, diphtheria, and acellular pertussis (Tdap, Td) vaccine. You may need a Td booster every 10 years.  Zoster vaccine. You may need this after age 360.  Pneumococcal  13-valent conjugate (PCV13) vaccine. You may need this if you have certain conditions and have not been vaccinated.  Pneumococcal polysaccharide (PPSV23) vaccine. You may need one or two doses if you smoke cigarettes or if you have certain conditions. Talk to your health care  provider about which screenings and vaccines you need and how often you need them. This information is not intended to replace advice given to you by your health care provider. Make sure you discuss any questions you have with your health care provider. Document Released: 12/30/2015 Document Revised: 08/22/2016 Document Reviewed: 10/04/2015 Elsevier Interactive Patient Education  2017 [employer] Prevention in the Home Falls can cause injuries. They can happen to people of all ages. There are many things you can do to make your home safe and to help prevent falls. What can I do on the outside of my home?  Regularly fix the edges of walkways and driveways and fix any cracks.  Remove anything that might make you trip as you walk through a door, such as a raised step or threshold.  Trim any bushes or trees on the path to your home.  Use bright outdoor lighting.  Clear any walking paths of anything that might make someone trip, such as rocks or tools.  Regularly check to see if handrails are loose or broken. Make sure that both sides of any steps have handrails.  Any raised decks and porches should have guardrails on the edges.  Have any leaves, snow, or ice cleared regularly.  Use sand or salt on walking paths during winter.  Clean up any spills in your garage right away. This includes oil or grease spills. What can I do in the bathroom?  Use night lights.  Install grab bars by the toilet and in the tub and shower. Do not use towel bars as grab bars.  Use non-skid mats or decals in the tub or shower.  If you need to sit down in the shower, use a plastic, non-slip stool.  Keep the floor dry. Clean up any water that spills on the floor as soon as it happens.  Remove soap buildup in the tub or shower regularly.  Attach bath mats securely with double-sided non-slip rug tape.  Do not have throw rugs and other things on the floor that can make you trip. What can I do in  the bedroom?  Use night lights.  Make sure that you have a light by your bed that is easy to reach.  Do not use any sheets or blankets that are too big for your bed. They should not hang down onto the floor.  Have a firm chair that has side arms. You can use this for support while you get dressed.  Do not have throw rugs and other things on the floor that can make you trip. What can I do in the kitchen?  Clean up any spills right away.  Avoid walking on wet floors.  Keep items that you use a lot in easy-to-reach places.  If you need to reach something above you, use a strong step stool that has a grab bar.  Keep electrical cords out of the way.  Do not use floor polish or wax that makes floors slippery. If you must use wax, use non-skid floor wax.  Do not have throw rugs and other things on the floor that can make you trip. What can I do with my stairs?  Do not leave any items on the stairs.  Make sure that there are handrails on both sides of the stairs and use them. Fix handrails that are broken or loose. Make sure that handrails are as long as the stairways.  Check any carpeting to make sure that it is firmly attached to the stairs. Fix any carpet that is loose or worn.  Avoid having throw rugs at the top or bottom of the stairs. If you do have throw rugs, attach them to the floor with carpet tape.  Make sure that you have a light switch at the top of the stairs and the bottom of the stairs. If you do not have them, ask someone to add them for you. What else can I do to help prevent falls?  Wear shoes that:  Do not have high heels.  Have rubber bottoms.  Are comfortable and fit you well.  Are closed at the toe. Do not wear sandals.  If you use a stepladder:  Make sure that it is fully opened. Do not climb a closed stepladder.  Make sure that both sides of the stepladder are locked into place.  Ask someone to hold it for you, if possible.  Clearly mark and  make sure that you can see:  Any grab bars or handrails.  First and last steps.  Where the edge of each step is.  Use tools that help you move around (mobility aids) if they are needed. These include:  Canes.  Walkers.  Scooters.  Crutches.  Turn on the lights when you go into a dark area. Replace any light bulbs as soon as they burn out.  Set up your furniture so you have a clear path. Avoid moving your furniture around.  If any of your floors are uneven, fix them.  If there are any pets around you, be aware of where they are.  Review your medicines with your doctor. Some medicines can make you feel dizzy. This can increase your chance of falling. Ask your doctor what other things that you can do to help prevent falls. This information is not intended to replace advice given to you by your health care provider. Make sure you discuss any questions you have with your health care provider. Document Released: 09/29/2009 Document Revised: 05/10/2016 Document Reviewed: 01/07/2015 Elsevier Interactive Patient Education  2017 [employer].

## 2018-07-14 NOTE — Progress Notes (Signed)
Subjective:   Nathan Buchanan is a 62 y.o. male who presents for Medicare Annual/Subsequent preventive examination   Last AWV-01/10/2017    Objective:    Vitals: BP 138/64 (BP Location: Left Arm, Patient Position: Sitting)   Pulse 80   Temp (!) 97.2 F (36.2 C) (Oral)   Ht 5\' 11"  (1.803 m)   Wt 149 lb (67.6 kg)   SpO2 98%   BMI 20.78 kg/m   Body mass index is 20.78 kg/m.  Advanced Directives 07/14/2018 05/04/2017 01/16/2017 01/10/2017 09/12/2016 04/11/2016 12/09/2015  Does Patient Have a Medical Advance Directive? No No No No No No No  Would patient like information on creating a medical advance directive? Yes (MAU/Ambulatory/Procedural Areas - Information given) - No - Patient declined No - Patient declined No - patient declined information No - patient declined information No - patient declined information    Tobacco Social History   Tobacco Use  Smoking Status Former Smoker  . Years: 5.00  . Last attempt to quit: 12/17/2013  . Years since quitting: 4.5  Smokeless Tobacco Never Used     Counseling given: Not Answered   Clinical Intake:  Pre-visit preparation completed: No  Pain : No/denies pain     Nutritional Risks: None Diabetes: No  How often do you need to have someone help you when you read instructions, pamphlets, or other written materials from your doctor or pharmacy?: 2 - Rarely What is the last grade level you completed in school?: 11th  Interpreter Needed?: No  Information entered by :: Tyron Russell, RN  Past Medical History:  Diagnosis Date  . Arthritis   . Back ache   . Bipolar I disorder, most recent episode (or current) unspecified    no meds at present  . Chronic hepatitis C without mention of hepatic coma    tx. with meds/ was told" was cured"  . Cirrhosis of liver without mention of alcohol   . Dermatophytosis of the body   . Generalized osteoarthrosis, involving multiple sites   . Hyperlipidemia   . Hypertension   . Osteoarthrosis,  unspecified whether generalized or localized, unspecified site   . Stroke [facility])     12'15 -Right sided weakness remains  . Unspecified glaucoma(365.9)   . Urinary frequency    Past Surgical History:  Procedure Laterality Date  . ABCESS DRAINAGE     Gluteal MRSA drainage   . ANAL FISSURE REPAIR  2008  . COLONOSCOPY  2008   Dr.Edwards, Internal Hemorrhoids   . CYSTOSCOPY N/A 01/24/2015   Procedure: CYSTOSCOPY FLEXIBLE;  Surgeon: Valetta Fuller, MD;  Location: [facility];  Service: Urology;  Laterality: N/A;  . PENILE PROSTHESIS IMPLANT  04/12/2010   Dr.Grapey, Insertion of 3 peice penile prosthesis   . PENILE PROSTHESIS IMPLANT N/A 01/24/2015   Procedure: EXPLANTATION OF PENILE PROSTHESIS;  Surgeon: Valetta Fuller, MD;  Location: [facility];  Service: Urology;  Laterality: N/A;   Family History  Problem Relation Age of Onset  . Hypertension Mother   . Diabetes Mother   . Stroke Father   . Cancer Father        colon  . Diabetes Sister   . Diabetes Sister   . Diabetes Brother    Social History   Socioeconomic History  . Marital status: Married    Spouse name: Not on file  . Number of children: Not on file  . Years of education: Not on file  . Highest education level: Not on file  Occupational History  . Not on file  Social Needs  . Financial resource strain: Not hard at all  . Food insecurity:    Worry: Never true    Inability: Never true  . Transportation needs:    Medical: No    Non-medical: No  Tobacco Use  . Smoking status: Former Smoker    Years: 5.00    Last attempt to quit: 12/17/2013    Years since quitting: 4.5  . Smokeless tobacco: Never Used  Substance and Sexual Activity  . Alcohol use: Yes    Alcohol/week: 0.0 oz    Comment: wine rare occ.  . Drug use: Yes    Types: Marijuana    Comment: a joint a day to ease pain  . Sexual activity: Yes  Lifestyle  . Physical activity:    Days per week: 0 days    Minutes per session: 0 min  . Stress: Only a little    Relationships  . Social connections:    Talks on phone: More than three times a week    Gets together: Never    Attends religious service: More than 4 times per year    Active member of club or organization: No    Attends meetings of clubs or organizations: Never    Relationship status: Married  Other Topics Concern  . Not on file  Social History Narrative  . Not on file    Outpatient Encounter Medications as of 07/14/2018  Medication Sig  . aspirin EC 325 MG tablet Take 1 tablet (325 mg total) by mouth daily.  Marland Kitchen atorvastatin (LIPITOR) 20 MG tablet Take 1 tablet (20 mg total) by mouth daily at 6 PM. For Cholesterol  . bimatoprost (LUMIGAN) 0.03 % ophthalmic solution Place 1 drop into both eyes at bedtime.  . hydrochlorothiazide (HYDRODIURIL) 50 MG tablet Take 1 tablet (50 mg total) by mouth daily with breakfast.  . clotrimazole-betamethasone (LOTRISONE) cream apply to affected area twice a day (Patient not taking: Reported on 04/22/2018)  . cyclobenzaprine (FLEXERIL) 5 MG tablet Take 1 tablet (5 mg total) by mouth 3 (three) times daily as needed for muscle spasms. (Patient not taking: Reported on 07/14/2018)  . diazepam (VALIUM) 2 MG tablet Take 1 tablet (2 mg total) by mouth 2 (two) times daily. (Patient not taking: Reported on 07/14/2018)  . Docusate Sodium (DSS) 100 MG CAPS Take 100 mg by mouth 2 (two) times daily. (Patient not taking: Reported on 07/14/2018)  . Melatonin 5 MG CAPS Take 1 capsule (5 mg total) by mouth at bedtime. (Patient not taking: Reported on 07/14/2018)  . Naloxegol Oxalate (MOVANTIK) 25 MG TABS Take 1 tablet (25 mg total) by mouth daily. (Patient not taking: Reported on 07/14/2018)  . naproxen (NAPROSYN) 375 MG tablet Take 1 tablet (375 mg total) by mouth 2 (two) times daily. (Patient not taking: Reported on 07/14/2018)  . oxyCODONE (ROXICODONE) 15 MG immediate release tablet Take 1 tablet (15 mg total) every 6 (six) hours as needed by mouth for pain. (Patient not taking:  Reported on 07/14/2018)   No facility-administered encounter medications on file as of 07/14/2018.     Activities of Daily Living In your present state of health, do you have any difficulty performing the following activities: 07/14/2018  Hearing? N  Vision? N  Difficulty concentrating or making decisions? N  Walking or climbing stairs? Y  Dressing or bathing? N  Doing errands, shopping? N  Preparing Food and eating ? N  Using the Toilet? N  In the past six months, have you accidently leaked urine? N  Do you have problems with loss of bowel control? N  Managing your Medications? N  Managing your Finances? N  Housekeeping or managing your Housekeeping? N  Some recent data might be hidden    Patient Care Team: Kirt Boys, DO as PCP - General (Internal Medicine) Marvel Plan, MD as Consulting Physician (Neurology)   Assessment:   This is a routine wellness examination for Jaxn.  Exercise Activities and Dietary recommendations Current Exercise Habits: The patient does not participate in regular exercise at present, Exercise limited by: Other - see comments(pain)  Goals    None      Fall Risk Fall Risk  07/14/2018 01/16/2017 01/10/2017 09/12/2016 12/09/2015  Falls in the past year? Yes No No Yes No  Number falls in past yr: 1 - - 1 -  Injury with Fall? Yes - - Yes -  Comment R knee - - - -   Is the patient's home free of loose throw rugs in walkways, pet beds, electrical cords, etc?   yes      Grab bars in the bathroom? no      Handrails on the stairs?   yes      Adequate lighting?   yes  Timed Get Up and Go Performed: 25 seconds, fall risk  Depression Screen PHQ 2/9 Scores 07/14/2018 01/10/2017 09/12/2016 09/09/2015  PHQ - 2 Score 0 0 4 0  PHQ- 9 Score - - - -    Cognitive Function within normal function MMSE - Mini Mental State Exam 01/10/2017  Orientation to time 5  Orientation to Place 5  Registration 3  Attention/ Calculation 3  Recall 3  Language- name 2  objects 2  Language- repeat 1  Language- follow 3 step command 3  Language- read & follow direction 1  Write a sentence 0  Copy design 0  Total score 26        Immunization History  Administered Date(s) Administered  . Influenza Whole 10/17/2006  . Influenza,inj,Quad PF,6+ Mos 09/12/2016  . Influenza-Unspecified 08/21/2013, 11/16/2014, 07/29/2015, 08/17/2017  . Pneumococcal-Unspecified 11/16/2014  . Rabies, IM 10/22/2012  . Rabies, intradermal 10/22/2012  . Tdap 10/22/2012  . Zoster Recombinat (Shingrix) 10/25/2017    Qualifies for Shingles Vaccine? Due for second shot. Recommended patient to call [facility] where he got the first shot to get second shot as soon as he can  Screening Tests Health Maintenance  Topic Date Due  . HIV Screening  10/23/2018 (Originally 10/30/1971)  . INFLUENZA VACCINE  07/17/2018  . COLONOSCOPY  12/17/2018  . TETANUS/TDAP  10/22/2022  . Hepatitis C Screening  Completed   Cancer Screenings: Lung: Low Dose CT Chest recommended if Age 9-80 years, 30 pack-year currently smoking OR have quit w/in 15years. Patient does not qualify. Colorectal: up to date  Additional Screenings:  Hepatitis C Screening:declined      Plan:    I have personally reviewed and addressed the Medicare Annual Wellness questionnaire and have noted the following in the patient's chart:  A. Medical and social history B. Use of alcohol, tobacco or illicit drugs  C. Current medications and supplements D. Functional ability and status E.  Nutritional status F.  Physical activity G. Advance directives H. List of other physicians I.  Hospitalizations, surgeries, and ER visits in previous 12 months J.  Vitals K. Screenings to include hearing, vision, cognitive, depression L. Referrals and appointments - C3  In addition, I have  reviewed and discussed with patient certain preventive protocols, quality metrics, and best practice recommendations. A written personalized care plan  for preventive services as well as general preventive health recommendations were provided to patient.  See attached scanned questionnaire for additional information.   Signed,   Tyron RussellSara Saunders, RN Nurse Health Advisor  Patient concerns: Requested xray for right hip due to moderate pain

## 2018-07-15 LAB — CBC WITH DIFFERENTIAL/PLATELET
BASOS ABS: 111 {cells}/uL (ref 0–200)
Basophils Relative: 1.1 %
EOS ABS: 81 {cells}/uL (ref 15–500)
Eosinophils Relative: 0.8 %
HEMATOCRIT: 43 % (ref 38.5–50.0)
HEMOGLOBIN: 14.5 g/dL (ref 13.2–17.1)
LYMPHS ABS: 7555 {cells}/uL — AB (ref 850–3900)
MCH: 30.4 pg (ref 27.0–33.0)
MCHC: 33.7 g/dL (ref 32.0–36.0)
MCV: 90.1 fL (ref 80.0–100.0)
MPV: 10.5 fL (ref 7.5–12.5)
Monocytes Relative: 7.9 %
Neutro Abs: 1555 cells/uL (ref 1500–7800)
Neutrophils Relative %: 15.4 %
Platelets: 187 10*3/uL (ref 140–400)
RBC: 4.77 10*6/uL (ref 4.20–5.80)
RDW: 13.3 % (ref 11.0–15.0)
Total Lymphocyte: 74.8 %
WBC: 10.1 10*3/uL (ref 3.8–10.8)
WBCMIX: 798 {cells}/uL (ref 200–950)

## 2018-07-15 LAB — URINALYSIS, ROUTINE W REFLEX MICROSCOPIC
BILIRUBIN URINE: NEGATIVE
GLUCOSE, UA: NEGATIVE
Hgb urine dipstick: NEGATIVE
KETONES UR: NEGATIVE
Leukocytes, UA: NEGATIVE
Nitrite: NEGATIVE
Protein, ur: NEGATIVE
SPECIFIC GRAVITY, URINE: 1.023 (ref 1.001–1.03)
pH: 5.5 (ref 5.0–8.0)

## 2018-07-15 LAB — PSA: PSA: 0.5 ng/mL (ref <=4.0)

## 2018-07-15 LAB — LIPID PANEL
CHOL/HDL RATIO: 4 (calc) (ref <5.0)
Cholesterol: 171 mg/dL (ref <200)
HDL: 43 mg/dL (ref >40)
LDL CHOLESTEROL (CALC): 98 mg/dL
NON-HDL CHOLESTEROL (CALC): 128 mg/dL (ref <130)
Triglycerides: 204 mg/dL — ABNORMAL HIGH (ref <150)

## 2018-07-15 LAB — COMPLETE METABOLIC PANEL WITH GFR
AG Ratio: 1.3 (calc) (ref 1.0–2.5)
ALKALINE PHOSPHATASE (APISO): 64 U/L (ref 40–115)
ALT: 23 U/L (ref 9–46)
AST: 30 U/L (ref 10–35)
Albumin: 4.3 g/dL (ref 3.6–5.1)
BUN: 22 mg/dL (ref 7–25)
CALCIUM: 9.2 mg/dL (ref 8.6–10.3)
CO2: 26 mmol/L (ref 20–32)
CREATININE: 0.99 mg/dL (ref 0.70–1.25)
Chloride: 106 mmol/L (ref 98–110)
GFR, EST NON AFRICAN AMERICAN: 82 mL/min/{1.73_m2} (ref >=60)
GFR, Est African American: 95 mL/min/{1.73_m2} (ref >=60)
Globulin: 3.2 g/dL (calc) (ref 1.9–3.7)
Glucose, Bld: 93 mg/dL (ref 65–99)
POTASSIUM: 4.2 mmol/L (ref 3.5–5.3)
SODIUM: 138 mmol/L (ref 135–146)
Total Bilirubin: 0.4 mg/dL (ref 0.2–1.2)
Total Protein: 7.5 g/dL (ref 6.1–8.1)

## 2018-07-15 LAB — TSH: TSH: 2.64 mIU/L (ref 0.40–4.50)

## 2018-07-18 ENCOUNTER — Ambulatory Visit: Payer: Self-pay

## 2018-07-18 ENCOUNTER — Ambulatory Visit (INDEPENDENT_AMBULATORY_CARE_PROVIDER_SITE_OTHER): Payer: Medicare Other | Admitting: Internal Medicine

## 2018-07-18 ENCOUNTER — Encounter: Payer: Self-pay | Admitting: Internal Medicine

## 2018-07-18 VITALS — BP 118/72 | HR 102 | Temp 97.7°F | Resp 10 | Ht 71.0 in | Wt 150.6 lb

## 2018-07-18 DIAGNOSIS — Z0000 Encounter for general adult medical examination without abnormal findings: Secondary | ICD-10-CM | POA: Diagnosis not present

## 2018-07-18 DIAGNOSIS — E782 Mixed hyperlipidemia: Secondary | ICD-10-CM

## 2018-07-18 DIAGNOSIS — M5440 Lumbago with sciatica, unspecified side: Secondary | ICD-10-CM | POA: Diagnosis not present

## 2018-07-18 DIAGNOSIS — F310 Bipolar disorder, current episode hypomanic: Secondary | ICD-10-CM

## 2018-07-18 DIAGNOSIS — G8929 Other chronic pain: Secondary | ICD-10-CM

## 2018-07-18 DIAGNOSIS — I10 Essential (primary) hypertension: Secondary | ICD-10-CM | POA: Diagnosis not present

## 2018-07-18 DIAGNOSIS — M150 Primary generalized (osteo)arthritis: Secondary | ICD-10-CM | POA: Diagnosis not present

## 2018-07-18 DIAGNOSIS — Z79899 Other long term (current) drug therapy: Secondary | ICD-10-CM

## 2018-07-18 DIAGNOSIS — M159 Polyosteoarthritis, unspecified: Secondary | ICD-10-CM

## 2018-07-18 DIAGNOSIS — I69351 Hemiplegia and hemiparesis following cerebral infarction affecting right dominant side: Secondary | ICD-10-CM

## 2018-07-18 MED ORDER — METHYLPREDNISOLONE 4 MG PO TBPK: ORAL_TABLET | ORAL | 0 refills | Status: DC

## 2018-07-18 MED ORDER — METHYLPREDNISOLONE ACETATE 40 MG/ML IJ SUSP
40.0000 mg | Freq: Once | INTRAMUSCULAR | Status: AC
  Administered 2018-07-18: 40 mg via INTRAMUSCULAR

## 2018-07-18 MED ORDER — OXYCODONE HCL 15 MG PO TABS: 15.0000 mg | ORAL_TABLET | Freq: Four times a day (QID) | ORAL | 0 refills | Status: DC | PRN

## 2018-07-18 NOTE — Patient Instructions (Addendum)
DEPO-MEDROL 40MG  INJECTION GIVEN TODAY  START ROXICODONE 15MG  EVERY 6 HRS AS NEEDED FOR MODERATE-SEVER PAIN  START MEDROL DOSE PACK AS DIRECTED ON Saturday AUGUST 3RD  Continue other medications as ordered  Follow up with specialists as scheduled  [facility]  Follow up in 3 mos with Shanda BumpsJessica for chronic pain, HTN, bipolar d/o. Fasting labs prior to appt  Keeping you healthy  Get these tests  Blood pressure- Have your blood pressure checked once a year by your healthcare provider.  Normal blood pressure is 120/80  Weight- Have your body mass index (BMI) calculated to screen for obesity.  BMI is a measure of body fat based on height and weight. You can also calculate your own BMI at [email or web address]  Cholesterol- Have your cholesterol checked every year.  Diabetes- Have your blood sugar checked regularly if you have high blood pressure, high cholesterol, have a family history of diabetes or if you are overweight.  Screening for Colon Cancer- Colonoscopy starting at age 62.  Screening may begin sooner depending on your family history and other health conditions. Follow up colonoscopy as directed by your Gastroenterologist.  Screening for Prostate Cancer- Both blood work (PSA) and a rectal exam help screen for Prostate Cancer.  Screening begins at age 62 with African-American men and at age 62 with Caucasian men.  Screening may begin sooner depending on your family history.  Take these medicines  Aspirin- One aspirin daily can help prevent Heart disease and Stroke.  Flu shot- Every fall.  Tetanus- Every 10 years.  Zostavax- Once after the age of 62 to prevent Shingles.  Pneumonia shot- Once after the age of 62; if you are younger than 4365, ask your healthcare provider if you need a Pneumonia shot.  Take these steps  Don't smoke- If you do smoke, talk to your doctor about quitting.  For tips on how to quit, go to www.smokefree.gov or call  1-800-QUIT-NOW.  Be physically active- Exercise 5 days a week for at least 30 minutes.  If you are not already physically active start slow and gradually work up to 30 minutes of moderate physical activity.  Examples of moderate activity include walking briskly, mowing the yard, dancing, swimming, bicycling, etc.  Eat a healthy diet- Eat a variety of healthy food such as fruits, vegetables, low fat milk, low fat cheese, yogurt, lean meant, poultry, fish, beans, tofu, etc. For more information go to www.thenutritionsource.org  Drink alcohol in moderation- Limit alcohol intake to less than two drinks a day. Never drink and drive.  Dentist- Brush and floss twice daily; visit your dentist twice a year.  Depression- Your emotional health is as important as your physical health. If you're feeling down, or losing interest in things you would normally enjoy please talk to your healthcare provider.  Eye exam- Visit your eye doctor every year.  Safe sex- If you may be exposed to a sexually transmitted infection, use a condom.  Seat belts- Seat belts can save your life; always wear one.  Smoke/Carbon Monoxide detectors- These detectors need to be installed on the appropriate level of your home.  Replace batteries at least once a year.  Skin cancer- When out in the sun, cover up and use sunscreen 15 SPF or higher.  Violence- If anyone is threatening you, please tell your healthcare provider.  Living Will/ Health care power of attorney- Speak with your healthcare provider and family.

## 2018-07-18 NOTE — Progress Notes (Signed)
Patient ID: Nathan Buchanan, male   DOB: October 18, 1956, 62 y.o.   MRN: [id number]   Location:  [facility]  Place of Service:  OFFICE  Provider: Arletha Grippe, DO  Patient Care Team: Gildardo Cranker, DO as PCP - General (Internal Medicine) Rosalin Hawking, MD as Consulting Physician (Neurology)  Extended Emergency Contact Information Primary Emergency Contact: [facility] Address: [address]          [place], [place] [address] Nathan Buchanan Phone: [phone] Mobile Phone: [phone] Relation: Significant other  Code Status:  Goals of Care: Advanced Directive information Advanced Directives 07/14/2018  Does Patient Have a Medical Advance Directive? No  Would patient like information on creating a medical advance directive? Yes (MAU/Ambulatory/Procedural Areas - Information given)     Chief Complaint  Patient presents with  . Annual Exam    Yearly check-up, AWV completed 07/14/18, + fall risk, -depression. EKG due   . Medication Refill    No refills needed   . Health Maintenance    Refused Hep C screening, patient to call [facility] to set up zoster admin     HPI: Patient is a 62 y.o. male seen in today for a comprehensive exam. He reports increased stressors due to c/a pension plan being reduced. He has not been eating well and reports decreased appetite due to pain in back and left knee. He has not had pain med since Nov 2018. Weight down 5 lbs. He completed AWV last week. MMSE 26/30 in 2018. Lab results form 07/14/18 reviewed with pt.  He is a poor historian due to psych d/o. Hx obtained from chart. He is followed by [facility] providers.  HTN - BP stable on HCTZ. No muscle cramps.  Arthritis - multiple joints. Pain uncontrolled off roxicodone.   Hyperlipidemia - stable on statin. LDL 107  Bipolar d/o - mood borderline off meds. No SI/HI. He no longer takes valium.  Hx CVA - he has right hemiparesis. He completed PT. He takes ASA daily. Uses power scooter to get around. No  new weakness  Hep C - liver enzymes nml.  Glaucoma - Vision stable. He uses lumigan eye gtts. Followed by [facility] provider  Hx Abnormal CBC - he saw [facility] provider for f/u in Feb 2017 and repeat CBC nml. No leukemia at the time. Abs lymphocyte count 7555 on 07/14/18  Dermatitis - stable on topical lotrisone cream  Constipation - stable on colace  Hyperlipidemia - stable on lipitor. LDL 98; TG 204; HDL 43   Depression screen [facility] 2/9 07/18/2018 07/14/2018 01/10/2017 09/12/2016 09/09/2015  Decreased Interest 0 0 0 2 0  Down, Depressed, Hopeless 0 0 0 2 0  PHQ - 2 Score 0 0 0 4 0  Altered sleeping - - - - -  Tired, decreased energy - - - - -  Change in appetite - - - - -  Feeling bad or failure about yourself  - - - - -  Trouble concentrating - - - - -  Moving slowly or fidgety/restless - - - - -  Suicidal thoughts - - - - -  PHQ-9 Score - - - - -    Fall Risk  07/18/2018 07/14/2018 01/16/2017 01/10/2017 09/12/2016  Falls in the past year? Yes Yes No No Yes  Number falls in past yr: 1 1 - - 1  Injury with Fall? Yes Yes - - Yes  Comment - R knee - - -   MMSE - Mini Mental State Exam  01/10/2017  Orientation to time 5  Orientation to Place 5  Registration 3  Attention/ Calculation 3  Recall 3  Language- name 2 objects 2  Language- repeat 1  Language- follow 3 step command 3  Language- read & follow direction 1  Write a sentence 0  Copy design 0  Total score 26     Health Maintenance  Topic Date Due  . INFLUENZA VACCINE  07/17/2018  . HIV Screening  10/23/2018 (Originally 10/30/1971)  . COLONOSCOPY  12/17/2018  . TETANUS/TDAP  10/22/2022  . Hepatitis C Screening  Completed    Urinary incontinence?  Functional Status Survey:    Exercise?  Diet?  No exam data present  Hearing:    Dentition:  Pain:  Past Medical History:  Diagnosis Date  . Arthritis   . Back ache   . Bipolar I disorder, most recent episode (or current) unspecified    no meds at present  . Chronic  hepatitis C without mention of hepatic coma    tx. with meds/ was told" was cured"  . Cirrhosis of liver without mention of alcohol   . Dermatophytosis of the body   . Generalized osteoarthrosis, involving multiple sites   . Hyperlipidemia   . Hypertension   . Osteoarthrosis, unspecified whether generalized or localized, unspecified site   . Stroke [facility])     12'15 -Right sided weakness remains  . Unspecified glaucoma(365.9)   . Urinary frequency     Past Surgical History:  Procedure Laterality Date  . ABCESS DRAINAGE     Gluteal MRSA drainage   . ANAL FISSURE REPAIR  2008  . COLONOSCOPY  2008   Dr.Edwards, Internal Hemorrhoids   . CYSTOSCOPY N/A 01/24/2015   Procedure: CYSTOSCOPY FLEXIBLE;  Surgeon: Bernestine Amass, MD;  Location: [facility];  Service: Urology;  Laterality: N/A;  . PENILE PROSTHESIS IMPLANT  04/12/2010   Dr.Grapey, Insertion of 3 peice penile prosthesis   . PENILE PROSTHESIS IMPLANT N/A 01/24/2015   Procedure: EXPLANTATION OF PENILE PROSTHESIS;  Surgeon: Bernestine Amass, MD;  Location: [facility];  Service: Urology;  Laterality: N/A;    Family History  Problem Relation Age of Onset  . Hypertension Mother   . Diabetes Mother   . Stroke Father   . Cancer Father        colon  . Diabetes Sister   . Diabetes Sister   . Diabetes Brother    Family Status  Relation Name Status  . Mother  Deceased  . Father  Deceased  . Sister Richrd Sox  . Brother [employer]  . Daughter Audiological scientist  . Son [employer]  . Sister [employer]  . Sister [employer]  . Sister Oswaldo Conroy  . Sister Armandina Gemma  . Brother [employer]  . Brother [employer]  . Daughter [employer]  . Son [employer]  . Son [employer]  . Sister  (Not Specified)  . Sister  (Not Specified)  . Brother  (Not Specified)    shoulder  Social History   Socioeconomic History  . Marital status: Married    Spouse name: Not on file  . Number of children: Not on file  . Years of education: Not on  file  . Highest education level: Not on file  Occupational History  . Not on file  Social Needs  . Financial resource strain: Not hard at all  . Food insecurity:    Worry: Never true    Inability: Never  true  . Transportation needs:    Medical: No    Non-medical: No  Tobacco Use  . Smoking status: Former Smoker    Years: 5.00    Last attempt to quit: 12/17/2013    Years since quitting: 4.5  . Smokeless tobacco: Never Used  Substance and Sexual Activity  . Alcohol use: Yes    Alcohol/week: 0.0 oz    Comment: wine rare occ.  . Drug use: Yes    Types: Marijuana    Comment: a joint a day to ease pain  . Sexual activity: Yes  Lifestyle  . Physical activity:    Days per week: 0 days    Minutes per session: 0 min  . Stress: Only a little  Relationships  . Social connections:    Talks on phone: More than three times a week    Gets together: Never    Attends religious service: More than 4 times per year    Active member of club or organization: No    Attends meetings of clubs or organizations: Never    Relationship status: Married  . Intimate partner violence:    Fear of current or ex partner: No    Emotionally abused: No    Physically abused: No    Forced sexual activity: No  Other Topics Concern  . Not on file  Social History Narrative  . Not on file    No Known Allergies  Allergies as of 07/18/2018   No Known Allergies     Medication List        Accurate as of 07/18/18  1:03 PM. Always use your most recent med list.          aspirin EC 325 MG tablet Take 1 tablet (325 mg total) by mouth daily.   atorvastatin 20 MG tablet Commonly known as:  LIPITOR Take 1 tablet (20 mg total) by mouth daily at 6 PM. For Cholesterol   bimatoprost 0.03 % ophthalmic solution Commonly known as:  LUMIGAN Place 1 drop into both eyes at bedtime.   clotrimazole-betamethasone cream Commonly known as:  LOTRISONE apply to affected area twice a day   DSS 100 MG Caps Take 100 mg  by mouth 2 (two) times daily.   hydrochlorothiazide 50 MG tablet Commonly known as:  HYDRODIURIL Take 1 tablet (50 mg total) by mouth daily with breakfast.        Review of Systems:  Review of Systems  Constitutional: Positive for appetite change and unexpected weight change.  Musculoskeletal: Positive for arthralgias, back pain and gait problem.  Psychiatric/Behavioral: The patient is nervous/anxious.   All other systems reviewed and are negative.   Physical Exam: Vitals:   07/18/18 1235  BP: 118/72  Pulse: (!) 102  Resp: 10  Temp: 97.7 F (36.5 C)  TempSrc: Oral  SpO2: 96%  Weight: 150 lb 9.6 oz (68.3 kg)  Height: '5\' 11"'$  (1.803 m)   Body mass index is 21 kg/m. Physical Exam  Constitutional: He is oriented to person, place, and time. He appears well-developed and well-nourished. No distress.  HENT:  Head: Normocephalic and atraumatic.  Right Ear: Hearing, tympanic membrane, external ear and ear canal normal.  Left Ear: Hearing, tympanic membrane, external ear and ear canal normal.  Mouth/Throat: Uvula is midline, oropharynx is clear and moist and mucous membranes are normal. He does not have dentures.  Eyes: Pupils are equal, round, and reactive to light. Conjunctivae, EOM and lids are normal. No scleral icterus.  Neck: Trachea  normal and normal range of motion. Neck supple. Carotid bruit is not present. No thyroid mass and no thyromegaly present.  Cardiovascular: Regular rhythm, normal heart sounds and intact distal pulses. Tachycardia present. Exam reveals no gallop and no friction rub.  No murmur heard. No LE edema b/l. No calf TTP.   Pulmonary/Chest: Effort normal and breath sounds normal. He has no wheezes. He has no rhonchi. He has no rales. He exhibits no mass. Right breast exhibits no inverted nipple, no mass, no nipple discharge, no skin change and no tenderness. Left breast exhibits no inverted nipple, no mass, no nipple discharge, no skin change and no  tenderness. Breasts are symmetrical.  Abdominal: Soft. Normal appearance, normal aorta and bowel sounds are normal. He exhibits no pulsatile midline mass and no mass. There is no hepatosplenomegaly. There is no tenderness. There is no rigidity, no rebound and no guarding. No hernia.  Musculoskeletal: He exhibits edema, tenderness and deformity (RUE contracture).       Lumbar back: He exhibits decreased range of motion, tenderness and spasm.       Back:  Increased diaphoresis over left lumbar spine  Lymphadenopathy:       Head (right side): No posterior auricular adenopathy present.       Head (left side): No posterior auricular adenopathy present.    He has no cervical adenopathy.       Right: No supraclavicular adenopathy present.       Left: No supraclavicular adenopathy present.  Neurological: He is alert and oriented to person, place, and time. He has normal strength and normal reflexes. No cranial nerve deficit. Gait (antalgic) abnormal.  right hemiparesis  Skin: Skin is warm and intact. No rash noted. He is diaphoretic. Nails show no clubbing.  Psychiatric: Judgment and thought content normal. His speech is slurred. He is agitated. He is not actively hallucinating. Cognition and memory are normal. He exhibits a depressed mood.    Labs reviewed:  Basic Metabolic Panel: Recent Labs    04/22/18 0838 07/14/18 0912  NA 140 138  K 3.9 4.2  CL 108 106  CO2  --  26  GLUCOSE 96 93  BUN 13 22  CREATININE 0.80 0.99  CALCIUM  --  9.2  TSH  --  2.64   Liver Function Tests: Recent Labs    07/14/18 0912  AST 30  ALT 23  BILITOT 0.4  PROT 7.5   No results for input(s): LIPASE, AMYLASE in the last 8760 hours. No results for input(s): AMMONIA in the last 8760 hours. CBC: Recent Labs    04/22/18 0838 07/14/18 0912  WBC  --  10.1  NEUTROABS  --  1,555  HGB 15.3 14.5  HCT 45.0 43.0  MCV  --  90.1  PLT  --  187   Lipid Panel: Recent Labs    07/14/18 0912  CHOL 171  HDL  43  LDLCALC 98  TRIG 204*  CHOLHDL 4.0   Lab Results  Component Value Date   HGBA1C 5.5 12/05/2014    Procedures: No results found. ECG OBTAINED AND REVIEWED BY MYSELF: NSR @ 96 bpm, LAD, LAE, IVCD, poor R wave progression. No acute ischemic changes. No change since Jan 2018  Assessment/Plan   ICD-10-CM   1. Well adult exam Z00.00   2. Primary osteoarthritis involving multiple joints M15.0 methylPREDNISolone (MEDROL DOSEPAK) 4 MG TBPK tablet    oxyCODONE (ROXICODONE) 15 MG immediate release tablet    methylPREDNISolone acetate (DEPO-MEDROL) injection 40 mg  DISCONTINUED: oxyCODONE (ROXICODONE) 15 MG immediate release tablet  3. Chronic left-sided low back pain with sciatica, sciatica laterality unspecified M54.40 methylPREDNISolone (MEDROL DOSEPAK) 4 MG TBPK tablet   G89.29 oxyCODONE (ROXICODONE) 15 MG immediate release tablet    methylPREDNISolone acetate (DEPO-MEDROL) injection 40 mg    DISCONTINUED: oxyCODONE (ROXICODONE) 15 MG immediate release tablet  4. Essential hypertension I10 EKG 12-Lead    CMP with eGFR([facility])  5. Hemiparesis affecting right side as late effect of cerebrovascular accident ([place]) I69.351   6. Bipolar affective disorder, current episode hypomanic ([place]) F31.0 CMP with eGFR([facility])  7. Mixed hyperlipidemia E78.2 Lipid Panel  8. High risk medication use Z79.899 CMP with eGFR([facility])     DEPO-MEDROL 40MG INJECTION GIVEN TODAY  START ROXICODONE 15MG EVERY 6 HRS AS NEEDED FOR MODERATE-SEVER PAIN  START MEDROL DOSE PACK AS DIRECTED ON Saturday AUGUST 3RD  Continue other medications as ordered  Colon cancer screening with cologuard  Follow up with specialists as scheduled  Follow up with [facility] providers as scheduled  Follow up in 3 mos with Janett Billow for chronic pain, HTN, bipolar d/o. Fasting labs prior to appt  Keeping you healthy handout given  Cordella Register. Perlie Gold  [facility] [address] [place], [place] [address] [phone] Cell (Monday-Friday 8 AM - 5 PM) [phone] After 5 PM and follow prompts

## 2018-07-31 DIAGNOSIS — Z1212 Encounter for screening for malignant neoplasm of rectum: Secondary | ICD-10-CM | POA: Diagnosis not present

## 2018-07-31 DIAGNOSIS — Z1211 Encounter for screening for malignant neoplasm of colon: Secondary | ICD-10-CM | POA: Diagnosis not present

## 2018-08-06 ENCOUNTER — Encounter: Payer: Self-pay | Admitting: Internal Medicine

## 2018-08-07 LAB — COLOGUARD: Cologuard: NEGATIVE

## 2018-08-08 ENCOUNTER — Telehealth: Payer: Self-pay

## 2018-08-08 NOTE — Telephone Encounter (Signed)
Per Dr.Carter, no increased colon cancer risk.  Discussed results with patient, patient verbalized understanding of results

## 2018-08-15 ENCOUNTER — Other Ambulatory Visit: Payer: Self-pay

## 2018-08-15 DIAGNOSIS — M159 Polyosteoarthritis, unspecified: Secondary | ICD-10-CM

## 2018-08-15 DIAGNOSIS — G8929 Other chronic pain: Secondary | ICD-10-CM

## 2018-08-15 DIAGNOSIS — M5440 Lumbago with sciatica, unspecified side: Secondary | ICD-10-CM

## 2018-08-15 DIAGNOSIS — M150 Primary generalized (osteo)arthritis: Principal | ICD-10-CM

## 2018-08-15 MED ORDER — OXYCODONE HCL 15 MG PO TABS: 15.0000 mg | ORAL_TABLET | Freq: Four times a day (QID) | ORAL | 0 refills | Status: DC | PRN

## 2018-08-15 NOTE — Telephone Encounter (Signed)
Patient aware rx sent  

## 2018-08-15 NOTE — Telephone Encounter (Signed)
Refill request for Oxycodone, last filled 07/18/18  Portal Database verified and compliance confirmed

## 2018-08-19 ENCOUNTER — Ambulatory Visit: Payer: Self-pay

## 2018-09-15 ENCOUNTER — Other Ambulatory Visit: Payer: Self-pay | Admitting: *Deleted

## 2018-09-15 DIAGNOSIS — M5440 Lumbago with sciatica, unspecified side: Secondary | ICD-10-CM

## 2018-09-15 DIAGNOSIS — M159 Polyosteoarthritis, unspecified: Secondary | ICD-10-CM

## 2018-09-15 DIAGNOSIS — G8929 Other chronic pain: Secondary | ICD-10-CM

## 2018-09-15 DIAGNOSIS — M150 Primary generalized (osteo)arthritis: Principal | ICD-10-CM

## 2018-09-15 MED ORDER — OXYCODONE HCL 15 MG PO TABS: 15.0000 mg | ORAL_TABLET | Freq: Four times a day (QID) | ORAL | 0 refills | Status: DC | PRN

## 2018-09-15 NOTE — Telephone Encounter (Signed)
Patient called requesting refill [place] Database Verified LR: 08/17/2018 Pended Rx and sent to Dr. Montez Morita for approval.

## 2018-10-13 ENCOUNTER — Telehealth: Payer: Self-pay | Admitting: Internal Medicine

## 2018-10-13 ENCOUNTER — Other Ambulatory Visit: Payer: Self-pay

## 2018-10-13 DIAGNOSIS — M5440 Lumbago with sciatica, unspecified side: Secondary | ICD-10-CM

## 2018-10-13 DIAGNOSIS — G8929 Other chronic pain: Secondary | ICD-10-CM

## 2018-10-13 DIAGNOSIS — M159 Polyosteoarthritis, unspecified: Secondary | ICD-10-CM

## 2018-10-13 DIAGNOSIS — M150 Primary generalized (osteo)arthritis: Principal | ICD-10-CM

## 2018-10-13 MED ORDER — OXYCODONE HCL 15 MG PO TABS: 15.0000 mg | ORAL_TABLET | Freq: Four times a day (QID) | ORAL | 0 refills | Status: DC | PRN

## 2018-10-13 NOTE — Telephone Encounter (Signed)
Patient aware rx sent to pharmacy although the pharmacy may not release rx until it is due on 10/15/18

## 2018-10-13 NOTE — Telephone Encounter (Signed)
[facility] Database verified and compliance confirmed   Last filled 09/15/18

## 2018-10-13 NOTE — Telephone Encounter (Signed)
I spoke with the patient about referral for food and gave him information for [facility] Address: [address] [place], [place] [address] Hours: Tuesday - Friday 10AM-1PM Phone: [phone].  He also agreed to a referral to other agencies that will reach out to him. VDM (DD)

## 2018-10-14 ENCOUNTER — Other Ambulatory Visit: Payer: Self-pay | Admitting: *Deleted

## 2018-10-14 DIAGNOSIS — G8929 Other chronic pain: Secondary | ICD-10-CM

## 2018-10-14 DIAGNOSIS — M159 Polyosteoarthritis, unspecified: Secondary | ICD-10-CM

## 2018-10-14 DIAGNOSIS — M5440 Lumbago with sciatica, unspecified side: Secondary | ICD-10-CM

## 2018-10-14 DIAGNOSIS — M150 Primary generalized (osteo)arthritis: Principal | ICD-10-CM

## 2018-10-14 MED ORDER — OXYCODONE HCL 15 MG PO TABS: 15.0000 mg | ORAL_TABLET | Freq: Four times a day (QID) | ORAL | 0 refills | Status: DC | PRN

## 2018-10-14 NOTE — Telephone Encounter (Signed)
Patient requested refill [place] Database Verified LR: 09/15/2018 Pended Rx and sent to Dr. Carter for approval.  

## 2018-10-15 LAB — HEPATIC FUNCTION PANEL
ALK PHOS: 79 (ref 25–125)
ALT: 30 (ref 10–40)
AST: 28 (ref 14–40)
BILIRUBIN, TOTAL: 0.4
Bilirubin, Direct: 0.1 (ref 0.01–0.4)

## 2018-10-15 LAB — CBC AND DIFFERENTIAL
HCT: 41 (ref 41–53)
HCT: 41 (ref 41–53)
HCT: 41 (ref 41–53)
HEMOGLOBIN: 13.7 (ref 13.5–17.5)
Hemoglobin: 13.7 (ref 13.5–17.5)
Hemoglobin: 13.7 (ref 13.5–17.5)
NEUTROS ABS: 16
PLATELETS: 205 (ref 150–399)
PLATELETS: 205 (ref 150–399)
WBC: 7.9

## 2018-10-15 LAB — LIPID PANEL
CHOLESTEROL: 169 (ref 0–200)
HDL: 54 (ref 35–70)
LDL CALC: 78
TRIGLYCERIDES: 186 — AB (ref 40–160)

## 2018-10-15 LAB — HEMOGLOBIN A1C: Hgb A1c MFr Bld: 5.5 (ref 4.0–6.0)

## 2018-10-15 LAB — TSH: TSH: 2.96 (ref 0.41–5.90)

## 2018-10-15 LAB — BASIC METABOLIC PANEL
BUN: 16 (ref 4–21)
Creatinine: 1.1 (ref 0.6–1.3)
Glucose: 81
Potassium: 3.6 (ref 3.4–5.3)
Sodium: 139 (ref 137–147)

## 2018-10-20 ENCOUNTER — Ambulatory Visit: Payer: Medicare Other | Admitting: Nurse Practitioner

## 2018-10-21 NOTE — Telephone Encounter (Signed)
Pt was mailed registration information for congregate meal program.

## 2018-10-22 ENCOUNTER — Ambulatory Visit (INDEPENDENT_AMBULATORY_CARE_PROVIDER_SITE_OTHER): Payer: Medicare Other | Admitting: Nurse Practitioner

## 2018-10-22 ENCOUNTER — Encounter: Payer: Self-pay | Admitting: Nurse Practitioner

## 2018-10-22 VITALS — BP 126/82 | HR 96 | Temp 97.8°F | Ht 71.0 in | Wt 155.0 lb

## 2018-10-22 DIAGNOSIS — M150 Primary generalized (osteo)arthritis: Secondary | ICD-10-CM | POA: Diagnosis not present

## 2018-10-22 DIAGNOSIS — G8929 Other chronic pain: Secondary | ICD-10-CM

## 2018-10-22 DIAGNOSIS — I69351 Hemiplegia and hemiparesis following cerebral infarction affecting right dominant side: Secondary | ICD-10-CM | POA: Diagnosis not present

## 2018-10-22 DIAGNOSIS — I10 Essential (primary) hypertension: Secondary | ICD-10-CM | POA: Diagnosis not present

## 2018-10-22 DIAGNOSIS — F310 Bipolar disorder, current episode hypomanic: Secondary | ICD-10-CM

## 2018-10-22 DIAGNOSIS — E782 Mixed hyperlipidemia: Secondary | ICD-10-CM | POA: Diagnosis not present

## 2018-10-22 DIAGNOSIS — M5440 Lumbago with sciatica, unspecified side: Secondary | ICD-10-CM | POA: Diagnosis not present

## 2018-10-22 DIAGNOSIS — M159 Polyosteoarthritis, unspecified: Secondary | ICD-10-CM

## 2018-10-22 DIAGNOSIS — M7061 Trochanteric bursitis, right hip: Secondary | ICD-10-CM

## 2018-10-22 NOTE — Patient Instructions (Addendum)
BRING ALL MEDICATION BOTTLES WITH YOU TO YOUR APPTS  To start ALEVE (naproxen) 220 mg by mouth twice daily for 1 week To use heat or ice 3 times daily ~20 mins Can use muscle rub AFTER heat/ice to help with discomfort   Follow up in 4 months for routine follow up Hip Bursitis Hip bursitis is swelling of a fluid-filled sac (bursa) in your hip. This swelling (inflammation) can be painful. This condition may come and go over time. Follow these instructions at home: Medicines  Take over-the-counter and prescription medicines only as told by your doctor.  Do not drive or use heavy machinery while taking prescription pain medicine, or as told by your doctor.  If you were prescribed an antibiotic medicine, take it as told by your doctor. Do not stop taking the antibiotic even if you start to feel better. Activity  Return to your normal activities as told by your doctor. Ask your doctor what activities are safe for you.  Rest and protect your hip until you feel better. General instructions  Wear wraps that put pressure on your hip (compression wraps) only as told by your doctor.  Raise (elevate) your hip above the level of your heart as much as you can. To do this, try putting a pillow under your hips while you lie down. Stop if this causes pain.  Do not use your hip to support your body weight until your doctor says that you can.  Use crutches as told by your doctor.  Gently rub and stretch your injured area as often as is comfortable.  Keep all follow-up visits as told by your doctor. This is important. How is this prevented?  Exercise regularly, as told by your doctor.  Warm up and stretch before being active.  Cool down and stretch after being active.  Avoid activities that bother your hip or cause pain.  Avoid sitting down for long periods at a time. Contact a doctor if:  You have a fever.  You get new symptoms.  You have trouble walking.  You have trouble doing  everyday activities.  You have pain that gets worse.  You have pain that does not get better with medicine.  You get red skin on your hip area.  You get a feeling of warmth in your hip area. Get help right away if:  You cannot move your hip.  You have very bad pain. This information is not intended to replace advice given to you by your health care provider. Make sure you discuss any questions you have with your health care provider. Document Released: 01/05/2011 Document Revised: 05/10/2016 Document Reviewed: 07/05/2015 Elsevier Interactive Patient Education  [employer].

## 2018-10-22 NOTE — Progress Notes (Signed)
Careteam: Patient Care Team: Sharon Seller, NP as PCP - General (Geriatric Medicine) Marvel Plan, MD as Consulting Physician (Neurology)  Advanced Directive information Does Patient Have a Medical Advance Directive?: No  No Known Allergies  Chief Complaint  Patient presents with  . Medical Management of Chronic Issues    Pt is being seen for a 3 month routine visit. Pt reports his is having right side hip pain at during the night.      HPI: Patient is a 62 y.o. male seen in the office formally Dr Assunta Found pt being seen today for routine follow up.   He is a poor historian due to psych d/o.  He is followed by [facility] providers. [facility] not [facility] giving [facility].   HTN - BP stable on HCTZ. No muscle cramps.  Arthritis - multiple joints. Pain uncontrolled off oxycodone. Uses every 6 hour for pain. Gets good relief from medication. Helping with hip pain (which is new)  Hyperlipidemia - continues lipitor . LDL 98  Bipolar d/o - reports he is on a mood medication prescribed by [place]. Unsure the name and we do not have on list. No SI/HI. did not bring medication today  Hx CVA - he has right hemiparesis. He takes ASA daily. Has a hard time walking Uses power scooter to get around. No new weakness.   Hep C - liver enzymes nml.  Glaucoma - Vision stable. He uses [facility] eye gtts. Followed by [facility] eye provider  Hx Abnormal CBC - [facility] in Feb 2017 and repeat CBC nml. No leukemia at the time. Abs lymphocyte count 7555 on 07/14/18, reports he had recent blood work done at [place].  Dermatitis - stable on topical lotrisone cream, [facility] providing Rx  Constipation - stable on colace   Reports hip pain when he lays on right side, worse when he lays on it. Reports he favors to lay on right side but too painful, will move to the left but does not like to stay there. Worse with pressure and laying on it. Not worse with walking. Oxycodone helps but does not  resolve it.    Review of Systems:  Review of Systems  Constitutional: Negative for chills, fever and weight loss.  HENT: Negative for tinnitus.   Respiratory: Negative for cough, sputum production and shortness of breath.   Cardiovascular: Negative for chest pain, palpitations and leg swelling.  Gastrointestinal: Negative for abdominal pain, constipation, diarrhea and heartburn.  Genitourinary: Negative for dysuria, frequency and urgency.  Musculoskeletal: Positive for back pain, joint pain and myalgias. Negative for falls.  Skin: Negative.   Neurological: Negative for dizziness and headaches.  Psychiatric/Behavioral: Positive for depression and memory loss. The patient is nervous/anxious. The patient does not have insomnia.     Past Medical History:  Diagnosis Date  . Arthritis   . Back ache   . Bipolar I disorder, most recent episode (or current) unspecified    no meds at present  . Chronic hepatitis C without mention of hepatic coma    tx. with meds/ was told" was cured"  . Cirrhosis of liver without mention of alcohol   . Dermatophytosis of the body   . Generalized osteoarthrosis, involving multiple sites   . Hyperlipidemia   . Hypertension   . Osteoarthrosis, unspecified whether generalized or localized, unspecified site   . Stroke [facility])     12'15 -Right sided weakness remains  . Unspecified glaucoma(365.9)   .  Urinary frequency    Past Surgical History:  Procedure Laterality Date  . ABCESS DRAINAGE     Gluteal MRSA drainage   . ANAL FISSURE REPAIR  2008  . COLONOSCOPY  2008   Dr.Edwards, Internal Hemorrhoids   . CYSTOSCOPY N/A 01/24/2015   Procedure: CYSTOSCOPY FLEXIBLE;  Surgeon: Valetta Fuller, MD;  Location: [facility];  Service: Urology;  Laterality: N/A;  . PENILE PROSTHESIS IMPLANT  04/12/2010   Dr.Grapey, Insertion of 3 peice penile prosthesis   . PENILE PROSTHESIS IMPLANT N/A 01/24/2015   Procedure: EXPLANTATION OF PENILE PROSTHESIS;  Surgeon: Valetta Fuller, MD;   Location: [facility];  Service: Urology;  Laterality: N/A;   Social History:   reports that he quit smoking about 4 years ago. He quit after 5.00 years of use. He has never used smokeless tobacco. He reports that he drinks alcohol. He reports that he has current or past drug history. Drug: Marijuana.  Family History  Problem Relation Age of Onset  . Hypertension Mother   . Diabetes Mother   . Stroke Father   . Cancer Father        colon  . Diabetes Sister   . Diabetes Sister   . Diabetes Brother     Medications: Patient's Medications  New Prescriptions   No medications on file  Previous Medications   ASPIRIN EC 325 MG TABLET    Take 1 tablet (325 mg total) by mouth daily.   ATORVASTATIN (LIPITOR) 20 MG TABLET    Take 1 tablet (20 mg total) by mouth daily at 6 PM. For Cholesterol   BIMATOPROST (LUMIGAN) 0.03 % OPHTHALMIC SOLUTION    Place 1 drop into both eyes at bedtime.   CLOTRIMAZOLE-BETAMETHASONE (LOTRISONE) CREAM    apply to affected area twice a day   DOCUSATE SODIUM (DSS) 100 MG CAPS    Take 100 mg by mouth 2 (two) times daily.   HYDROCHLOROTHIAZIDE (HYDRODIURIL) 50 MG TABLET    Take 1 tablet (50 mg total) by mouth daily with breakfast.   OXYCODONE (ROXICODONE) 15 MG IMMEDIATE RELEASE TABLET    Take 1 tablet (15 mg total) by mouth every 6 (six) hours as needed for pain.  Modified Medications   No medications on file  Discontinued Medications   METHYLPREDNISOLONE (MEDROL DOSEPAK) 4 MG TBPK TABLET    Take as directed for inflammation and pain - START ON SATURDAY AUGUST 3RD     Physical Exam:  Vitals:   10/22/18 0826  BP: 126/82  Pulse: 96  Temp: 97.8 F (36.6 C)  TempSrc: Oral  SpO2: 98%  Weight: 155 lb (70.3 kg)  Height: 5\' 11"  (1.803 m)   Body mass index is 21.62 kg/m.  Physical Exam  Constitutional: He is oriented to person, place, and time. He appears well-developed and well-nourished.  HENT:  Mouth/Throat: Oropharynx is clear and moist.  MMM; no oral  thrush  Eyes: Pupils are equal, round, and reactive to light. No scleral icterus.  Neck: Neck supple. Carotid bruit is not present. No thyromegaly present.  Cardiovascular: Normal rate, regular rhythm and intact distal pulses. Exam reveals no gallop and no friction rub.  Murmur (1/6 SEM) heard. No distal LE edema. No calf TTP  Pulmonary/Chest: Effort normal and breath sounds normal. He has no wheezes. He has no rales. He exhibits no tenderness.  Abdominal: Soft. Normal appearance and bowel sounds are normal. He exhibits no distension, no abdominal bruit, no pulsatile midline mass and no mass. There is no hepatomegaly.  There is no tenderness. There is no rigidity, no rebound and no guarding. No hernia.  Musculoskeletal: He exhibits edema and deformity.  Lymphadenopathy:    He has no cervical adenopathy.  Neurological: He is alert and oriented to person, place, and time. Gait abnormal.  Right hemiparesis  Skin: Skin is warm and dry. No rash noted.  Psychiatric: He has a normal mood and affect. His behavior is normal. Thought content normal.    Labs reviewed: Basic Metabolic Panel: Recent Labs    04/22/18 0838 07/14/18 0912  NA 140 138  K 3.9 4.2  CL 108 106  CO2  --  26  GLUCOSE 96 93  BUN 13 22  CREATININE 0.80 0.99  CALCIUM  --  9.2  TSH  --  2.64  2 Liver Function Tests: Recent Labs    07/14/18 0912  AST 30  ALT 23  BILITOT 0.4  PROT 7.5   No results for input(s): LIPASE, AMYLASE in the last 8760 hours. No results for input(s): AMMONIA in the last 8760 hours. CBC: Recent Labs    04/22/18 0838 07/14/18 0912  WBC  --  10.1  NEUTROABS  --  1,555  HGB 15.3 14.5  HCT 45.0 43.0  MCV  --  90.1  PLT  --  187   Lipid Panel: Recent Labs    07/14/18 0912  CHOL 171  HDL 43  LDLCALC 98  TRIG 204*  CHOLHDL 4.0   TSH: Recent Labs    07/14/18 0912  TSH 2.64   A1C: Lab Results  Component Value Date   HGBA1C 5.5 12/05/2014     Assessment/Plan 1. Primary  osteoarthritis involving multiple joints Chronic pain noted improvement with pain with oxycodone 15 mg every 6 hours as needed, taking routinely due to increase in pain if he does not take  2. Chronic left-sided low back pain with sciatica, sciatica laterality unspecified -improved pain with oxycodone.  - Drug Tox Monitor 1 w/Conf, Oral Fld  3. Essential hypertension Controlled on HCTZ  4. Hemiparesis affecting right side as late effect of cerebrovascular accident (HCC) Stable, without worsening of symptoms. Continues on lipitor and ASA  - COMPLETE METABOLIC PANEL WITH GFR  5. Bipolar affective disorder, current episode hypomanic (HCC) Followed by psych at [facility], on medication but unsure what medication is, getting this from [place], encouraged to bring all medication bottles with him at follow up appt  6. Mixed hyperlipidemia -continues on lipitor - Lipid Panel - CBC with Differential/Platelets  7. Bursitis  Naproxen 220 mg by mouth twice daily for 1 week -to alternate ice/heat ~20 mins TID  -avoid pressure to area -may use muscle rub to area AFTER heat/ice if needed  Next appt: 01/22/2019, sooner if needed Shanda Bumps K. Biagio Borg  [facility] [phone]

## 2018-10-23 ENCOUNTER — Other Ambulatory Visit: Payer: Self-pay

## 2018-10-23 DIAGNOSIS — D72829 Elevated white blood cell count, unspecified: Secondary | ICD-10-CM

## 2018-10-24 ENCOUNTER — Telehealth: Payer: Self-pay

## 2018-10-24 LAB — COMPLETE METABOLIC PANEL WITH GFR
AG Ratio: 1.2 (calc) (ref 1.0–2.5)
ALKALINE PHOSPHATASE (APISO): 70 U/L (ref 40–115)
ALT: 23 U/L (ref 9–46)
AST: 32 U/L (ref 10–35)
Albumin: 4 g/dL (ref 3.6–5.1)
BILIRUBIN TOTAL: 0.4 mg/dL (ref 0.2–1.2)
BUN: 17 mg/dL (ref 7–25)
CHLORIDE: 105 mmol/L (ref 98–110)
CO2: 26 mmol/L (ref 20–32)
Calcium: 8.9 mg/dL (ref 8.6–10.3)
Creat: 0.94 mg/dL (ref 0.70–1.25)
GFR, EST AFRICAN AMERICAN: 101 mL/min/{1.73_m2} (ref >=60)
GFR, Est Non African American: 87 mL/min/{1.73_m2} (ref >=60)
GLUCOSE: 96 mg/dL (ref 65–99)
Globulin: 3.4 g/dL (calc) (ref 1.9–3.7)
POTASSIUM: 3.8 mmol/L (ref 3.5–5.3)
Sodium: 138 mmol/L (ref 135–146)
TOTAL PROTEIN: 7.4 g/dL (ref 6.1–8.1)

## 2018-10-24 LAB — CBC WITH DIFFERENTIAL/PLATELET
BASOS ABS: 116 {cells}/uL (ref 0–200)
Basophils Relative: 0.9 %
EOS ABS: 65 {cells}/uL (ref 15–500)
EOS PCT: 0.5 %
HCT: 41 % (ref 38.5–50.0)
Hemoglobin: 14.1 g/dL (ref 13.2–17.1)
Lymphs Abs: 9791 cells/uL — ABNORMAL HIGH (ref 850–3900)
MCH: 30.3 pg (ref 27.0–33.0)
MCHC: 34.4 g/dL (ref 32.0–36.0)
MCV: 88.2 fL (ref 80.0–100.0)
MONOS PCT: 5.3 %
MPV: 10.8 fL (ref 7.5–12.5)
NEUTROS ABS: 2245 {cells}/uL (ref 1500–7800)
NEUTROS PCT: 17.4 %
PLATELETS: 203 10*3/uL (ref 140–400)
RBC: 4.65 10*6/uL (ref 4.20–5.80)
RDW: 13.2 % (ref 11.0–15.0)
TOTAL LYMPHOCYTE: 75.9 %
WBC mixed population: 684 cells/uL (ref 200–950)
WBC: 12.9 10*3/uL — ABNORMAL HIGH (ref 3.8–10.8)

## 2018-10-24 LAB — DRUG TOX MONITOR 1 W/CONF, ORAL FLD
AMPHETAMINES: NEGATIVE ng/mL (ref <10)
BARBITURATES: NEGATIVE ng/mL (ref <10)
Benzodiazepines: NEGATIVE ng/mL (ref <0.50)
Buprenorphine: NEGATIVE ng/mL (ref <0.10)
CODEINE: NEGATIVE ng/mL (ref <2.5)
Cocaine: NEGATIVE ng/mL (ref <5.0)
Cotinine: 7.3 ng/mL — ABNORMAL HIGH (ref <5.0)
Dihydrocodeine: NEGATIVE ng/mL (ref <2.5)
Fentanyl: NEGATIVE ng/mL (ref <0.10)
Heroin Metabolite: NEGATIVE ng/mL (ref <1.0)
Hydrocodone: NEGATIVE ng/mL (ref <2.5)
Hydromorphone: NEGATIVE ng/mL (ref <2.5)
MARIJUANA: POSITIVE ng/mL — AB (ref <2.5)
MDMA: NEGATIVE ng/mL (ref <10)
MEPROBAMATE: NEGATIVE ng/mL (ref <2.5)
METHADONE: NEGATIVE ng/mL (ref <5.0)
Morphine: NEGATIVE ng/mL (ref <2.5)
NICOTINE METABOLITE: POSITIVE ng/mL — AB (ref <5.0)
NORHYDROCODONE: NEGATIVE ng/mL (ref <2.5)
NOROXYCODONE: 73.1 ng/mL — AB (ref <2.5)
OPIATES: POSITIVE ng/mL — AB (ref <2.5)
Oxymorphone: NEGATIVE ng/mL (ref <2.5)
Phencyclidine: NEGATIVE ng/mL (ref <10)
TAPENTADOL: NEGATIVE ng/mL (ref <5.0)
THC: 7.1 ng/mL — AB (ref <2.5)
Tramadol: NEGATIVE ng/mL (ref <5.0)
Zolpidem: NEGATIVE ng/mL (ref <5.0)

## 2018-10-24 LAB — LIPID PANEL
CHOL/HDL RATIO: 3.5 (calc) (ref <5.0)
CHOLESTEROL: 165 mg/dL (ref <200)
HDL: 47 mg/dL (ref >40)
LDL CHOLESTEROL (CALC): 98 mg/dL
NON-HDL CHOLESTEROL (CALC): 118 mg/dL (ref <130)
TRIGLYCERIDES: 105 mg/dL (ref <150)

## 2018-10-24 NOTE — Telephone Encounter (Signed)
Yes he needs to follow up on lab

## 2018-10-24 NOTE — Telephone Encounter (Signed)
Patient brought in his recent lab results from the [place]. He would like for Shanda Bumps to review the [facility] results and compare them to the labs this office drew the other day.   If the results are similar, he would like to know if he still needs to come in for the repeat CBC next week.   Results were abstracted and copies were placed in Jessica's review and sign folder.

## 2018-10-27 ENCOUNTER — Other Ambulatory Visit: Payer: Self-pay

## 2018-10-27 DIAGNOSIS — M5440 Lumbago with sciatica, unspecified side: Principal | ICD-10-CM

## 2018-10-27 DIAGNOSIS — G8929 Other chronic pain: Secondary | ICD-10-CM

## 2018-10-29 ENCOUNTER — Other Ambulatory Visit: Payer: Medicare Other

## 2018-10-30 ENCOUNTER — Other Ambulatory Visit: Payer: Medicare Other

## 2018-10-30 ENCOUNTER — Telehealth: Payer: Self-pay | Admitting: *Deleted

## 2018-10-30 DIAGNOSIS — D72829 Elevated white blood cell count, unspecified: Secondary | ICD-10-CM | POA: Diagnosis not present

## 2018-10-30 LAB — CBC WITH DIFFERENTIAL/PLATELET
BASOS ABS: 116 {cells}/uL (ref 0–200)
BASOS PCT: 1.1 %
EOS PCT: 0.6 %
Eosinophils Absolute: 63 cells/uL (ref 15–500)
HCT: 43.6 % (ref 38.5–50.0)
Hemoglobin: 15 g/dL (ref 13.2–17.1)
Lymphs Abs: 8201 cells/uL — ABNORMAL HIGH (ref 850–3900)
MCH: 30.3 pg (ref 27.0–33.0)
MCHC: 34.4 g/dL (ref 32.0–36.0)
MCV: 88.1 fL (ref 80.0–100.0)
MPV: 10.8 fL (ref 7.5–12.5)
Monocytes Relative: 5.8 %
NEUTROS ABS: 1512 {cells}/uL (ref 1500–7800)
Neutrophils Relative %: 14.4 %
Platelets: 227 10*3/uL (ref 140–400)
RBC: 4.95 10*6/uL (ref 4.20–5.80)
RDW: 13 % (ref 11.0–15.0)
Total Lymphocyte: 78.1 %
WBC mixed population: 609 cells/uL (ref 200–950)
WBC: 10.5 10*3/uL (ref 3.8–10.8)

## 2018-10-30 NOTE — Telephone Encounter (Signed)
Patient walked into office requesting to speak with Shanda BumpsJessica.  Patient stated that he tested Positive for Marijuana. Stated that he quit smoking it on Monday. Wants you to please give him another chance. Stated that his medications are more important. Stated that he cannot afford to go to the Pain clinic because they charge $50.00 every time he goes and he does not have the extra money. Stated that he is the only one who brings in income, his wife does not work. Stated that you can test him everytime he comes in if you want to. Please reconsider. Please Advise.

## 2018-10-30 NOTE — Telephone Encounter (Signed)
Patient notified and agreed. Patient couldn't thank you enough and promises you will never find anything in his system again. Patient stated he will call the office when he is due a Rx.

## 2018-10-30 NOTE — Telephone Encounter (Signed)
Spoke with Dr Renato Gailseed and at this time will continue to prescribe pain medication however if any illegal substances are found again we will not be able to continue with this.

## 2018-11-03 ENCOUNTER — Encounter: Payer: Self-pay | Admitting: Physical Medicine & Rehabilitation

## 2018-11-11 ENCOUNTER — Other Ambulatory Visit: Payer: Self-pay | Admitting: *Deleted

## 2018-11-11 DIAGNOSIS — M5440 Lumbago with sciatica, unspecified side: Secondary | ICD-10-CM

## 2018-11-11 DIAGNOSIS — G8929 Other chronic pain: Secondary | ICD-10-CM

## 2018-11-11 DIAGNOSIS — M159 Polyosteoarthritis, unspecified: Secondary | ICD-10-CM

## 2018-11-11 DIAGNOSIS — M150 Primary generalized (osteo)arthritis: Principal | ICD-10-CM

## 2018-11-11 NOTE — Telephone Encounter (Signed)
Patient requested Refill. Stated that it was due on the 29th but knew the Holiday was coming up and was unsure of closure.  [place] Database Verified LR: 10/14/2018 [facility]

## 2018-11-12 MED ORDER — OXYCODONE HCL 15 MG PO TABS: 15.0000 mg | ORAL_TABLET | Freq: Four times a day (QID) | ORAL | 0 refills | Status: DC | PRN

## 2018-11-18 DIAGNOSIS — Z029 Encounter for administrative examinations, unspecified: Secondary | ICD-10-CM

## 2018-11-27 ENCOUNTER — Ambulatory Visit: Payer: Medicare Other | Admitting: Physical Medicine & Rehabilitation

## 2018-12-04 ENCOUNTER — Telehealth: Payer: Self-pay | Admitting: *Deleted

## 2018-12-04 NOTE — Telephone Encounter (Signed)
Patient called and left message on Clinical intake stating that he was calling to see if he could get his Rx on the 24th since the Holiday.   NCCSRS Database Verified.  LR: 11/12/2018  Tried calling patient to let him know we will be open on 12/11/18 and to call that morning for the Pain Rx Refill. Mailbox is full and cannot leave message.

## 2018-12-08 ENCOUNTER — Other Ambulatory Visit: Payer: Self-pay | Admitting: *Deleted

## 2018-12-08 DIAGNOSIS — M150 Primary generalized (osteo)arthritis: Principal | ICD-10-CM

## 2018-12-08 DIAGNOSIS — M5440 Lumbago with sciatica, unspecified side: Secondary | ICD-10-CM

## 2018-12-08 DIAGNOSIS — M159 Polyosteoarthritis, unspecified: Secondary | ICD-10-CM

## 2018-12-08 DIAGNOSIS — G8929 Other chronic pain: Secondary | ICD-10-CM

## 2018-12-08 NOTE — Telephone Encounter (Signed)
Patient requested refill. Stated that it is early but he is going out of town for Christmas and it will be due before he comes back.  [place]CCSRS Database Verified LR: 11/12/2018 [facility]

## 2018-12-09 ENCOUNTER — Other Ambulatory Visit: Payer: Self-pay

## 2018-12-09 DIAGNOSIS — M150 Primary generalized (osteo)arthritis: Principal | ICD-10-CM

## 2018-12-09 DIAGNOSIS — M5440 Lumbago with sciatica, unspecified side: Secondary | ICD-10-CM

## 2018-12-09 DIAGNOSIS — G8929 Other chronic pain: Secondary | ICD-10-CM

## 2018-12-09 DIAGNOSIS — M159 Polyosteoarthritis, unspecified: Secondary | ICD-10-CM

## 2018-12-09 MED ORDER — OXYCODONE HCL 15 MG PO TABS: 15.0000 mg | ORAL_TABLET | Freq: Four times a day (QID) | ORAL | 0 refills | Status: DC | PRN

## 2018-12-09 NOTE — Telephone Encounter (Signed)
Patient aware rx sent  

## 2018-12-09 NOTE — Telephone Encounter (Signed)
[place] Database verified and compliance confirmed on 12/04/18 by Synetta FailAnita  Patient called stating he is going out of town tomorrow for the holidays and would like to know if [facility]    Please advise

## 2018-12-26 ENCOUNTER — Ambulatory Visit: Payer: Medicare Other | Admitting: Physical Medicine & Rehabilitation

## 2019-01-09 ENCOUNTER — Other Ambulatory Visit: Payer: Self-pay | Admitting: *Deleted

## 2019-01-09 DIAGNOSIS — M5440 Lumbago with sciatica, unspecified side: Secondary | ICD-10-CM

## 2019-01-09 DIAGNOSIS — G8929 Other chronic pain: Secondary | ICD-10-CM

## 2019-01-09 DIAGNOSIS — M150 Primary generalized (osteo)arthritis: Principal | ICD-10-CM

## 2019-01-09 DIAGNOSIS — M159 Polyosteoarthritis, unspecified: Secondary | ICD-10-CM

## 2019-01-09 MED ORDER — OXYCODONE HCL 15 MG PO TABS: 15.0000 mg | ORAL_TABLET | Freq: Four times a day (QID) | ORAL | 0 refills | Status: DC | PRN

## 2019-01-09 NOTE — Telephone Encounter (Signed)
Patient requested refill [place] Database Verified LR: 12/12/2018 Pended Rx and sent to [facility] for approval .

## 2019-01-13 ENCOUNTER — Encounter: Payer: Medicare Other | Attending: Physical Medicine & Rehabilitation

## 2019-01-13 ENCOUNTER — Ambulatory Visit: Payer: Medicare Other | Admitting: Physical Medicine & Rehabilitation

## 2019-01-13 ENCOUNTER — Encounter: Payer: Self-pay | Admitting: Physical Medicine & Rehabilitation

## 2019-01-13 ENCOUNTER — Other Ambulatory Visit: Payer: Self-pay

## 2019-01-13 VITALS — BP 107/71 | HR 78 | Ht 71.0 in | Wt 163.0 lb

## 2019-01-13 DIAGNOSIS — M5441 Lumbago with sciatica, right side: Secondary | ICD-10-CM

## 2019-01-13 DIAGNOSIS — I69351 Hemiplegia and hemiparesis following cerebral infarction affecting right dominant side: Secondary | ICD-10-CM | POA: Insufficient documentation

## 2019-01-13 DIAGNOSIS — G8929 Other chronic pain: Secondary | ICD-10-CM

## 2019-01-13 DIAGNOSIS — M5442 Lumbago with sciatica, left side: Secondary | ICD-10-CM | POA: Diagnosis not present

## 2019-01-13 NOTE — Progress Notes (Signed)
Subjective:    Patient ID: Nathan Buchanan, male    DOB: Apr 15, 1956, 63 y.o.   MRN: [id number]  HPI CC:  Low back pain radiating to the Left hip  63 year old male referred for physical medicine rehabilitation evaluation of a patient with chronic low back pain since 2001.  He indicates that he injured his back while working.  He has not had any spine surgery.  He is sought medical attention at the [facility] but is currently seeing his primary physician at [facility] as well as [facility] Pt states he has been on disability for degenerative disc.  Since his stroke his wife indicates that the patient has had some problems with mobility particularly moving the right leg.  Patient thinks that he may have gotten a little bit worse with his back and hip pain since the stroke  His back pain gets worse with walking or prolonged standing.  He does have some right lateral hip pain with side-lying.  He denies any shooting pains down the right leg  Stroke in 2015 with right sided weakness, left subcortical infarct   Dressing and bathing Mod I although he is very slow with dressing and his wife actually helped him get his jacket on after the examination.  Home therapy after the stroke, no outpatient therapy.  Patient has had some chronic lower extremity weakness after the stroke in 2015 but no worsening since that time  No  Bowel or bladder issues   Past medical history significant for alcohol abuse as well as history of cannabis use  Chronic oxycodone 15 mg 4 times daily  Pain Inventory Average Pain 9 Pain Right Now 9 My pain is sharp, dull, stabbing and aching  In the last 24 hours, has pain interfered with the following? General activity 10 Relation with others 10 Enjoyment of life 10 What TIME of day is your pain at its worst? varies Sleep (in general) Fair  Pain is worse with: walking, bending, standing and some activites Pain improves with: medication Relief from Meds:  2  Mobility walk without assistance walk with assistance use a cane how many minutes can you walk? 3 ability to climb steps?  yes do you drive?  yes  Function Do you have any goals in this area?  yes  Neuro/Psych trouble walking  Prior Studies Any changes since last visit?  no   EXAM: CT HEAD WITHOUT CONTRAST  TECHNIQUE: Contiguous axial images were obtained from the base of the skull through the vertex without intravenous contrast.  COMPARISON:  June 24, 2008  FINDINGS: The ventricles are normal in size and configuration. There is no intracranial mass, hemorrhage, extra-axial fluid collection, or midline shift. There is mild small vessel disease in the centra semiovale bilaterally. There is decreased attenuation in the anterior left putamen, concerning for a small acute infarct. There is a nearby small focus of decreased attenuation in the medial anterior left centrum semiovale which also may represent an acute small infarct. There is also a new focus of decreased attenuation in the lateral right dentate nucleus of the cerebellum. This area also could represent recent infarct.  Bony calvarium appears intact.  The mastoid air cells are clear.  IMPRESSION: Mild periventricular small vessel disease. New decreased attenuation anterior left putamen and medial anterior inferior left centrum semiovale. There is also in a small focus of decreased attenuation in the lateral right dentate nucleus of the cerebellum. These areas concerning for potential small acute infarcts. There is no  hemorrhage or mass effect.   Electronically Signed   By: Bretta Bang M.D.   On: 12/04/2014 11:00   Clinical Data:  Low back pain.  There is some radiation to the hips bilaterally.   MYELOGRAM INJECTION   Technique:  Informed consent was obtained from the patient prior to the procedure, including potential complications of headache, allergy, infection and pain.  A timeout  procedure was performed. With the patient prone, the lower back was prepped with Betadine. 1% Lidocaine was used for local anesthesia.  Lumbar puncture was performed at the right paramidline L2-3 level using a 22 gauge needle with return of clear CSF.  15 ml of Omnipaque 180was injected into the subarachnoid space .   IMPRESSION: Successful injection of  intrathecal contrast for myelography.   MYELOGRAM LUMBAR   Technique:  Following injection of intrathecal Omnipaque contrast, spine imaging in multiple projections was performed using fluoroscopy.   Fluoroscopy Time: 1.09 minutes.   Comparison: CT myelogram 07/04/2007.   Findings: Mild disc bulging at L4-5 is again noted.  There is slight impact on the lateral recess bilaterally, worse on the right.  The nerve roots otherwise fill normally.  There is some loss of disc height at both L4-5 and L5-S1 with disc bulging noted on the lateral views.   As on the prior study, there is slight exaggeration of the disc bulge upon standing.  Alignment is stable with flexion and extension.  There is no significant further exaggeration of the disc bulging.   IMPRESSION:   1.  Stable disc bulging at L4-5 and L5-S1 with stable loss of disc height at L5 S1. 2.  Mild lateral recess narrowing at L4-5 is stable, worse on the right.     CT MYELOGRAPHY LUMBAR SPINE   Technique:  CT imaging of the lumbar spine was performed after intrathecal contrast administration.  Multiplanar CT image reconstructions were also generated.   Findings:  The lumbar spine is imaged from midbody of T12 through the midbody of S2.  The conus medullaris terminates at L1.  The vertebral body heights alignment maintained.  There is loss of disc height at L4-5 and at L5-S1, similar to the prior study.  The individual disc levels are as follows.   The disc levels at L2-3 above are normal.   L3-4:  Mild disc bulging is stable.  No focal stenosis is evident.     L4-5:  Mild broad-based disc bulging has not significantly changed in the interval.  There is slight encroachment on the lateral recess bilaterally.  Minimal foraminal narrowing is unchanged.   L5-S1:  End plate osteophytes are noted.  There is mild broad-based disc bulging.  There is no significant interval change.  Facet hypertrophy is stable.   IMPRESSION:   1.  Disc bulging and mild lateral recess narrowing at L4-5 is stable. 2.  Mild osseous foraminal narrowing bilaterally at L5-S1 is stable. 3.  No significant change in loss of disc height at L5-S1 and posterior osteophyte formation.  Provider: Sheilah Mins [facility] involved in your care Any changes since last visit?  no   Family History  Problem Relation Age of Onset  . Hypertension Mother   . Diabetes Mother   . Stroke Father   . Cancer Father        colon  . Diabetes Sister   . Diabetes Sister   . Diabetes Brother    Social History   Socioeconomic History  . Marital status: Married    Spouse  name: Not on file  . Number of children: Not on file  . Years of education: Not on file  . Highest education level: Not on file  Occupational History  . Not on file  Social Needs  . Financial resource strain: Not hard at all  . Food insecurity:    Worry: Never true    Inability: Never true  . Transportation needs:    Medical: No    Non-medical: No  Tobacco Use  . Smoking status: Former Smoker    Years: 5.00    Last attempt to quit: 12/17/2013    Years since quitting: 5.0  . Smokeless tobacco: Never Used  Substance and Sexual Activity  . Alcohol use: Yes    Alcohol/week: 0.0 standard drinks    Comment: wine rare occ.  . Drug use: Yes    Types: Marijuana    Comment: a joint a day to ease pain  . Sexual activity: Yes  Lifestyle  . Physical activity:    Days per week: 0 days    Minutes per session: 0 min  . Stress: Only a little  Relationships  . Social connections:    Talks on phone: More than  three times a week    Gets together: Never    Attends religious service: More than 4 times per year    Active member of club or organization: No    Attends meetings of clubs or organizations: Never    Relationship status: Married  Other Topics Concern  . Not on file  Social History Narrative  . Not on file   Past Surgical History:  Procedure Laterality Date  . ABCESS DRAINAGE     Gluteal MRSA drainage   . ANAL FISSURE REPAIR  2008  . COLONOSCOPY  2008   Dr.Edwards, Internal Hemorrhoids   . CYSTOSCOPY N/A 01/24/2015   Procedure: CYSTOSCOPY FLEXIBLE;  Surgeon: Valetta Fulleravid S Grapey, MD;  Location: [facility];  Service: Urology;  Laterality: N/A;  . PENILE PROSTHESIS IMPLANT  04/12/2010   Dr.Grapey, Insertion of 3 peice penile prosthesis   . PENILE PROSTHESIS IMPLANT N/A 01/24/2015   Procedure: EXPLANTATION OF PENILE PROSTHESIS;  Surgeon: Valetta Fulleravid S Grapey, MD;  Location: [facility];  Service: Urology;  Laterality: N/A;   Past Medical History:  Diagnosis Date  . Arthritis   . Back ache   . Bipolar I disorder, most recent episode (or current) unspecified    no meds at present  . Chronic hepatitis C without mention of hepatic coma    tx. with meds/ was told" was cured"  . Cirrhosis of liver without mention of alcohol   . Dermatophytosis of the body   . Generalized osteoarthrosis, involving multiple sites   . Hyperlipidemia   . Hypertension   . Osteoarthrosis, unspecified whether generalized or localized, unspecified site   . Stroke [facility](HCC)     12'15 -Right sided weakness remains  . Unspecified glaucoma(365.9)   . Urinary frequency    BP 107/71   Pulse 78   Ht 5\' 11"  (1.803 m)   Wt 163 lb (73.9 kg)   SpO2 94%   BMI 22.73 kg/m   Opioid Risk Score:   Fall Risk Score:  `1  Depression screen PHQ 2/9  [facility] 07/18/2018 07/14/2018 01/10/2017 09/12/2016 09/09/2015 07/13/2015 07/13/2015  Decreased Interest 0 0 0 2 0 - 0  Down, Depressed, Hopeless 0 0 0 2 0 2 0  PHQ - 2 Score 0 0 0 4  0 2 0  Altered sleeping - - - - - 3 -  Tired, decreased energy - - - - - 2 -  Change in appetite - - - - - 1 -  Feeling bad or failure about yourself  - - - - - 1 -  Trouble concentrating - - - - - 1 -  Moving slowly or fidgety/restless - - - - - 2 -  Suicidal thoughts - - - - - 0 -  PHQ-9 Score - - - - - 12 -    Review of Systems  Constitutional: Negative.   HENT: Negative.   Eyes: Negative.   Respiratory: Negative.   Cardiovascular: Negative.   Gastrointestinal: Negative.   Endocrine: Negative.   Genitourinary: Negative.   Musculoskeletal: Negative.   Skin: Negative.   Allergic/Immunologic: Negative.   Neurological: Negative.   Hematological: Negative.   Psychiatric/Behavioral: Negative.   All other systems reviewed and are negative.      Objective:   Physical Exam Vitals signs and nursing note reviewed.  Constitutional:      Appearance: Normal appearance.  HENT:     Head: Normocephalic and atraumatic.     Nose: Nose normal.     Mouth/Throat:     Mouth: Mucous membranes are moist.  Eyes:     Extraocular Movements: Extraocular movements intact.     Pupils: Pupils are equal, round, and reactive to light.  Neck:     Musculoskeletal: Normal range of motion.  Cardiovascular:     Rate and Rhythm: Normal rate and regular rhythm.     Heart sounds: Normal heart sounds. No murmur.  Pulmonary:     Effort: Pulmonary effort is normal. No respiratory distress.     Breath sounds: Normal breath sounds. No stridor.  Abdominal:     General: Abdomen is flat. Bowel sounds are normal. There is no distension.     Palpations: Abdomen is soft. There is no mass.  Musculoskeletal:     Lumbar back: He exhibits decreased range of motion and tenderness. He exhibits no deformity.     Comments: Patient has 50% lumbar flexion 25% lumbar extension accompanied by pain lateral bending is 50% without pain. Negative straight leg raising  Skin:    General: Skin is warm and dry.  Neurological:      Mental Status: He is alert and oriented to person, place, and time.     Motor: Weakness present.     Coordination: Coordination abnormal. Heel to [facility] Test abnormal. Impaired rapid alternating movements.     Gait: Gait abnormal.     Deep Tendon Reflexes: Reflexes abnormal.     Reflex Scores:      Tricep reflexes are 3+ on the right side and 1+ on the left side.      Bicep reflexes are 3+ on the right side and 1+ on the left side.      Brachioradialis reflexes are 3+ on the right side and 1+ on the left side.      Patellar reflexes are 3+ on the right side and 2+ on the left side.      Achilles reflexes are 3+ on the right side and 1+ on the left side.    Comments: Patient has delayed processing, increased latency of response to questioning. Motor strength is 4/5 in the right deltoid bicep tricep grip hip flexors knee extensors ankle dorsiflex 3/5 Left side is 5/5 in the deltoid bicep tricep grip hip flexor knee extensor sensation is diminished to prick right foot and  ankle area normal on the left.  Normal light touch sensation bilateral upper and lower limbs.  Abnormal finger-nose-finger on the right side as well as heel-to-shin on the right side decreased rapid alternating pronation supination of the right upper extremity.  Psychiatric:        Attention and Perception: He is inattentive.        Mood and Affect: Affect is blunt and flat.        Speech: Speech is delayed.        Behavior: Behavior is slowed.        Thought Content: Thought content normal.        Cognition and Memory: Cognition is impaired.           Assessment & Plan:  1.  Chronic low back pain he has no signs of radiculopathy his right lower extremity sensory and motor deficits are likely related to his stroke given that he has similar deficits in the right upper.  He also has hyperreflexia on the right side rather than loss of reflexes. Given his history of pain with walking and standing I suspect he has lumbar  facet arthropathy will order x-rays can be performed in [facility] imaging.  I discussed this with the patient's wife. Also he would be a good candidate for lumbar medial branch blocks and explained this to the patient.  The patient's wife gets the same injections at [facility] orth[facility] and she also was explaining these to the patient.  Once his pain is under better control he may benefit from some therapy this would be not only for core strengthening but also for his gait deviation related to his stroke.  2.  Chronic right hemiparesis secondary to left subcortical infarct.  He does have decreased stance phase on the right side with asymmetric gait this may be causing some additional lumbar strain.  He may benefit from evaluation at neuro rehab however I would wait until after the retroflexion performed.  Patient has cognitive deficits that may be related to subcortical white matter disease plus minus opiates.  Discussed with patient wife that we will not be doing any medication management with this patient need to continue seeing his other physicians as he already has.

## 2019-01-22 ENCOUNTER — Ambulatory Visit (INDEPENDENT_AMBULATORY_CARE_PROVIDER_SITE_OTHER): Payer: Medicare Other | Admitting: Nurse Practitioner

## 2019-01-22 ENCOUNTER — Ambulatory Visit: Payer: Medicare Other | Admitting: Nurse Practitioner

## 2019-01-22 ENCOUNTER — Ambulatory Visit
Admission: RE | Admit: 2019-01-22 | Discharge: 2019-01-22 | Disposition: A | Discharge status: Home / Self Care | Payer: Medicare Other | Source: Ambulatory Visit | Attending: Physical Medicine & Rehabilitation | Admitting: Physical Medicine & Rehabilitation

## 2019-01-22 ENCOUNTER — Encounter: Payer: Self-pay | Admitting: Nurse Practitioner

## 2019-01-22 VITALS — BP 118/82 | HR 76 | Temp 97.7°F | Ht 71.0 in | Wt 163.8 lb

## 2019-01-22 DIAGNOSIS — M48061 Spinal stenosis, lumbar region without neurogenic claudication: Secondary | ICD-10-CM | POA: Diagnosis not present

## 2019-01-22 DIAGNOSIS — I69351 Hemiplegia and hemiparesis following cerebral infarction affecting right dominant side: Secondary | ICD-10-CM | POA: Diagnosis not present

## 2019-01-22 DIAGNOSIS — E782 Mixed hyperlipidemia: Secondary | ICD-10-CM

## 2019-01-22 DIAGNOSIS — M159 Polyosteoarthritis, unspecified: Secondary | ICD-10-CM

## 2019-01-22 DIAGNOSIS — I10 Essential (primary) hypertension: Secondary | ICD-10-CM | POA: Diagnosis not present

## 2019-01-22 DIAGNOSIS — D72829 Elevated white blood cell count, unspecified: Secondary | ICD-10-CM

## 2019-01-22 DIAGNOSIS — Z79899 Other long term (current) drug therapy: Secondary | ICD-10-CM

## 2019-01-22 DIAGNOSIS — G8929 Other chronic pain: Secondary | ICD-10-CM

## 2019-01-22 DIAGNOSIS — M150 Primary generalized (osteo)arthritis: Secondary | ICD-10-CM

## 2019-01-22 DIAGNOSIS — M5440 Lumbago with sciatica, unspecified side: Secondary | ICD-10-CM

## 2019-01-22 DIAGNOSIS — M5442 Lumbago with sciatica, left side: Principal | ICD-10-CM

## 2019-01-22 DIAGNOSIS — M5441 Lumbago with sciatica, right side: Principal | ICD-10-CM

## 2019-01-22 NOTE — Progress Notes (Signed)
Careteam: Patient Care Team: Sharon Seller, NP as PCP - General (Geriatric Medicine) Marvel Plan, MD as Consulting Physician (Neurology)  Advanced Directive information Does Patient Have a Medical Advance Directive?: No, Would patient like information on creating a medical advance directive?: Yes (ED - Information included in AVS)  No Known Allergies  Chief Complaint  Patient presents with  . Medical Management of Chronic Issues    4 Month follow up,moderate fall risk     HPI: Patient is a 63 y.o. male seen in the office today for routine follow up.    Chronic low back pain- following with physical medicine due to this. Going for further imagining next week.   HTN- controlled on HCTZ  OA- multiple joints. Taking oxycodone for pain control. Taking 15 mg every 6 hours as needed for severe pain. No side effects   Substance abuse- has stopped smoking marijuana. Vaping with CBD oil.    Hyperlipidemia - taking lipitor 20 mg for cholesterol. LDL 98 goal <70 Taking half tablet of lipitor - but has not been refill from here since 2016 but states he gets it from the [place]. Did not bring bottles today  Review of Systems:  Review of Systems  Constitutional: Negative for chills, fever and weight loss.  HENT: Negative for tinnitus.   Respiratory: Negative for cough, sputum production and shortness of breath.   Cardiovascular: Negative for chest pain, palpitations and leg swelling.  Gastrointestinal: Positive for constipation (controlled on softener ). Negative for abdominal pain, diarrhea and heartburn.  Genitourinary: Negative for dysuria, frequency and urgency.  Musculoskeletal: Positive for back pain, joint pain and myalgias. Negative for falls.  Skin: Negative.   Neurological: Negative for dizziness and headaches.  Psychiatric/Behavioral: Positive for depression and memory loss. The patient is nervous/anxious. The patient does not have insomnia.     Past Medical History:    Diagnosis Date  . Arthritis   . Back ache   . Bipolar I disorder, most recent episode (or current) unspecified    no meds at present  . Chronic hepatitis C without mention of hepatic coma    tx. with meds/ was told" was cured"  . Cirrhosis of liver without mention of alcohol   . Dermatophytosis of the body   . Generalized osteoarthrosis, involving multiple sites   . Hyperlipidemia   . Hypertension   . Osteoarthrosis, unspecified whether generalized or localized, unspecified site   . Stroke [facility])     12'15 -Right sided weakness remains  . Unspecified glaucoma(365.9)   . Urinary frequency    Past Surgical History:  Procedure Laterality Date  . ABCESS DRAINAGE     Gluteal MRSA drainage   . ANAL FISSURE REPAIR  2008  . COLONOSCOPY  2008   Dr.Edwards, Internal Hemorrhoids   . CYSTOSCOPY N/A 01/24/2015   Procedure: CYSTOSCOPY FLEXIBLE;  Surgeon: Valetta Fuller, MD;  Location: [facility];  Service: Urology;  Laterality: N/A;  . PENILE PROSTHESIS IMPLANT  04/12/2010   Dr.Grapey, Insertion of 3 peice penile prosthesis   . PENILE PROSTHESIS IMPLANT N/A 01/24/2015   Procedure: EXPLANTATION OF PENILE PROSTHESIS;  Surgeon: Valetta Fuller, MD;  Location: [facility];  Service: Urology;  Laterality: N/A;   Social History:   reports that he quit smoking about 5 years ago. He quit after 5.00 years of use. He has never used smokeless tobacco. He reports current alcohol use. He reports current drug use. Drug: Marijuana.  Family History  Problem Relation Age of  Onset  . Hypertension Mother   . Diabetes Mother   . Stroke Father   . Cancer Father        colon  . Diabetes Sister   . Diabetes Sister   . Diabetes Brother     Medications: Patient's Medications  New Prescriptions   No medications on file  Previous Medications   ASPIRIN EC 325 MG TABLET    Take 1 tablet (325 mg total) by mouth daily.   ATORVASTATIN (LIPITOR) 20 MG TABLET    Take 1 tablet (20 mg total) by mouth daily at 6 PM. For  Cholesterol   BIMATOPROST (LUMIGAN) 0.03 % OPHTHALMIC SOLUTION    Place 1 drop into both eyes at bedtime.   CLOTRIMAZOLE-BETAMETHASONE (LOTRISONE) CREAM    apply to affected area twice a day   DOCUSATE SODIUM (DSS) 100 MG CAPS    Take 100 mg by mouth 2 (two) times daily.   HYDROCHLOROTHIAZIDE (HYDRODIURIL) 50 MG TABLET    Take 1 tablet (50 mg total) by mouth daily with breakfast.   OXYCODONE (ROXICODONE) 15 MG IMMEDIATE RELEASE TABLET    Take 1 tablet (15 mg total) by mouth every 6 (six) hours as needed for pain.  Modified Medications   No medications on file  Discontinued Medications   No medications on file     Physical Exam:  Vitals:   01/22/19 1036  BP: 118/82  Pulse: 76  Temp: 97.7 F (36.5 C)  TempSrc: Oral  SpO2: 99%  Weight: 163 lb 12.8 oz (74.3 kg)  Height: 5\' 11"  (1.803 m)   Body mass index is 22.85 kg/m.  Physical Exam Constitutional:      Appearance: Normal appearance. He is well-developed.  HENT:     Head:     Comments: Right sided facial drop   Eyes:     General: No scleral icterus.    Pupils: Pupils are equal, round, and reactive to light.  Neck:     Musculoskeletal: Neck supple.     Thyroid: No thyromegaly.     Vascular: No carotid bruit.  Cardiovascular:     Rate and Rhythm: Normal rate and regular rhythm.     Heart sounds: Murmur (1/6 SEM) present. No friction rub. No gallop.      Comments: No distal LE edema. No calf TTP Pulmonary:     Effort: Pulmonary effort is normal.     Breath sounds: Normal breath sounds. No wheezing or rales.  Chest:     Chest wall: No tenderness.  Abdominal:     General: Bowel sounds are normal. There is no distension or abdominal bruit.     Palpations: Abdomen is soft. Abdomen is not rigid. There is no hepatomegaly, mass or pulsatile mass.     Tenderness: There is no abdominal tenderness. There is no guarding or rebound.     Hernia: No hernia is present.  Musculoskeletal:        General: Deformity present.    Lymphadenopathy:     Cervical: No cervical adenopathy.  Skin:    General: Skin is warm and dry.     Findings: No rash.  Neurological:     Mental Status: He is alert and oriented to person, place, and time.     Gait: Gait abnormal.     Comments: Right hemiparesis  Psychiatric:        Behavior: Behavior normal.        Thought Content: Thought content normal.     Labs reviewed: Basic Metabolic  Panel: Recent Labs    04/22/18 0838 07/14/18 0912 10/15/18 10/22/18 0914  NA 140 138 139 138  K 3.9 4.2 3.6 3.8  CL 108 106  --  105  CO2  --  26  --  26  GLUCOSE 96 93  --  96  BUN 13 22 16 17   CREATININE 0.80 0.99 1.1 0.94  CALCIUM  --  9.2  --  8.9  TSH  --  2.64 2.96  --    Liver Function Tests: Recent Labs    07/14/18 0912 10/15/18 10/22/18 0914  AST 30 28 32  ALT 23 30 23   ALKPHOS  --  79  --   BILITOT 0.4  --  0.4  PROT 7.5  --  7.4   No results for input(s): LIPASE, AMYLASE in the last 8760 hours. No results for input(s): AMMONIA in the last 8760 hours. CBC: Recent Labs    07/14/18 0912 10/15/18 10/22/18 0914 10/30/18 0903  WBC 10.1 7.9 12.9* 10.5  NEUTROABS 1,555  --  2,245 1,512  HGB 14.5 13.7 14.1 15.0  HCT 43.0 41 41.0 43.6  MCV 90.1  --  88.2 88.1  PLT 187 205 203 227   Lipid Panel: Recent Labs    07/14/18 0912 10/15/18 10/22/18 0914  CHOL 171 169 165  HDL 43 54 47  LDLCALC 98 78 98  TRIG 204* 186* 105  CHOLHDL 4.0  --  3.5   TSH: Recent Labs    07/14/18 0912 10/15/18  TSH 2.64 2.96   A1C: Lab Results  Component Value Date   HGBA1C 5.5 10/15/2018     Assessment/Plan 1. Chronic left-sided low back pain with sciatica, sciatica laterality unspecified -following with physical medicine. Continues on oxycodone.   - Drug Tox Monitor 1 w/Conf, Oral Fld  2. Leukocytosis, unspecified type Improved, no signs of infection. Will follow up CBC at this time.  - CBC with Differential/Platelets  3. Primary osteoarthritis involving multiple  joints Ongoing, continues on oxycodone for pain management  - Drug Tox Monitor 1 w/Conf, Oral Fld  4. Hemiparesis affecting right side as late effect of cerebrovascular accident [facility](HCC) -reports worsening gait, has gone through PT in the past with good results. Offered PT but states he will get this through [facility] -continues on ASA - Lipid Panel - COMPLETE METABOLIC PANEL WITH GFR  5. Essential hypertension Stable, will continue HCTZ  6. High risk medication use - Drug Tox Monitor 1 w/Conf, Oral Fld  7. Mixed hyperlipidemia Pt reports he is taking lipitor 20 mg 1/2 tablet, getting through [facility], will follow up lab today, LDL not at goal on last lab.  - Lipid Panel - COMPLETE METABOLIC PANEL WITH GFR  Next appt: 3 months Kirstein Baxley K. Biagio BorgEubanks, AGNP  [facility] [phone]

## 2019-01-23 ENCOUNTER — Encounter: Payer: Self-pay | Admitting: Nurse Practitioner

## 2019-01-26 LAB — DRUG TOX MONITOR 1 W/CONF, ORAL FLD
Amphetamines: NEGATIVE ng/mL (ref <10)
BARBITURATES: NEGATIVE ng/mL (ref <10)
Benzodiazepines: NEGATIVE ng/mL (ref <0.50)
Buprenorphine: NEGATIVE ng/mL (ref <0.10)
Cocaine: NEGATIVE ng/mL (ref <5.0)
Codeine: NEGATIVE ng/mL (ref <2.5)
Dihydrocodeine: NEGATIVE ng/mL (ref <2.5)
FENTANYL: NEGATIVE ng/mL (ref <0.10)
HYDROCODONE: NEGATIVE ng/mL (ref <2.5)
Heroin Metabolite: NEGATIVE ng/mL (ref <1.0)
Hydromorphone: NEGATIVE ng/mL (ref <2.5)
MARIJUANA: NEGATIVE ng/mL (ref <2.5)
MDMA: NEGATIVE ng/mL (ref <10)
Meprobamate: NEGATIVE ng/mL (ref <2.5)
Methadone: NEGATIVE ng/mL (ref <5.0)
Morphine: NEGATIVE ng/mL (ref <2.5)
NORHYDROCODONE: NEGATIVE ng/mL (ref <2.5)
Nicotine Metabolite: NEGATIVE ng/mL (ref <5.0)
Noroxycodone: 28.7 ng/mL — ABNORMAL HIGH (ref <2.5)
OXYMORPHONE: NEGATIVE ng/mL (ref <2.5)
Opiates: POSITIVE ng/mL — AB (ref <2.5)
Oxycodone: 63.1 ng/mL — ABNORMAL HIGH (ref <2.5)
Phencyclidine: NEGATIVE ng/mL (ref <10)
Tapentadol: NEGATIVE ng/mL (ref <5.0)
Tramadol: NEGATIVE ng/mL (ref <5.0)
ZOLPIDEM: NEGATIVE ng/mL (ref <5.0)

## 2019-01-26 LAB — COMPLETE METABOLIC PANEL WITH GFR
AG Ratio: 1.1 (calc) (ref 1.0–2.5)
ALKALINE PHOSPHATASE (APISO): 65 U/L (ref 35–144)
ALT: 28 U/L (ref 9–46)
AST: 32 U/L (ref 10–35)
Albumin: 4.1 g/dL (ref 3.6–5.1)
BUN: 22 mg/dL (ref 7–25)
CO2: 29 mmol/L (ref 20–32)
CREATININE: 1.04 mg/dL (ref 0.70–1.25)
Calcium: 9.2 mg/dL (ref 8.6–10.3)
Chloride: 103 mmol/L (ref 98–110)
GFR, Est African American: 89 mL/min/{1.73_m2} (ref >=60)
GFR, Est Non African American: 77 mL/min/{1.73_m2} (ref >=60)
GLOBULIN: 3.7 g/dL (ref 1.9–3.7)
Glucose, Bld: 89 mg/dL (ref 65–99)
POTASSIUM: 4.2 mmol/L (ref 3.5–5.3)
SODIUM: 139 mmol/L (ref 135–146)
Total Bilirubin: 0.5 mg/dL (ref 0.2–1.2)
Total Protein: 7.8 g/dL (ref 6.1–8.1)

## 2019-01-26 LAB — CBC WITH DIFFERENTIAL/PLATELET
ABSOLUTE MONOCYTES: 936 {cells}/uL (ref 200–950)
BASOS PCT: 0.8 %
Basophils Absolute: 104 cells/uL (ref 0–200)
Eosinophils Absolute: 117 cells/uL (ref 15–500)
Eosinophils Relative: 0.9 %
HCT: 41.1 % (ref 38.5–50.0)
Hemoglobin: 14 g/dL (ref 13.2–17.1)
Lymphs Abs: 10127 cells/uL — ABNORMAL HIGH (ref 850–3900)
MCH: 30.8 pg (ref 27.0–33.0)
MCHC: 34.1 g/dL (ref 32.0–36.0)
MCV: 90.5 fL (ref 80.0–100.0)
MONOS PCT: 7.2 %
MPV: 10.5 fL (ref 7.5–12.5)
Neutro Abs: 1716 cells/uL (ref 1500–7800)
Neutrophils Relative %: 13.2 %
PLATELETS: 199 10*3/uL (ref 140–400)
RBC: 4.54 10*6/uL (ref 4.20–5.80)
RDW: 13.1 % (ref 11.0–15.0)
TOTAL LYMPHOCYTE: 77.9 %
WBC: 13 10*3/uL — ABNORMAL HIGH (ref 3.8–10.8)

## 2019-01-26 LAB — LIPID PANEL
CHOL/HDL RATIO: 3.6 (calc) (ref <5.0)
Cholesterol: 191 mg/dL (ref <200)
HDL: 53 mg/dL (ref >=40)
LDL CHOLESTEROL (CALC): 113 mg/dL — AB
NON-HDL CHOLESTEROL (CALC): 138 mg/dL — AB (ref <130)
Triglycerides: 141 mg/dL (ref <150)

## 2019-01-28 ENCOUNTER — Telehealth: Payer: Self-pay | Admitting: *Deleted

## 2019-01-28 ENCOUNTER — Other Ambulatory Visit: Payer: Self-pay

## 2019-01-28 DIAGNOSIS — E782 Mixed hyperlipidemia: Secondary | ICD-10-CM

## 2019-01-28 NOTE — Telephone Encounter (Signed)
-----   Message from Erick Colace, MD sent at 01/27/2019  5:26 PM EST ----- Result of x-ray is arthritic changes in the facet joints L4-5 and L5-S1.  Patient would be a good candidate for L3-L4 medial branch and L5 dorsal ramus injections may schedule for bilateral procedures [id number],M54.5

## 2019-01-28 NOTE — Telephone Encounter (Signed)
Left message to call back  

## 2019-01-29 ENCOUNTER — Encounter: Payer: Self-pay | Admitting: Physical Medicine & Rehabilitation

## 2019-01-29 ENCOUNTER — Ambulatory Visit (HOSPITAL_BASED_OUTPATIENT_CLINIC_OR_DEPARTMENT_OTHER): Payer: Medicare Other | Admitting: Physical Medicine & Rehabilitation

## 2019-01-29 ENCOUNTER — Encounter: Payer: Medicare Other | Attending: Physical Medicine & Rehabilitation

## 2019-01-29 DIAGNOSIS — I69351 Hemiplegia and hemiparesis following cerebral infarction affecting right dominant side: Secondary | ICD-10-CM | POA: Insufficient documentation

## 2019-01-29 DIAGNOSIS — M47816 Spondylosis without myelopathy or radiculopathy, lumbar region: Secondary | ICD-10-CM

## 2019-01-29 DIAGNOSIS — G8929 Other chronic pain: Secondary | ICD-10-CM | POA: Diagnosis not present

## 2019-01-29 DIAGNOSIS — M5441 Lumbago with sciatica, right side: Secondary | ICD-10-CM | POA: Diagnosis not present

## 2019-01-29 DIAGNOSIS — M5442 Lumbago with sciatica, left side: Secondary | ICD-10-CM | POA: Diagnosis not present

## 2019-01-29 NOTE — Patient Instructions (Signed)

## 2019-01-29 NOTE — Progress Notes (Signed)
  PROCEDURE RECORD [facility]   Name: Nathan Buchanan DOB:1956-10-27 MRN: [id number]  Date:01/29/2019  Physician: Claudette Laws, MD    Nurse/CMA: Degan Hanser CMA  Allergies: No Known Allergies  Consent Signed: Yes.    Is patient diabetic? No.  CBG today? NA  Pregnant: No. LMP: No LMP for male patient. (age 63-55)  Anticoagulants: no Anti-inflammatory: no Antibiotics: no  Procedure: Bilateral L3-5 MBB Position: Prone   Start Time: 155pm End Time: 206pm Fluoro Time: 47s  RN/CMA Lenzi Marmo CMA Kable Haywood CMA    Time 120pm 212pm    BP 122/84 118/80    Pulse 77 75    Respirations 16 16    O2 Sat 96 98    S/S 6 6    Pain Level 8/10 0/10     D/C home with wife, patient A & O X 3, D/C instructions reviewed, and sits independently.

## 2019-01-29 NOTE — Progress Notes (Signed)
Bilateral Lumbar L3, L4  medial branch blocks and L 5 dorsal ramus injection under fluoroscopic guidance  Indication: Lumbar pain which is not relieved by medication management or other conservative care and interfering with self-care and mobility.  Informed consent was obtained after describing risks and benefits of the procedure with the patient, this includes bleeding, infection, paralysis and medication side effects.  The patient wishes to proceed and has given written consent.  The patient was placed in prone position.  The lumbar area was marked and prepped with Betadine.  One mL of 1% lidocaine was injected into each of 6 areas into the skin and subcutaneous tissue.  Then a 22-gauge 3.5 inch  spinal needle was inserted targeting the junction of the left S1 superior articular process and sacral ala junction. Needle was advanced under fluoroscopic guidance.  Bone contact was made.  Isovue 200 was injected x 0.5 mL demonstrating no intravascular uptake.  Then a solution  of 2% MPF lidocaine was injected x 0.5 mL.  Then the left L5 superior articular process in transverse process junction was targeted.  Bone contact was made.  Isovue 200 was injected x 0.5 mL demonstrating no intravascular uptake. Then a solution containing  2% MPF lidocaine was injected x 0.5 mL.  Then the left L4 superior articular process in transverse process junction was targeted.  Bone contact was made.  Isovue 200 was injected x 0.5 mL demonstrating no intravascular uptake.  Then a solution containing2% MPF lidocaine was injected x 0.5 mL.  This same procedure was performed on the right side using the same needle, technique and injectate.  Patient tolerated procedure well.  Post procedure instructions were given.  Pain: Pre-injection 8/10 postinjection 0/10

## 2019-02-03 ENCOUNTER — Telehealth: Payer: Self-pay

## 2019-02-03 NOTE — Telephone Encounter (Signed)
Noted thank you

## 2019-02-03 NOTE — Telephone Encounter (Signed)
Patient called to inform Shanda Bumps that he went to a pain specialist, had injections in back and it made pain worse. Patient will follow-up with pain specialist in 4 weeks. Patient has also tried to contact specialist to let them know that he did not get any relief from injections and is awaiting a return call.  FYI

## 2019-02-09 ENCOUNTER — Other Ambulatory Visit: Payer: Self-pay | Admitting: *Deleted

## 2019-02-09 DIAGNOSIS — G8929 Other chronic pain: Secondary | ICD-10-CM

## 2019-02-09 DIAGNOSIS — M5440 Lumbago with sciatica, unspecified side: Secondary | ICD-10-CM

## 2019-02-09 DIAGNOSIS — M150 Primary generalized (osteo)arthritis: Principal | ICD-10-CM

## 2019-02-09 DIAGNOSIS — M159 Polyosteoarthritis, unspecified: Secondary | ICD-10-CM

## 2019-02-09 NOTE — Telephone Encounter (Signed)
Patient requested refill [place] Verified LR: 01/11/2019 #120 Pended Rx and sent to [facility] for approval.

## 2019-02-10 MED ORDER — OXYCODONE HCL 15 MG PO TABS: 15.0000 mg | ORAL_TABLET | Freq: Four times a day (QID) | ORAL | 0 refills | Status: DC | PRN

## 2019-02-27 ENCOUNTER — Ambulatory Visit: Payer: Medicare Other | Admitting: Physical Medicine & Rehabilitation

## 2019-03-10 ENCOUNTER — Other Ambulatory Visit: Payer: Self-pay | Admitting: *Deleted

## 2019-03-10 DIAGNOSIS — M5440 Lumbago with sciatica, unspecified side: Secondary | ICD-10-CM

## 2019-03-10 DIAGNOSIS — M159 Polyosteoarthritis, unspecified: Secondary | ICD-10-CM

## 2019-03-10 DIAGNOSIS — M150 Primary generalized (osteo)arthritis: Principal | ICD-10-CM

## 2019-03-10 DIAGNOSIS — G8929 Other chronic pain: Secondary | ICD-10-CM

## 2019-03-10 NOTE — Telephone Encounter (Signed)
Patient requested refill [place] Database Verified LR: 02/10/19 Pended Rx and sent to [facility] for approval.

## 2019-03-11 MED ORDER — OXYCODONE HCL 15 MG PO TABS: 15.0000 mg | ORAL_TABLET | Freq: Four times a day (QID) | ORAL | 0 refills | Status: DC | PRN

## 2019-03-13 ENCOUNTER — Encounter: Payer: Medicare Other | Attending: Physical Medicine & Rehabilitation

## 2019-03-13 ENCOUNTER — Ambulatory Visit: Payer: Medicare Other | Admitting: Physical Medicine & Rehabilitation

## 2019-03-13 DIAGNOSIS — I69351 Hemiplegia and hemiparesis following cerebral infarction affecting right dominant side: Secondary | ICD-10-CM | POA: Insufficient documentation

## 2019-03-13 DIAGNOSIS — M5442 Lumbago with sciatica, left side: Secondary | ICD-10-CM | POA: Insufficient documentation

## 2019-03-13 DIAGNOSIS — M5441 Lumbago with sciatica, right side: Secondary | ICD-10-CM | POA: Insufficient documentation

## 2019-03-13 DIAGNOSIS — G8929 Other chronic pain: Secondary | ICD-10-CM | POA: Insufficient documentation

## 2019-04-09 ENCOUNTER — Other Ambulatory Visit: Payer: Self-pay | Admitting: *Deleted

## 2019-04-09 ENCOUNTER — Telehealth: Payer: Self-pay | Admitting: Nurse Practitioner

## 2019-04-09 DIAGNOSIS — M5440 Lumbago with sciatica, unspecified side: Secondary | ICD-10-CM

## 2019-04-09 DIAGNOSIS — M150 Primary generalized (osteo)arthritis: Principal | ICD-10-CM

## 2019-04-09 DIAGNOSIS — M159 Polyosteoarthritis, unspecified: Secondary | ICD-10-CM

## 2019-04-09 DIAGNOSIS — G8929 Other chronic pain: Secondary | ICD-10-CM

## 2019-04-09 MED ORDER — OXYCODONE HCL 15 MG PO TABS: 15.0000 mg | ORAL_TABLET | Freq: Four times a day (QID) | ORAL | 0 refills | Status: DC | PRN

## 2019-04-09 NOTE — Telephone Encounter (Signed)
Patient requested refill [place]CC[place] Database Verified LR: 03/11/2019 Pended Rx and sent to [facility] for approval.

## 2019-04-09 NOTE — Telephone Encounter (Signed)
Pt needs pain med refilled oxycodone to [facility] on [address]  Thanks, Rosezella [place]

## 2019-04-16 ENCOUNTER — Other Ambulatory Visit: Payer: Self-pay

## 2019-04-16 ENCOUNTER — Ambulatory Visit (INDEPENDENT_AMBULATORY_CARE_PROVIDER_SITE_OTHER): Payer: Medicare Other | Admitting: Nurse Practitioner

## 2019-04-16 DIAGNOSIS — E782 Mixed hyperlipidemia: Secondary | ICD-10-CM | POA: Diagnosis not present

## 2019-04-16 DIAGNOSIS — I10 Essential (primary) hypertension: Secondary | ICD-10-CM

## 2019-04-16 DIAGNOSIS — K5903 Drug induced constipation: Secondary | ICD-10-CM | POA: Diagnosis not present

## 2019-04-16 DIAGNOSIS — I69351 Hemiplegia and hemiparesis following cerebral infarction affecting right dominant side: Secondary | ICD-10-CM | POA: Diagnosis not present

## 2019-04-16 DIAGNOSIS — M5440 Lumbago with sciatica, unspecified side: Secondary | ICD-10-CM

## 2019-04-16 DIAGNOSIS — Z8673 Personal history of transient ischemic attack (TIA), and cerebral infarction without residual deficits: Secondary | ICD-10-CM

## 2019-04-16 DIAGNOSIS — R197 Diarrhea, unspecified: Secondary | ICD-10-CM

## 2019-04-16 DIAGNOSIS — G8929 Other chronic pain: Secondary | ICD-10-CM

## 2019-04-16 DIAGNOSIS — T402X5A Adverse effect of other opioids, initial encounter: Secondary | ICD-10-CM

## 2019-04-16 NOTE — Patient Instructions (Signed)
STOP all CBD oil as this could be causing diarrhea Increase hydration.  Make sure you have increased your Lipitor to 1 tablet to = 20 mg daily  Heart-Healthy Eating Plan Many factors influence your heart (coronary) health, including eating and exercise habits. Coronary risk increases with abnormal blood fat (lipid) levels. Heart-healthy meal planning includes limiting unhealthy fats, increasing healthy fats, and making other diet and lifestyle changes. What are tips for following this plan? Cooking Cook foods using methods other than frying. Baking, boiling, grilling, and broiling are all good options. Other ways to reduce fat include:  Removing the skin from poultry.  Removing all visible fats from meats.  Steaming vegetables in water or broth. Meal planning   At meals, imagine dividing your plate into fourths: ? Fill one-half of your plate with vegetables and green salads. ? Fill one-fourth of your plate with whole grains. ? Fill one-fourth of your plate with lean protein foods.  Eat 4-5 servings of vegetables per day. One serving equals 1 cup raw or cooked vegetable, or 2 cups raw leafy greens.  Eat 4-5 servings of fruit per day. One serving equals 1 medium whole fruit,  cup dried fruit,  cup fresh, frozen, or canned fruit, or  cup 100% fruit juice.  Eat more foods that contain soluble fiber. Examples include apples, broccoli, carrots, beans, peas, and barley. Aim to get 25-30 g of fiber per day.  Increase your consumption of legumes, nuts, and seeds to 4-5 servings per week. One serving of dried beans or legumes equals  cup cooked, 1 serving of nuts is  cup, and 1 serving of seeds equals 1 tablespoon. Fats  Choose healthy fats more often. Choose monounsaturated and polyunsaturated fats, such as olive and canola oils, flaxseeds, walnuts, almonds, and seeds.  Eat more omega-3 fats. Choose salmon, mackerel, sardines, tuna, flaxseed oil, and ground flaxseeds. Aim to eat fish  at least 2 times each week.  Check food labels carefully to identify foods with trans fats or high amounts of saturated fat.  Limit saturated fats. These are found in animal products, such as meats, butter, and cream. Plant sources of saturated fats include palm oil, palm kernel oil, and coconut oil.  Avoid foods with partially hydrogenated oils in them. These contain trans fats. Examples are stick margarine, some tub margarines, cookies, crackers, and other baked goods.  Avoid fried foods. General information  Eat more home-cooked food and less restaurant, buffet, and fast food.  Limit or avoid alcohol.  Limit foods that are high in starch and sugar.  Lose weight if you are overweight. Losing just 5-10% of your body weight can help your overall health and prevent diseases such as diabetes and heart disease.  Monitor your salt (sodium) intake, especially if you have high blood pressure. Talk with your health care provider about your sodium intake.  Try to incorporate more vegetarian meals weekly. What foods can I eat? Fruits All fresh, canned (in natural juice), or frozen fruits. Vegetables Fresh or frozen vegetables (raw, steamed, roasted, or grilled). Green salads. Grains Most grains. Choose whole wheat and whole grains most of the time. Rice and pasta, including brown rice and pastas made with whole wheat. Meats and other proteins Lean, well-trimmed beef, veal, pork, and lamb. Chicken and [place] without skin. All fish and shellfish. Wild duck, rabbit, pheasant, and venison. Egg whites or low-cholesterol egg substitutes. Dried beans, peas, lentils, and tofu. Seeds and most nuts. Dairy Low-fat or nonfat cheeses, including ricotta and mozzarella.  Skim or 1% milk (liquid, powdered, or evaporated). Buttermilk made with low-fat milk. Nonfat or low-fat yogurt. Fats and oils Non-hydrogenated (trans-free) margarines. Vegetable oils, including soybean, sesame, sunflower, olive, peanut,  safflower, corn, canola, and cottonseed. Salad dressings or mayonnaise made with a vegetable oil. Beverages Water (mineral or sparkling). Coffee and tea. Diet carbonated beverages. Sweets and desserts Sherbet, gelatin, and fruit ice. Small amounts of dark chocolate. Limit all sweets and desserts. Seasonings and condiments All seasonings and condiments. The items listed above may not be a complete list of foods and beverages you can eat. Contact a dietitian for more options. What foods are not recommended? Fruits Canned fruit in heavy syrup. Fruit in cream or butter sauce. Fried fruit. Limit coconut. Vegetables Vegetables cooked in cheese, cream, or butter sauce. Fried vegetables. Grains Breads made with saturated or trans fats, oils, or whole milk. Croissants. Sweet rolls. Donuts. High-fat crackers, such as cheese crackers. Meats and other proteins Fatty meats, such as hot dogs, ribs, sausage, bacon, rib-eye roast or steak. High-fat deli meats, such as salami and bologna. Caviar. Domestic duck and goose. Organ meats, such as liver. Dairy Cream, sour cream, cream cheese, and creamed cottage cheese. Whole milk cheeses. Whole or 2% milk (liquid, evaporated, or condensed). Whole buttermilk. Cream sauce or high-fat cheese sauce. Whole-milk yogurt. Fats and oils Meat fat, or shortening. Cocoa butter, hydrogenated oils, palm oil, coconut oil, palm kernel oil. Solid fats and shortenings, including bacon fat, salt pork, lard, and butter. Nondairy cream substitutes. Salad dressings with cheese or sour cream. Beverages Regular sodas and any drinks with added sugar. Sweets and desserts Frosting. Pudding. Cookies. Cakes. Pies. Milk chocolate or white chocolate. Buttered syrups. Full-fat ice cream or ice cream drinks. The items listed above may not be a complete list of foods and beverages to avoid. Contact a dietitian for more information. Summary  Heart-healthy meal planning includes limiting  unhealthy fats, increasing healthy fats, and making other diet and lifestyle changes.  Lose weight if you are overweight. Losing just 5-10% of your body weight can help your overall health and prevent diseases such as diabetes and heart disease.  Focus on eating a balance of foods, including fruits and vegetables, low-fat or nonfat dairy, lean protein, nuts and legumes, whole grains, and heart-healthy oils and fats. This information is not intended to replace advice given to you by your health care provider. Make sure you discuss any questions you have with your health care provider. Document Released: 09/11/2008 Document Revised: 01/10/2018 Document Reviewed: 01/10/2018 Elsevier Interactive Patient Education  2019 [employer].

## 2019-04-16 NOTE — Progress Notes (Signed)
This service is provided via telemedicine  No vital signs collected/recorded due to the encounter was a telemedicine visit.   Location of patient (ex: home, work):  Home  Patient consents to a telephone visit:  Yes  Location of the provider (ex: office, home):  [facility], Office   Name of any referring provider:  N/A  Names of all persons participating in the telemedicine service and their role in the encounter: S.Chrae B/CMA, Abbey Chatters, NP, and Patient    Time spent on call:  8 min with medical assistant   Virtual Visit via Telephone Note  I connected with Nathan Buchanan on 04/16/19 at  3:15 PM EDT by telephone and verified that I am speaking with the correct person using two identifiers.  Location: Patient: home Provider: office    I discussed the limitations, risks, security and privacy concerns of performing an evaluation and management service by telephone and the availability of in person appointments. I also discussed with the patient that there may be a patient responsible charge related to this service. The patient expressed understanding and agreed to proceed.    Careteam: Patient Care Team: Sharon Seller, NP as PCP - General (Geriatric Medicine) Marvel Plan, MD as Consulting Physician (Neurology)  Advanced Directive information Does Patient Have a Medical Advance Directive?: No, Would patient like information on creating a medical advance directive?: Yes (MAU/Ambulatory/Procedural Areas - Information given)  No Known Allergies  Chief Complaint  Patient presents with  . Medical Management of Chronic Issues    3 month follow-up. Televisit   . Best Practice Recommendations    Discuss colonoscopy recommendation, patient completed cologuard about 6 months ago. Discuss need for HIV screening  . Advanced Directive    Discuss ACP      HPI: Patient is a 63 y.o. male for routine follow up.   Reports diarrhea which started last week. No  new medications are supplements. 2 BM a day.  No fevers or chills.  No abdominal pain.  No wheezing, shortness of breath or cough Reports he does use CBD pills for about a month.   HTN- controlled on HCTZ in the past, does not take blood pressure at home.   Chronic low back pain/OA- multiple joints. Taking oxycodone for pain control. Taking 15 mg every 6 hours as needed for severe pain. No side effects. Has seen specialist and nothing has worked on his back.   Substance abuse- has stopped smoking marijuana. Started using CBD pills and Vaping with CBD oil.    Hyperlipidemia - taking lipitor 20 mg for cholesterol. Only taking 1/2 tablet. (instructed to increase after last labs but he has not) LDL 98 goal <70  Hx CVA- stable, continues on ASA 325 mg daily  Review of Systems:  Review of Systems  Constitutional: Negative for chills, fever and weight loss.  HENT: Negative for tinnitus.   Respiratory: Negative for cough, sputum production and shortness of breath.   Cardiovascular: Negative for chest pain, palpitations and leg swelling.  Gastrointestinal: Positive for diarrhea. Negative for abdominal pain, constipation and heartburn.  Genitourinary: Negative for dysuria, frequency and urgency.  Musculoskeletal: Positive for back pain, joint pain and myalgias. Negative for falls.  Skin: Negative.   Neurological: Negative for dizziness and headaches.  Psychiatric/Behavioral: Negative for depression. The patient is not nervous/anxious and does not have insomnia.     Past Medical History:  Diagnosis Date  . Arthritis   . Back ache   . Bipolar I  disorder, most recent episode (or current) unspecified    no meds at present  . Chronic hepatitis C without mention of hepatic coma    tx. with meds/ was told" was cured"  . Cirrhosis of liver without mention of alcohol   . Dermatophytosis of the body   . Generalized osteoarthrosis, involving multiple sites   . Hyperlipidemia   . Hypertension    . Osteoarthrosis, unspecified whether generalized or localized, unspecified site   . Stroke [facility])     12'15 -Right sided weakness remains  . Unspecified glaucoma(365.9)   . Urinary frequency    Past Surgical History:  Procedure Laterality Date  . ABCESS DRAINAGE     Gluteal MRSA drainage   . ANAL FISSURE REPAIR  2008  . COLONOSCOPY  2008   Dr.Edwards, Internal Hemorrhoids   . CYSTOSCOPY N/A 01/24/2015   Procedure: CYSTOSCOPY FLEXIBLE;  Surgeon: Valetta Fuller, MD;  Location: [facility];  Service: Urology;  Laterality: N/A;  . PENILE PROSTHESIS IMPLANT  04/12/2010   Dr.Grapey, Insertion of 3 peice penile prosthesis   . PENILE PROSTHESIS IMPLANT N/A 01/24/2015   Procedure: EXPLANTATION OF PENILE PROSTHESIS;  Surgeon: Valetta Fuller, MD;  Location: [facility];  Service: Urology;  Laterality: N/A;   Social History:   reports that he quit smoking about 5 years ago. He quit after 5.00 years of use. He has never used smokeless tobacco. He reports current alcohol use. He reports previous drug use. Drug: Marijuana.  Family History  Problem Relation Age of Onset  . Hypertension Mother   . Diabetes Mother   . Stroke Father   . Cancer Father        colon  . Diabetes Sister   . Diabetes Sister   . Diabetes Brother     Medications: Patient's Medications  New Prescriptions   No medications on file  Previous Medications   ASPIRIN EC 325 MG TABLET    Take 1 tablet (325 mg total) by mouth daily.   ATORVASTATIN (LIPITOR) 20 MG TABLET    Take 1 tablet (20 mg total) by mouth daily at 6 PM. For Cholesterol   BIMATOPROST (LUMIGAN) 0.03 % OPHTHALMIC SOLUTION    Place 1 drop into both eyes at bedtime.   CLOTRIMAZOLE-BETAMETHASONE (LOTRISONE) CREAM    apply to affected area twice a day   DOCUSATE SODIUM (DSS) 100 MG CAPS    Take 100 mg by mouth 2 (two) times daily.   HYDROCHLOROTHIAZIDE (HYDRODIURIL) 50 MG TABLET    Take 1 tablet (50 mg total) by mouth daily with breakfast.   OXYCODONE (ROXICODONE) 15 MG  IMMEDIATE RELEASE TABLET    Take 1 tablet (15 mg total) by mouth every 6 (six) hours as needed for pain.  Modified Medications   No medications on file  Discontinued Medications   No medications on file     Physical Exam: unable due to tele-visit.     Labs reviewed: Basic Metabolic Panel: Recent Labs    07/14/18 0912 10/15/18 10/22/18 0914 01/22/19 1105  NA 138 139 138 139  K 4.2 3.6 3.8 4.2  CL 106  --  105 103  CO2 26  --  26 29  GLUCOSE 93  --  96 89  BUN CREATININE 0.99 1.1 0.94 1.04  CALCIUM 9.2  --  8.9 9.2  TSH 2.64 2.96  --   --    Liver Function Tests: Recent Labs    07/14/18 0912 10/15/18 10/22/18 0914  01/22/19 1105  AST 30 28 32 32  ALT 23 30 23 28   ALKPHOS  --  79  --   --   BILITOT 0.4  --  0.4 0.5  PROT 7.5  --  7.4 7.8   No results for input(s): LIPASE, AMYLASE in the last 8760 hours. No results for input(s): AMMONIA in the last 8760 hours. CBC: Recent Labs    10/22/18 0914 10/30/18 0903 01/22/19 1105  WBC 12.9* 10.5 13.0*  NEUTROABS 2,245 1,512 1,716  HGB 14.1 15.0 14.0  HCT 41.0 43.6 41.1  MCV 88.2 88.1 90.5  PLT 203 227 199   Lipid Panel: Recent Labs    07/14/18 0912 10/15/18 10/22/18 0914 01/22/19 1105  CHOL 171 169 165 191  HDL 43 54 47 53  LDLCALC 98 78 98 113*  TRIG 204* 186* 105 141  CHOLHDL 4.0  --  3.5 3.6   TSH: Recent Labs    07/14/18 0912 10/15/18  TSH 2.64 2.96   A1C: Lab Results  Component Value Date   HGBA1C 5.5 10/15/2018     Assessment/Plan 1. Essential hypertension Has been well controlled on previous visit, continues on hctz.   2. Constipation due to opioid therapy No longer an issues, currently with diarrhea. Uses colace but not currently taking  3. Hemiparesis affecting right side as late effect of cerebrovascular accident (HCC) Stable, continues on ASA, now worsening of symptoms.   4. Chronic left-sided low back pain with sciatica, sciatica laterality unspecified -pain is  well controlled with oxycodone as needed. Takes all that is prescribed and only our office is his only prescribing medication. No side effects noted.   5. Mixed hyperlipidemia -encouraged to increase lipitor to 20 mg daily (as directed in the past) and to continue to work on dietary modifications. Will have him follow up lipid panel in 6 weeks.   6. Diarrhea, unspecified type Suspect this is related to CBD oil, to stop using at this time and monitor. To increase hydration. To notify if symptoms do not improve.  7. History of stroke Stable, continues on ASA 325 mg daily, encouraged proper control of blood pressure and cholesterol.   Next appt: 05/22/2019 Janene HarveyJessica K. Biagio BorgEubanks, AGNP  [facility] [phone]    Follow Up Instructions:    I discussed the assessment and treatment plan with the patient. The patient was provided an opportunity to ask questions and all were answered. The patient agreed with the plan and demonstrated an understanding of the instructions.   The patient was advised to call back or seek an in-person evaluation if the symptoms worsen or if the condition fails to improve as anticipated.  I provided 25 minutes of non-face-to-face time during this encounter.  avs printed and mailed.  Sharon SellerJessica K Kemp Gomes, NP

## 2019-04-17 ENCOUNTER — Other Ambulatory Visit: Payer: Medicare Other

## 2019-04-22 ENCOUNTER — Ambulatory Visit: Payer: Medicare Other | Admitting: Nurse Practitioner

## 2019-05-01 ENCOUNTER — Other Ambulatory Visit: Payer: Self-pay

## 2019-05-08 ENCOUNTER — Other Ambulatory Visit: Payer: Self-pay

## 2019-05-08 DIAGNOSIS — M159 Polyosteoarthritis, unspecified: Secondary | ICD-10-CM

## 2019-05-08 DIAGNOSIS — G8929 Other chronic pain: Secondary | ICD-10-CM

## 2019-05-08 MED ORDER — OXYCODONE HCL 15 MG PO TABS: 15.0000 mg | ORAL_TABLET | Freq: Four times a day (QID) | ORAL | 0 refills | Status: DC | PRN

## 2019-05-08 NOTE — Telephone Encounter (Signed)
[place] Database verified and compliance confirmed   Last filled 04/10/2019

## 2019-05-22 ENCOUNTER — Other Ambulatory Visit: Payer: Medicare Other

## 2019-05-22 ENCOUNTER — Other Ambulatory Visit: Payer: Self-pay

## 2019-05-22 DIAGNOSIS — E782 Mixed hyperlipidemia: Secondary | ICD-10-CM | POA: Diagnosis not present

## 2019-05-22 LAB — LIPID PANEL
Cholesterol: 115 mg/dL (ref <200)
HDL: 49 mg/dL (ref >=40)
LDL Cholesterol (Calc): 48 mg/dL (calc)
Non-HDL Cholesterol (Calc): 66 mg/dL (calc) (ref <130)
Total CHOL/HDL Ratio: 2.3 (calc) (ref <5.0)
Triglycerides: 95 mg/dL (ref <150)

## 2019-06-09 ENCOUNTER — Telehealth: Payer: Self-pay | Admitting: Nurse Practitioner

## 2019-06-09 ENCOUNTER — Other Ambulatory Visit: Payer: Self-pay | Admitting: *Deleted

## 2019-06-09 DIAGNOSIS — M159 Polyosteoarthritis, unspecified: Secondary | ICD-10-CM

## 2019-06-09 DIAGNOSIS — M5440 Lumbago with sciatica, unspecified side: Secondary | ICD-10-CM

## 2019-06-09 DIAGNOSIS — G8929 Other chronic pain: Secondary | ICD-10-CM

## 2019-06-09 MED ORDER — OXYCODONE HCL 15 MG PO TABS: 15.0000 mg | ORAL_TABLET | Freq: Four times a day (QID) | ORAL | 0 refills | Status: DC | PRN

## 2019-06-09 NOTE — Telephone Encounter (Signed)
Patient request prescription for Oxycodone 15mg  at [facility] on [address].

## 2019-06-09 NOTE — Telephone Encounter (Signed)
Patient requested. LM on his voicemail that Rx was sent to provider and once they approve it they will send to pharmacy.

## 2019-06-09 NOTE — Telephone Encounter (Signed)
Patient requested refill [place]CCS[place] Database Verified LR: 05/10/2019 Pended Rx and sent to [facility] for approval.

## 2019-06-12 ENCOUNTER — Other Ambulatory Visit: Payer: Self-pay

## 2019-06-12 DIAGNOSIS — E785 Hyperlipidemia, unspecified: Secondary | ICD-10-CM

## 2019-06-12 MED ORDER — ATORVASTATIN CALCIUM 20 MG PO TABS: 20.0000 mg | ORAL_TABLET | Freq: Every day | ORAL | 1 refills | Status: AC

## 2019-07-08 ENCOUNTER — Other Ambulatory Visit: Payer: Self-pay | Admitting: *Deleted

## 2019-07-08 DIAGNOSIS — M159 Polyosteoarthritis, unspecified: Secondary | ICD-10-CM

## 2019-07-08 DIAGNOSIS — G8929 Other chronic pain: Secondary | ICD-10-CM

## 2019-07-08 DIAGNOSIS — M5440 Lumbago with sciatica, unspecified side: Secondary | ICD-10-CM

## 2019-07-08 MED ORDER — OXYCODONE HCL 15 MG PO TABS: 15.0000 mg | ORAL_TABLET | Freq: Four times a day (QID) | ORAL | 0 refills | Status: DC | PRN

## 2019-07-08 NOTE — Telephone Encounter (Signed)
Patient requested refill Epic LR: 06/09/2019 Pended Rx and sent to [facility] for approval.

## 2019-07-17 ENCOUNTER — Other Ambulatory Visit: Payer: Self-pay

## 2019-07-17 ENCOUNTER — Encounter: Payer: Self-pay | Admitting: Nurse Practitioner

## 2019-07-17 ENCOUNTER — Ambulatory Visit (INDEPENDENT_AMBULATORY_CARE_PROVIDER_SITE_OTHER): Payer: Medicare Other | Admitting: Nurse Practitioner

## 2019-07-17 ENCOUNTER — Ambulatory Visit: Payer: Self-pay

## 2019-07-17 DIAGNOSIS — Z0000 Encounter for general adult medical examination without abnormal findings: Secondary | ICD-10-CM | POA: Diagnosis not present

## 2019-07-17 DIAGNOSIS — R197 Diarrhea, unspecified: Secondary | ICD-10-CM

## 2019-07-17 DIAGNOSIS — G8929 Other chronic pain: Secondary | ICD-10-CM

## 2019-07-17 DIAGNOSIS — M5440 Lumbago with sciatica, unspecified side: Secondary | ICD-10-CM

## 2019-07-17 DIAGNOSIS — I10 Essential (primary) hypertension: Secondary | ICD-10-CM | POA: Diagnosis not present

## 2019-07-17 DIAGNOSIS — E782 Mixed hyperlipidemia: Secondary | ICD-10-CM | POA: Diagnosis not present

## 2019-07-17 DIAGNOSIS — I69351 Hemiplegia and hemiparesis following cerebral infarction affecting right dominant side: Secondary | ICD-10-CM

## 2019-07-17 NOTE — Progress Notes (Signed)
This service is provided via telemedicine  No vital signs collected/recorded due to the encounter was a telemedicine visit.   Location of patient (ex: home, work):  Home  Patient consents to a telephone visit:  Yes  Location of the provider (ex: office, home):  Office  Name of any referring provider:  N/A  Names of all persons participating in the telemedicine service and their role in the encounter: Donell SievertAdolph Achorn, Particia LatherSally Luster RMA, Abbey ChattersJessica Adron Geisel NP  Time spent on call: 10 min     Careteam: Patient Care Team: Sharon SellerEubanks, Brooks Kinnan K, NP as PCP - General (Geriatric Medicine) Marvel PlanXu, Jindong, MD as Consulting Physician (Neurology)  Advanced Directive information Does Patient Have a Medical Advance Directive?: No, Would patient like information on creating a medical advance directive?: No - Patient declined  No Known Allergies  Chief Complaint  Patient presents with  . Medical Management of Chronic Issues    3 month Follow Up Visit     HPI: Patient is a 63 y.o. male for routine follow up.  [facility]   Hyperlipidemia-taking Lipitor 20 mg daily and LDL currnetly at goal.  Diarrhea- improved   Hypertension- taking hctz, 118/70 per pt for home readings.   Hx CVA- taking ASA 325 mg daily  Has lost weight, states he is trying to eat less due to not getting gout and moving around as much, due to hx of stroke with weakness on right side has to make sure his weight is controlled or mobility is effected.   Chronic low back pain/OA- multiple joints. Taking oxycodone for pain control. Taking 15 mg every 6 hours as needed for severe pain.   Review of Systems:  Review of Systems  Constitutional: Negative for chills, fever and weight loss.  HENT: Negative for tinnitus.   Respiratory: Negative for cough, sputum production and shortness of breath.   Cardiovascular: Negative for chest pain, palpitations and leg swelling.  Gastrointestinal: Negative  for abdominal pain, constipation, diarrhea and heartburn.  Genitourinary: Negative for dysuria, frequency and urgency.  Musculoskeletal: Positive for back pain, joint pain and myalgias. Negative for falls.  Skin: Negative.   Neurological: Negative for dizziness and headaches.  Psychiatric/Behavioral: Negative for depression. The patient is not nervous/anxious and does not have insomnia.     Past Medical History:  Diagnosis Date  . Arthritis   . Back ache   . Bipolar I disorder, most recent episode (or current) unspecified    no meds at present  . Chronic hepatitis C without mention of hepatic coma    tx. with meds/ was told" was cured"  . Cirrhosis of liver without mention of alcohol   . Dermatophytosis of the body   . Generalized osteoarthrosis, involving multiple sites   . Hyperlipidemia   . Hypertension   . Osteoarthrosis, unspecified whether generalized or localized, unspecified site   . Stroke [facility](HCC)     12'15 -Right sided weakness remains  . Unspecified glaucoma(365.9)   . Urinary frequency    Past Surgical History:  Procedure Laterality Date  . ABCESS DRAINAGE     Gluteal MRSA drainage   . ANAL FISSURE REPAIR  2008  . COLONOSCOPY  2008   Dr.Edwards, Internal Hemorrhoids   . CYSTOSCOPY N/A 01/24/2015   Procedure: CYSTOSCOPY FLEXIBLE;  Surgeon: Valetta Fulleravid S Grapey, MD;  Location: [facility];  Service: Urology;  Laterality: N/A;  . PENILE PROSTHESIS IMPLANT  04/12/2010   Dr.Grapey, Insertion of 3 peice penile prosthesis   .  PENILE PROSTHESIS IMPLANT N/A 01/24/2015   Procedure: EXPLANTATION OF PENILE PROSTHESIS;  Surgeon: Valetta Fulleravid S Grapey, MD;  Location: [facility];  Service: Urology;  Laterality: N/A;   Social History:   reports that he quit smoking about 5 years ago. He quit after 5.00 years of use. He has never used smokeless tobacco. He reports previous alcohol use. He reports previous drug use. Drug: Marijuana.  Family History  Problem Relation Age of Onset  . Hypertension Mother    . Diabetes Mother   . Stroke Father   . Cancer Father        colon  . Diabetes Sister   . Diabetes Sister   . Diabetes Brother     Medications: Patient's Medications  New Prescriptions   No medications on file  Previous Medications   ASPIRIN EC 325 MG TABLET    Take 1 tablet (325 mg total) by mouth daily.   ATORVASTATIN (LIPITOR) 20 MG TABLET    Take 1 tablet (20 mg total) by mouth daily at 6 PM. For Cholesterol   BIMATOPROST (LUMIGAN) 0.03 % OPHTHALMIC SOLUTION    Place 1 drop into both eyes at bedtime.   CLOTRIMAZOLE-BETAMETHASONE (LOTRISONE) CREAM    apply to affected area twice a day   DOCUSATE SODIUM (DSS) 100 MG CAPS    Take 100 mg by mouth 2 (two) times daily.   HYDROCHLOROTHIAZIDE (HYDRODIURIL) 50 MG TABLET    Take 1 tablet (50 mg total) by mouth daily with breakfast.   OXYCODONE (ROXICODONE) 15 MG IMMEDIATE RELEASE TABLET    Take 1 tablet (15 mg total) by mouth every 6 (six) hours as needed for pain.  Modified Medications   No medications on file  Discontinued Medications   No medications on file    Physical Exam:  There were no vitals filed for this visit. There is no height or weight on file to calculate BMI. Wt Readings from Last 3 Encounters:  01/29/19 163 lb (73.9 kg)  01/22/19 163 lb 12.8 oz (74.3 kg)  01/13/19 163 lb (73.9 kg)     Labs reviewed: Basic Metabolic Panel: Recent Labs    10/15/18 10/22/18 0914 01/22/19 1105  NA 139 138 139  K 3.6 3.8 4.2  CL  --  105 103  CO2  --  26 29  GLUCOSE  --  96 89  BUN 16 17 22   CREATININE 1.1 0.94 1.04  CALCIUM  --  8.9 9.2  TSH 2.96  --   --    Liver Function Tests: Recent Labs    10/15/18 10/22/18 0914 01/22/19 1105  AST 28 32 32  ALT 30 23 28   ALKPHOS 79  --   --   BILITOT  --  0.4 0.5  PROT  --  7.4 7.8   No results for input(s): LIPASE, AMYLASE in the last 8760 hours. No results for input(s): AMMONIA in the last 8760 hours. CBC: Recent Labs    10/22/18 0914 10/30/18 0903 01/22/19 1105   WBC 12.9* 10.5 13.0*  NEUTROABS 2,245 1,512 1,716  HGB 14.1 15.0 14.0  HCT 41.0 43.6 41.1  MCV 88.2 88.1 90.5  PLT 203 227 199   Lipid Panel: Recent Labs    10/22/18 0914 01/22/19 1105 05/22/19 0818  CHOL 165 191 115  HDL 47 53 49  LDLCALC 98 113* 48  TRIG 105 141 95  CHOLHDL 3.5 3.6 2.3   TSH: Recent Labs    10/15/18  TSH 2.96   A1C: Lab Results  Component Value Date   HGBA1C 5.5 10/15/2018     Assessment/Plan 1. Essential hypertension -controlled in office on previously reading in office and per home readings. Continues on hctz - CBC with Differential/Platelet; Future  2. Hemiparesis affecting right side as late effect of cerebrovascular accident ([place]) -stable, without worsening weakness, continues on ASA   3. Chronic left-sided low back pain with sciatica, sciatica laterality unspecified Stable, no worsening of pain noted. Continues on oxycodone as needed without side effects.   4. Mixed hyperlipidemia Improved on lipitor 20 mg daily, will continue with recommendation of dietary modifications. - COMPLETE METABOLIC PANEL WITH GFR; Future  5. Diarrhea, unspecified type Resolved.  Next appt: 6 months.  Carlos American. Harle Battiest  [facility] [phone]    Virtual Visit via Telephone Note  I connected with pt on 07/17/19 at  9:30 AM EDT by telephone and verified that I am speaking with the correct person using two identifiers.  Location: Patient: home Provider: office   I discussed the limitations, risks, security and privacy concerns of performing an evaluation and management service by telephone and the availability of in person appointments. I also discussed with the patient that there may be a patient responsible charge related to this service. The patient expressed understanding and agreed to proceed.   I discussed the assessment and treatment plan with the patient. The patient was provided an opportunity to ask  questions and all were answered. The patient agreed with the plan and demonstrated an understanding of the instructions.   The patient was advised to call back or seek an in-person evaluation if the symptoms worsen or if the condition fails to improve as anticipated.  I provided 18  minutes of non-face-to-face time during this encounter.  Carlos American. Harle Battiest Avs printed and mailed

## 2019-07-17 NOTE — Patient Instructions (Signed)
Nathan Buchanan , Thank you for taking time to come for your Medicare Wellness Visit. I appreciate your ongoing commitment to your health goals. Please review the following plan we discussed and let me know if I can assist you in the future.   Screening recommendations/referrals: Colonoscopy up to date Recommended yearly ophthalmology/optometry visit for glaucoma screening and checkup Recommended yearly dental visit for hygiene and checkup  Vaccinations: Influenza vaccine need 07/2019 Pneumococcal vaccine up to date Tdap vaccine up to date Shingles vaccine- please get last shingles vaccine at pharmacy     Advanced directives: will send you the information, please complete and return to office once notorized  Conditions/risks identified: -risk of vascular disease due to hx of stroke  Next appointment: 1 year  Preventive Care 63 Years and Older, Male Preventive care refers to lifestyle choices and visits with your health care provider that can promote health and wellness. What does preventive care include?  A yearly physical exam. This is also called an annual well check.  Dental exams once or twice a year.  Routine eye exams. Ask your health care provider how often you should have your eyes checked.  Personal lifestyle choices, including:  Daily care of your teeth and gums.  Regular physical activity.  Eating a healthy diet.  Avoiding tobacco and drug use.  Limiting alcohol use.  Practicing safe sex.  Taking low doses of aspirin every day.  Taking vitamin and mineral supplements as recommended by your health care provider. What happens during an annual well check? The services and screenings done by your health care provider during your annual well check will depend on your age, overall health, lifestyle risk factors, and family history of disease. Counseling  Your health care provider may ask you questions about your:  Alcohol use.  Tobacco use.  Drug use.   Emotional well-being.  Home and relationship well-being.  Sexual activity.  Eating habits.  History of falls.  Memory and ability to understand (cognition).  Work and work Statistician. Screening  You may have the following tests or measurements:  Height, weight, and BMI.  Blood pressure.  Lipid and cholesterol levels. These may be checked every 5 years, or more frequently if you are over 22 years old.  Skin check.  Lung cancer screening. You may have this screening every year starting at age 55 if you have a 30-pack-year history of smoking and currently smoke or have quit within the past 15 years.  Fecal occult blood test (FOBT) of the stool. You may have this test every year starting at age 63.  Flexible sigmoidoscopy or colonoscopy. You may have a sigmoidoscopy every 5 years or a colonoscopy every 10 years starting at age 63.  Prostate cancer screening. Recommendations will vary depending on your family history and other risks.  Hepatitis C blood test.  Hepatitis B blood test.  Sexually transmitted disease (STD) testing.  Diabetes screening. This is done by checking your blood sugar (glucose) after you have not eaten for a while (fasting). You may have this done every 1-3 years.  Abdominal aortic aneurysm (AAA) screening. You may need this if you are a current or former smoker.  Osteoporosis. You may be screened starting at age 55 if you are at high risk. Talk with your health care provider about your test results, treatment options, and if necessary, the need for more tests. Vaccines  Your health care provider may recommend certain vaccines, such as:  Influenza vaccine. This is recommended every year.  Tetanus, diphtheria, and acellular pertussis (Tdap, Td) vaccine. You may need a Td booster every 10 years.  Zoster vaccine. You may need this after age 35.  Pneumococcal 13-valent conjugate (PCV13) vaccine. One dose is recommended after age 1.  Pneumococcal  polysaccharide (PPSV23) vaccine. One dose is recommended after age 57. Talk to your health care provider about which screenings and vaccines you need and how often you need them. This information is not intended to replace advice given to you by your health care provider. Make sure you discuss any questions you have with your health care provider. Document Released: 12/30/2015 Document Revised: 08/22/2016 Document Reviewed: 10/04/2015 Elsevier Interactive Patient Education  2017 [place] Prevention in the Home Falls can cause injuries. They can happen to people of all ages. There are many things you can do to make your home safe and to help prevent falls. What can I do on the outside of my home?  Regularly fix the edges of walkways and driveways and fix any cracks.  Remove anything that might make you trip as you walk through a door, such as a raised step or threshold.  Trim any bushes or trees on the path to your home.  Use bright outdoor lighting.  Clear any walking paths of anything that might make someone trip, such as rocks or tools.  Regularly check to see if handrails are loose or broken. Make sure that both sides of any steps have handrails.  Any raised decks and porches should have guardrails on the edges.  Have any leaves, snow, or ice cleared regularly.  Use sand or salt on walking paths during winter.  Clean up any spills in your garage right away. This includes oil or grease spills. What can I do in the bathroom?  Use night lights.  Install grab bars by the toilet and in the tub and shower. Do not use towel bars as grab bars.  Use non-skid mats or decals in the tub or shower.  If you need to sit down in the shower, use a plastic, non-slip stool.  Keep the floor dry. Clean up any water that spills on the floor as soon as it happens.  Remove soap buildup in the tub or shower regularly.  Attach bath mats securely with double-sided non-slip rug tape.   Do not have throw rugs and other things on the floor that can make you trip. What can I do in the bedroom?  Use night lights.  Make sure that you have a light by your bed that is easy to reach.  Do not use any sheets or blankets that are too big for your bed. They should not hang down onto the floor.  Have a firm chair that has side arms. You can use this for support while you get dressed.  Do not have throw rugs and other things on the floor that can make you trip. What can I do in the kitchen?  Clean up any spills right away.  Avoid walking on wet floors.  Keep items that you use a lot in easy-to-reach places.  If you need to reach something above you, use a strong step stool that has a grab bar.  Keep electrical cords out of the way.  Do not use floor polish or wax that makes floors slippery. If you must use wax, use non-skid floor wax.  Do not have throw rugs and other things on the floor that can make you trip. What can I do  with my stairs?  Do not leave any items on the stairs.  Make sure that there are handrails on both sides of the stairs and use them. Fix handrails that are broken or loose. Make sure that handrails are as long as the stairways.  Check any carpeting to make sure that it is firmly attached to the stairs. Fix any carpet that is loose or worn.  Avoid having throw rugs at the top or bottom of the stairs. If you do have throw rugs, attach them to the floor with carpet tape.  Make sure that you have a light switch at the top of the stairs and the bottom of the stairs. If you do not have them, ask someone to add them for you. What else can I do to help prevent falls?  Wear shoes that:  Do not have high heels.  Have rubber bottoms.  Are comfortable and fit you well.  Are closed at the toe. Do not wear sandals.  If you use a stepladder:  Make sure that it is fully opened. Do not climb a closed stepladder.  Make sure that both sides of the  stepladder are locked into place.  Ask someone to hold it for you, if possible.  Clearly mark and make sure that you can see:  Any grab bars or handrails.  First and last steps.  Where the edge of each step is.  Use tools that help you move around (mobility aids) if they are needed. These include:  Canes.  Walkers.  Scooters.  Crutches.  Turn on the lights when you go into a dark area. Replace any light bulbs as soon as they burn out.  Set up your furniture so you have a clear path. Avoid moving your furniture around.  If any of your floors are uneven, fix them.  If there are any pets around you, be aware of where they are.  Review your medicines with your doctor. Some medicines can make you feel dizzy. This can increase your chance of falling. Ask your doctor what other things that you can do to help prevent falls. This information is not intended to replace advice given to you by your health care provider. Make sure you discuss any questions you have with your health care provider. Document Released: 09/29/2009 Document Revised: 05/10/2016 Document Reviewed: 01/07/2015 Elsevier Interactive Patient Education  2017 [employer].

## 2019-07-17 NOTE — Progress Notes (Signed)
Subjective:   Nathan Buchanan is a 63 y.o. male who presents for Medicare Annual/Subsequent preventive examination.  Review of Systems:         Objective:    Vitals: There were no vitals taken for this visit.  There is no height or weight on file to calculate BMI.  Advanced Directives 07/17/2019 07/17/2019 04/16/2019 01/22/2019 10/22/2018 07/14/2018 05/04/2017  Does Patient Have a Medical Advance Directive? No No No No No No No  Would patient like information on creating a medical advance directive? No - Patient declined No - Patient declined Yes (MAU/Ambulatory/Procedural Areas - Information given) Yes (ED - Information included in AVS) - Yes (MAU/Ambulatory/Procedural Areas - Information given) -    Tobacco Social History   Tobacco Use  Smoking Status Former Smoker  . Years: 5.00  . Quit date: 12/17/2013  . Years since quitting: 5.5  Smokeless Tobacco Never Used     Counseling given: Not Answered   Clinical Intake:                       Past Medical History:  Diagnosis Date  . Arthritis   . Back ache   . Bipolar I disorder, most recent episode (or current) unspecified    no meds at present  . Chronic hepatitis C without mention of hepatic coma    tx. with meds/ was told" was cured"  . Cirrhosis of liver without mention of alcohol   . Dermatophytosis of the body   . Generalized osteoarthrosis, involving multiple sites   . Hyperlipidemia   . Hypertension   . Osteoarthrosis, unspecified whether generalized or localized, unspecified site   . Stroke [facility])     12'15 -Right sided weakness remains  . Unspecified glaucoma(365.9)   . Urinary frequency    Past Surgical History:  Procedure Laterality Date  . ABCESS DRAINAGE     Gluteal MRSA drainage   . ANAL FISSURE REPAIR  2008  . COLONOSCOPY  2008   Dr.Edwards, Internal Hemorrhoids   . CYSTOSCOPY N/A 01/24/2015   Procedure: CYSTOSCOPY FLEXIBLE;  Surgeon: Bernestine Amass, MD;  [facility]: [facility];  Service: Urology;   Laterality: N/A;  . PENILE PROSTHESIS IMPLANT  04/12/2010   Dr.Grapey, Insertion of 3 peice penile prosthesis   . PENILE PROSTHESIS IMPLANT N/A 01/24/2015   Procedure: EXPLANTATION OF PENILE PROSTHESIS;  Surgeon: Bernestine Amass, MD;  [facility]: [facility];  Service: Urology;  Laterality: N/A;   Family History  Problem Relation Age of Onset  . Hypertension Mother   . Diabetes Mother   . Stroke Father   . Cancer Father        colon  . Diabetes Sister   . Diabetes Sister   . Diabetes Brother    Social History   Socioeconomic History  . Marital status: Married    Spouse name: Not on file  . Number of children: Not on file  . Years of education: Not on file  . Highest education level: Not on file  Occupational History  . Not on file  Social Needs  . Financial resource strain: Not hard at all  . Food insecurity    Worry: Never true    Inability: Never true  . Transportation needs    Medical: No    Non-medical: No  Tobacco Use  . Smoking status: Former Smoker    Years: 5.00    Quit date: 12/17/2013    Years since quitting: 5.5  . Smokeless tobacco:  Never Used  Substance and Sexual Activity  . Alcohol use: Not Currently    Alcohol/week: 0.0 standard drinks    Comment:  not anymore  . Drug use: Not Currently    Types: Marijuana    Comment: a joint a day to ease pain  . Sexual activity: Yes  Lifestyle  . Physical activity    Days per week: 0 days    Minutes per session: 0 min  . Stress: Only a little  Relationships  . Social connections    Talks on phone: More than three times a week    Gets together: Never    Attends religious service: More than 4 times per year    Active member of club or organization: No    Attends meetings of clubs or organizations: Never    Relationship status: Married  Other Topics Concern  . Not on file  Social History Narrative  . Not on file    Outpatient Encounter Medications as of 07/17/2019  Medication Sig  . aspirin EC 325 MG tablet  Take 1 tablet (325 mg total) by mouth daily.  Marland Kitchen. atorvastatin (LIPITOR) 20 MG tablet Take 1 tablet (20 mg total) by mouth daily at 6 PM. For Cholesterol  . bimatoprost (LUMIGAN) 0.03 % ophthalmic solution Place 1 drop into both eyes at bedtime.  . clotrimazole-betamethasone (LOTRISONE) cream apply to affected area twice a day  . Docusate Sodium (DSS) 100 MG CAPS Take 100 mg by mouth 2 (two) times daily.  . hydrochlorothiazide (HYDRODIURIL) 50 MG tablet Take 1 tablet (50 mg total) by mouth daily with breakfast.  . oxyCODONE (ROXICODONE) 15 MG immediate release tablet Take 1 tablet (15 mg total) by mouth every 6 (six) hours as needed for pain.   No facility-administered encounter medications on file as of 07/17/2019.     Activities of Daily Living No flowsheet data found.  Patient Care Team: Sharon SellerEubanks, Arnaldo Heffron K, NP as PCP - General (Geriatric Medicine) Marvel PlanXu, Jindong, MD as Consulting Physician (Neurology)   Assessment:   This is a routine wellness examination for Nathan Buchanan.  Exercise Activities and Dietary recommendations    Goals    . <enter goal here>     Starting 01/10/17, I will maintain my current lifestyle.        Fall Risk Fall Risk  07/17/2019 07/17/2019 04/16/2019 01/29/2019 01/22/2019  Falls in the past year? 0 0 0 0 1  Comment - - - - -  Number falls in past yr: - - 0 - 1  Injury with Fall? 0 - 0 - 0  Comment - - - - -   Is the patient's home free of loose throw rugs in walkways, pet beds, electrical cords, etc?   yes      Grab bars in the bathroom? no      Handrails on the stairs?   no      Adequate lighting?   yes  Timed Get Up and Go Performed: na  Depression Screen PHQ 2/9 Scores 07/17/2019 07/17/2019 04/16/2019 01/13/2019  PHQ - 2 Score 0 0 0 2  PHQ- 9 Score - - - 10    Cognitive Function MMSE - Mini Mental State Exam 01/10/2017  Orientation to time 5  Orientation to Place 5  Registration 3  Attention/ Calculation 3  Recall 3  Language- name 2 objects 2  Language-  repeat 1  Language- follow 3 step command 3  Language- read & follow direction 1  Write a sentence 0  Copy design 0  Total score 26     6CIT Screen 07/17/2019  What Year? 0 points  What month? 0 points  What time? 0 points  Count back from 20 0 points  Months in reverse 4 points  Repeat phrase 10 points  Total Score 14    Immunization History  Administered Date(s) Administered  . Influenza Whole 10/17/2006  . Influenza, Quadrivalent, Recombinant, Inj, Pf 10/01/2018  . Influenza,inj,Quad PF,6+ Mos 09/12/2016  . Influenza-Unspecified 08/21/2013, 11/16/2014, 07/29/2015, 08/17/2017, 09/20/2017, 10/08/2018  . Pneumococcal-Unspecified 11/16/2014  . Rabies, IM 10/22/2012  . Rabies, intradermal 10/22/2012  . Tdap 10/22/2012  . Zoster Recombinat (Shingrix) 10/25/2017    Qualifies for Shingles Vaccine? Yes  Screening Tests Health Maintenance  Topic Date Due  . HIV Screening  08/16/2019 (Originally 10/30/1971)  . INFLUENZA VACCINE  07/18/2019  . Fecal DNA (Cologuard)  08/07/2021  . TETANUS/TDAP  10/22/2022  . Hepatitis C Screening  Completed   Cancer Screenings: Lung: Low Dose CT Chest recommended if Age 27-80 years, 30 pack-year currently smoking OR have quit w/in 15years. Patient does not qualify. Colorectal: up to date  Additional Screenings: Hepatitis C Screening: done      Plan:     I have personally reviewed and noted the following in the patient's chart:   . Medical and social history . Use of alcohol, tobacco or illicit drugs  . Current medications and supplements . Functional ability and status . Nutritional status . Physical activity . Advanced directives . List of other physicians . Hospitalizations, surgeries, and ER visits in previous 12 months . Vitals . Screenings to include cognitive, depression, and falls . Referrals and appointments  In addition, I have reviewed and discussed with patient certain preventive protocols, quality metrics, and best  practice recommendations. A written personalized care plan for preventive services as well as general preventive health recommendations were provided to patient.     Sharon SellerJessica K Isys Tietje, NP  07/17/2019

## 2019-07-17 NOTE — Progress Notes (Signed)
This service is provided via telemedicine  No vital signs collected/recorded due to the encounter was a telemedicine visit.   Location of patient (ex: home, work):  Home  Patient consents to a telephone visit:  Yes  Location of the provider (ex: office, home):  Office  Name of any referring provider:  N?A  Names of all persons participating in the telemedicine service and their role in the encounter:  Rutherford Alarie, Ella Bodo NP  Time spent on call:  10 min

## 2019-08-06 ENCOUNTER — Other Ambulatory Visit: Payer: Self-pay

## 2019-08-06 DIAGNOSIS — M159 Polyosteoarthritis, unspecified: Secondary | ICD-10-CM

## 2019-08-06 DIAGNOSIS — G8929 Other chronic pain: Secondary | ICD-10-CM

## 2019-08-07 ENCOUNTER — Other Ambulatory Visit: Payer: Self-pay | Admitting: *Deleted

## 2019-08-07 DIAGNOSIS — M159 Polyosteoarthritis, unspecified: Secondary | ICD-10-CM

## 2019-08-07 DIAGNOSIS — G8929 Other chronic pain: Secondary | ICD-10-CM

## 2019-08-07 MED ORDER — OXYCODONE HCL 15 MG PO TABS: 15.0000 mg | ORAL_TABLET | Freq: Four times a day (QID) | ORAL | 0 refills | Status: DC | PRN

## 2019-08-07 NOTE — Telephone Encounter (Signed)
Last filled 07/08/2019 

## 2019-08-10 ENCOUNTER — Other Ambulatory Visit: Payer: Self-pay

## 2019-08-10 ENCOUNTER — Other Ambulatory Visit: Payer: Medicare Other

## 2019-08-10 DIAGNOSIS — E782 Mixed hyperlipidemia: Secondary | ICD-10-CM

## 2019-08-10 DIAGNOSIS — I10 Essential (primary) hypertension: Secondary | ICD-10-CM

## 2019-08-11 LAB — COMPLETE METABOLIC PANEL WITH GFR
AG Ratio: 1.2 (calc) (ref 1.0–2.5)
ALT: 20 U/L (ref 9–46)
AST: 32 U/L (ref 10–35)
Albumin: 4.2 g/dL (ref 3.6–5.1)
Alkaline phosphatase (APISO): 68 U/L (ref 35–144)
BUN: 16 mg/dL (ref 7–25)
CO2: 28 mmol/L (ref 20–32)
Calcium: 9 mg/dL (ref 8.6–10.3)
Chloride: 104 mmol/L (ref 98–110)
Creat: 1.1 mg/dL (ref 0.70–1.25)
GFR, Est African American: 83 mL/min/{1.73_m2} (ref >=60)
GFR, Est Non African American: 72 mL/min/{1.73_m2} (ref >=60)
Globulin: 3.4 g/dL (calc) (ref 1.9–3.7)
Glucose, Bld: 97 mg/dL (ref 65–99)
Potassium: 3.4 mmol/L — ABNORMAL LOW (ref 3.5–5.3)
Sodium: 139 mmol/L (ref 135–146)
Total Bilirubin: 0.4 mg/dL (ref 0.2–1.2)
Total Protein: 7.6 g/dL (ref 6.1–8.1)

## 2019-08-11 LAB — CBC WITH DIFFERENTIAL/PLATELET
Absolute Monocytes: 672 cells/uL (ref 200–950)
Basophils Absolute: 84 cells/uL (ref 0–200)
Basophils Relative: 1 %
Eosinophils Absolute: 59 cells/uL (ref 15–500)
Eosinophils Relative: 0.7 %
HCT: 40.2 % (ref 38.5–50.0)
Hemoglobin: 13.6 g/dL (ref 13.2–17.1)
Lymphs Abs: 5729 cells/uL — ABNORMAL HIGH (ref 850–3900)
MCH: 30.9 pg (ref 27.0–33.0)
MCHC: 33.8 g/dL (ref 32.0–36.0)
MCV: 91.4 fL (ref 80.0–100.0)
MPV: 10.4 fL (ref 7.5–12.5)
Monocytes Relative: 8 %
Neutro Abs: 1856 cells/uL (ref 1500–7800)
Neutrophils Relative %: 22.1 %
Platelets: 196 10*3/uL (ref 140–400)
RBC: 4.4 10*6/uL (ref 4.20–5.80)
RDW: 13.1 % (ref 11.0–15.0)
Total Lymphocyte: 68.2 %
WBC: 8.4 10*3/uL (ref 3.8–10.8)

## 2019-09-07 ENCOUNTER — Other Ambulatory Visit: Payer: Self-pay | Admitting: *Deleted

## 2019-09-07 DIAGNOSIS — M159 Polyosteoarthritis, unspecified: Secondary | ICD-10-CM

## 2019-09-07 DIAGNOSIS — G8929 Other chronic pain: Secondary | ICD-10-CM

## 2019-09-07 MED ORDER — OXYCODONE HCL 15 MG PO TABS: 15.0000 mg | ORAL_TABLET | Freq: Four times a day (QID) | ORAL | 0 refills | Status: DC | PRN

## 2019-09-07 NOTE — Telephone Encounter (Signed)
Patient requested refill Epic LR: 08/07/19 Pended Rx and sent to [facility] for approval.

## 2019-10-01 ENCOUNTER — Encounter: Payer: Self-pay | Admitting: Nurse Practitioner

## 2019-10-06 ENCOUNTER — Other Ambulatory Visit: Payer: Self-pay | Admitting: *Deleted

## 2019-10-06 DIAGNOSIS — G8929 Other chronic pain: Secondary | ICD-10-CM

## 2019-10-06 DIAGNOSIS — M159 Polyosteoarthritis, unspecified: Secondary | ICD-10-CM

## 2019-10-06 MED ORDER — OXYCODONE HCL 15 MG PO TABS: 15.0000 mg | ORAL_TABLET | Freq: Four times a day (QID) | ORAL | 0 refills | Status: DC | PRN

## 2019-10-06 NOTE — Telephone Encounter (Signed)
Patient requested Epic LR: 09/07/19 Pended Rx and sent to [facility] for approval.

## 2019-11-05 ENCOUNTER — Other Ambulatory Visit: Payer: Self-pay | Admitting: *Deleted

## 2019-11-05 DIAGNOSIS — M159 Polyosteoarthritis, unspecified: Secondary | ICD-10-CM

## 2019-11-05 DIAGNOSIS — G8929 Other chronic pain: Secondary | ICD-10-CM

## 2019-11-05 NOTE — Telephone Encounter (Signed)
Not Due till 11/06/2019  Patient requested refill. [place] Verified  LR: 10/07/2019 Pended Rx and sent to [facility] for approval.   Needs updated Narcotic Contract signed.  Appointment scheduled with patient for 11/11/2019 with [place].

## 2019-11-06 ENCOUNTER — Telehealth: Payer: Self-pay

## 2019-11-06 MED ORDER — OXYCODONE HCL 15 MG PO TABS: 15.0000 mg | ORAL_TABLET | Freq: Four times a day (QID) | ORAL | 0 refills | Status: DC | PRN

## 2019-11-06 NOTE — Telephone Encounter (Signed)
We will discuss this at his upcoming appt.

## 2019-11-06 NOTE — Telephone Encounter (Signed)
Patient called to request if his refill for Oxycodone had been filled I told Him I would check to see if a request had been filled for his medication I didn't see one so I went to ask Rodena Piety and she said she has already sent it to [facility] and she was waiting to hear from her and that the patient has been calling since the 18 or 19 about pain medication

## 2019-11-06 NOTE — Telephone Encounter (Signed)
Sent to [place] for approval.

## 2019-11-06 NOTE — Telephone Encounter (Signed)
Called patient to tell him that Nathan Buchanan would discuss this with him at his visit on 11/25. He asked if Nathan Buchanan cut him back. I told him I didn't know, but that Nathan Buchanan would discuss this with him at his appointment.

## 2019-11-06 NOTE — Telephone Encounter (Addendum)
Patient called from [facility] and was upset that he only got 90 tablets, that he usually gets 120 tablets, has "for years", and that 90 tablets is not going to last. He has an appointment on 11/25 to sign a new opioid contract.    I didn't see anything in your last note about reducing dosing.

## 2019-11-11 ENCOUNTER — Encounter: Payer: Self-pay | Admitting: Nurse Practitioner

## 2019-11-11 ENCOUNTER — Other Ambulatory Visit: Payer: Self-pay

## 2019-11-11 ENCOUNTER — Ambulatory Visit: Payer: Medicare Other | Admitting: Family

## 2019-11-11 ENCOUNTER — Ambulatory Visit (INDEPENDENT_AMBULATORY_CARE_PROVIDER_SITE_OTHER): Payer: Medicare Other | Admitting: Nurse Practitioner

## 2019-11-11 VITALS — BP 132/88 | HR 98 | Temp 98.2°F | Ht 71.0 in | Wt 149.0 lb

## 2019-11-11 DIAGNOSIS — M8949 Other hypertrophic osteoarthropathy, multiple sites: Secondary | ICD-10-CM | POA: Diagnosis not present

## 2019-11-11 DIAGNOSIS — M5440 Lumbago with sciatica, unspecified side: Secondary | ICD-10-CM | POA: Diagnosis not present

## 2019-11-11 DIAGNOSIS — M159 Polyosteoarthritis, unspecified: Secondary | ICD-10-CM

## 2019-11-11 DIAGNOSIS — G8929 Other chronic pain: Secondary | ICD-10-CM

## 2019-11-11 MED ORDER — OXYCODONE HCL 15 MG PO TABS: 15.0000 mg | ORAL_TABLET | Freq: Three times a day (TID) | ORAL | 0 refills | Status: DC | PRN

## 2019-11-11 NOTE — Patient Instructions (Addendum)
To make follow up with Dr Letta Pate  [facility] Address: [address], [place], [place] [address]  Phone: [phone] -- to call and make follow up appt asap to help manage pain.   To decrease oxycodone 15 mg to every 8 hours.  We will decrease your oxycodone to 10 mg every 8 hours at next refill to reduce your risk of an adverse drug reaction. We will have you work with physical medicine at this time or we can refer to you neurosurgery to help manage symptoms.  It is important for you to take your medication as prescribed and not take more than directed.   To keep your follow up appt in January

## 2019-11-11 NOTE — Progress Notes (Signed)
Careteam: Patient Care Team: Sharon SellerEubanks, Onix Jumper K, NP as PCP - General (Geriatric Medicine) Marvel PlanXu, Jindong, MD as Consulting Physician (Neurology)  Advanced Directive information    No Known Allergies  Chief Complaint  Patient presents with  . Medication Management    Review medications and update opioid treatment agreement      HPI: Patient is a 63 y.o. male seen in the office today for pain management.  Reports he has pain all the time in lower back.  Occasionally will have numbness and tingling right leg.  Reports he is taking up to 6 pain pills a day.  Does not take any other medication or ilicit drugs or ETOH.   Saw Dr Doroteo BradfordKirstein in January for chronic low back pain, imaging updated which showed OA. He was a good candidate for lumbar medial branch block. Appeared he only had Bilateral Lumbar L3, L4  medial branch blocks and L 5 dorsal ramus injection under fluoroscopic guidance and reports results were not favorable but never followed up.   Review of Systems:  Review of Systems  Constitutional: Negative for chills and fever.  Musculoskeletal: Positive for back pain, joint pain and myalgias.    Past Medical History:  Diagnosis Date  . Arthritis   . Back ache   . Bipolar I disorder, most recent episode (or current) unspecified    no meds at present  . Chronic hepatitis C without mention of hepatic coma    tx. with meds/ was told" was cured"  . Cirrhosis of liver without mention of alcohol   . Dermatophytosis of the body   . Generalized osteoarthrosis, involving multiple sites   . Hyperlipidemia   . Hypertension   . Osteoarthrosis, unspecified whether generalized or localized, unspecified site   . Stroke [facility](HCC)     12'15 -Right sided weakness remains  . Unspecified glaucoma(365.9)   . Urinary frequency    Past Surgical History:  Procedure Laterality Date  . ABCESS DRAINAGE     Gluteal MRSA drainage   . ANAL FISSURE REPAIR  2008  . COLONOSCOPY  2008   Dr.Edwards, Internal Hemorrhoids   . CYSTOSCOPY N/A 01/24/2015   Procedure: CYSTOSCOPY FLEXIBLE;  Surgeon: Valetta Fulleravid S Grapey, MD;  [facility];  Service: Urology;  Laterality: N/A;  . PENILE PROSTHESIS IMPLANT  04/12/2010   Dr.Grapey, Insertion of 3 peice penile prosthesis   . PENILE PROSTHESIS IMPLANT N/A 01/24/2015   Procedure: EXPLANTATION OF PENILE PROSTHESIS;  Surgeon: Valetta Fulleravid S Grapey, MD;  Location: [facility];  Service: Urology;  Laterality: N/A;   Social History:   reports that he quit smoking about 5 years ago. He quit after 5.00 years of use. He has never used smokeless tobacco. He reports previous alcohol use. He reports previous drug use. Drug: Marijuana.  Family History  Problem Relation Age of Onset  . Hypertension Mother   . Diabetes Mother   . Stroke Father   . Cancer Father        colon  . Diabetes Sister   . Diabetes Sister   . Diabetes Brother     Medications: Patient's Medications  New Prescriptions   No medications on file  Previous Medications   ASPIRIN EC 325 MG TABLET    Take 1 tablet (325 mg total) by mouth daily.   ATORVASTATIN (LIPITOR) 20 MG TABLET    Take 1 tablet (20 mg total) by mouth daily at 6 PM. For Cholesterol   BIMATOPROST (LUMIGAN) 0.03 % OPHTHALMIC SOLUTION  Place 1 drop into both eyes at bedtime.   CLOTRIMAZOLE-BETAMETHASONE (LOTRISONE) CREAM    apply to affected area twice a day   DOCUSATE SODIUM (DSS) 100 MG CAPS    Take 100 mg by mouth 2 (two) times daily.   HYDROCHLOROTHIAZIDE (HYDRODIURIL) 50 MG TABLET    Take 1 tablet (50 mg total) by mouth daily with breakfast.   OXYCODONE (ROXICODONE) 15 MG IMMEDIATE RELEASE TABLET    Take 1 tablet (15 mg total) by mouth every 6 (six) hours as needed for pain.  Modified Medications   No medications on file  Discontinued Medications   No medications on file    Physical Exam:  Vitals:   11/11/19 1033  BP: 132/88  Pulse: 98  Temp: 98.2 F (36.8 C)  TempSrc: Temporal  SpO2: 96%  Weight: 149 lb  (67.6 kg)  Height: 5\' 11"  (1.803 m)   Body mass index is 20.78 kg/m. Wt Readings from Last 3 Encounters:  11/11/19 149 lb (67.6 kg)  01/29/19 163 lb (73.9 kg)  01/22/19 163 lb 12.8 oz (74.3 kg)    Physical Exam Constitutional:      Appearance: Normal appearance. He is well-developed.  HENT:     Head:     Comments: Right sided facial drop   Eyes:     General: No scleral icterus.    Pupils: Pupils are equal, round, and reactive to light.  Neck:     Thyroid: No thyromegaly.  Abdominal:     General: There is no abdominal bruit.     Palpations: Abdomen is not rigid. There is no hepatomegaly or pulsatile mass.  Musculoskeletal:        General: Deformity present.  Skin:    General: Skin is warm and dry.     Findings: No rash.  Neurological:     Mental Status: He is alert and oriented to person, place, and time.     Gait: Gait abnormal.     Comments: Right hemiparesis  Psychiatric:        Behavior: Behavior normal.        Thought Content: Thought content normal.     Labs reviewed: Basic Metabolic Panel: Recent Labs    01/22/19 1105 08/10/19 0929  NA 139 139  K 4.2 3.4*  CL 103 104  CO2 29 28  GLUCOSE 89 97  BUN 22 16  CREATININE 1.04 1.10  CALCIUM 9.2 9.0   Liver Function Tests: Recent Labs    01/22/19 1105 08/10/19 0929  AST 32 32  ALT 28 20  BILITOT 0.5 0.4  PROT 7.8 7.6   No results for input(s): LIPASE, AMYLASE in the last 8760 hours. No results for input(s): AMMONIA in the last 8760 hours. CBC: Recent Labs    01/22/19 1105 08/10/19 0929  WBC 13.0* 8.4  NEUTROABS 1,716 1,856  HGB 14.0 13.6  HCT 41.1 40.2  MCV 90.5 91.4  PLT 199 196   Lipid Panel: Recent Labs    01/22/19 1105 05/22/19 0818  CHOL 191 115  HDL 53 49  LDLCALC 113* 48  TRIG 141 95  CHOLHDL 3.6 2.3   TSH: No results for input(s): TSH in the last 8760 hours. A1C: Lab Results  Component Value Date   HGBA1C 5.5 10/15/2018     Assessment/Plan 1. Chronic left-sided  low back pain with sciatica, sciatica laterality unspecified -bedside risk calculator puts him at 30% avg probability of overdose or serious opiod induced respiratory depression over the next 6 months. This  is without being on antidepressant and he states he has on a psych medication prescribed by the [place] and unsure what that is. Being on an antidepressant increases his risk to 55%. Discussed with him the need to cut back on his oxycodone, as he is taking more to get "releif" and this put him at risk of adverse drug reaction. Educated him on the importance of taking medication as prescribed and the risk of OD and respiratory depression by increasing frequency and dosing. Today decreasing the frequency of tablets to every 8 hours. On his next refill will reduce oxycodone to 10 mg every 8 hours. He is encouraged to make follow up with physical medicine with Dr Read Drivers. If this is not beneficial can also consult ortho or neurosurgery to help with management of pain. - Pain Mgmt, Profile 1 w/o Conf, U -pain contract updated today.   Next appt: 01/15/2020 as scheduled.  Carlos American. [place], [facility] [phone]

## 2019-11-12 LAB — PAIN MGMT, PROFILE 1 W/O CONF, U
Amphetamines: NEGATIVE ng/mL
Barbiturates: NEGATIVE ng/mL
Benzodiazepines: NEGATIVE ng/mL
Cocaine Metabolite: NEGATIVE ng/mL
Creatinine: 123.8 mg/dL
Marijuana Metabolite: POSITIVE ng/mL
Methadone Metabolite: NEGATIVE ng/mL
Opiates: POSITIVE ng/mL
Oxidant: NEGATIVE ug/mL
Oxycodone: POSITIVE ng/mL
Phencyclidine: NEGATIVE ng/mL
pH: 5.4 (ref 4.5–9.0)

## 2019-11-16 ENCOUNTER — Other Ambulatory Visit: Payer: Self-pay | Admitting: Nurse Practitioner

## 2019-11-16 DIAGNOSIS — M5440 Lumbago with sciatica, unspecified side: Secondary | ICD-10-CM

## 2019-11-16 DIAGNOSIS — G8929 Other chronic pain: Secondary | ICD-10-CM

## 2019-11-24 ENCOUNTER — Other Ambulatory Visit: Payer: Self-pay

## 2019-11-24 NOTE — Patient Outreach (Signed)
[facility]) Care Management  11/24/2019  Nathan Buchanan 1956/06/01 [id number]   Medication Adherence call to Nathan Buchanan Compliant Voice message left with a call back number. Nathan Buchanan is showing past due on Atorvastatin 20 mg under [place].   [place] Management Direct Dial [phone]  Fax [phone] [email or web address]

## 2020-01-15 ENCOUNTER — Telehealth (INDEPENDENT_AMBULATORY_CARE_PROVIDER_SITE_OTHER): Payer: Medicare Other | Admitting: Nurse Practitioner

## 2020-01-15 ENCOUNTER — Other Ambulatory Visit: Payer: Self-pay

## 2020-01-15 ENCOUNTER — Telehealth: Payer: Self-pay

## 2020-01-15 DIAGNOSIS — K5903 Drug induced constipation: Secondary | ICD-10-CM

## 2020-01-15 DIAGNOSIS — I69351 Hemiplegia and hemiparesis following cerebral infarction affecting right dominant side: Secondary | ICD-10-CM | POA: Diagnosis not present

## 2020-01-15 DIAGNOSIS — G8929 Other chronic pain: Secondary | ICD-10-CM

## 2020-01-15 DIAGNOSIS — I10 Essential (primary) hypertension: Secondary | ICD-10-CM

## 2020-01-15 DIAGNOSIS — E782 Mixed hyperlipidemia: Secondary | ICD-10-CM

## 2020-01-15 DIAGNOSIS — T402X5A Adverse effect of other opioids, initial encounter: Secondary | ICD-10-CM

## 2020-01-15 DIAGNOSIS — M8949 Other hypertrophic osteoarthropathy, multiple sites: Secondary | ICD-10-CM

## 2020-01-15 DIAGNOSIS — F310 Bipolar disorder, current episode hypomanic: Secondary | ICD-10-CM

## 2020-01-15 DIAGNOSIS — M5440 Lumbago with sciatica, unspecified side: Secondary | ICD-10-CM

## 2020-01-15 DIAGNOSIS — M159 Polyosteoarthritis, unspecified: Secondary | ICD-10-CM

## 2020-01-15 MED ORDER — DULOXETINE HCL 30 MG PO CPEP: 30.0000 mg | ORAL_CAPSULE | Freq: Every day | ORAL | 1 refills | Status: DC

## 2020-01-15 NOTE — Patient Instructions (Addendum)
To start Cymbalta to help with mood, also can benefit back pain  Tylenol 500 mg 1-2 tablets every 8 hours as needed for pain- MAX 6 tablets in 24 hours.   Call the [facility] and set up an appt with their psychiatrist.   Major Depressive Disorder, Adult Major depressive disorder (MDD) is a mental health condition. MDD often makes you feel sad, hopeless, or helpless. MDD can also cause symptoms in your body. MDD can affect your:  Work.  School.  Relationships.  Other normal activities. MDD can range from mild to very bad. It may occur once (single episode MDD). It can also occur many times (recurrent MDD). The main symptoms of MDD often include:  Feeling sad, depressed, or irritable most of the time.  Loss of interest. MDD symptoms also include:  Sleeping too much or too little.  Eating too much or too little.  A change in your weight.  Feeling tired (fatigue) or having low energy.  Feeling worthless.  Feeling guilty.  Trouble making decisions.  Trouble thinking clearly.  Thoughts of suicide or harming others.  Feeling weak.  Feeling agitated.  Keeping yourself from being around other people (isolation). Follow these instructions at home: Activity  Do these things as told by your doctor: ? Go back to your normal activities. ? Exercise regularly. ? Spend time outdoors. Alcohol  Talk with your doctor about how alcohol can affect your antidepressant medicines.  Do not drink alcohol. Or, limit how much alcohol you drink. ? This means no more than 1 drink a day for nonpregnant women and 2 drinks a day for men. One drink equals one of these:  12 oz of beer.  5 oz of wine.  1 oz of hard liquor. General instructions  Take over-the-counter and prescription medicines only as told by your doctor.  Eat a healthy diet.  Get plenty of sleep.  Find activities that you enjoy. Make time to do them.  Think about joining a support group. Your doctor may be able to  suggest a group for you.  Keep all follow-up visits as told by your doctor. This is important. Where to find more information:  [employer] on Mental Illness: ? www.nami.org  [place] [employer] of Mental Health: ? http://www.maynard.net/  National Suicide Prevention Lifeline: ? 916 818 0853. This is free, 24-hour help. Contact a doctor if:  Your symptoms get worse.  You have new symptoms. Get help right away if:  You self-harm.  You see, hear, taste, smell, or feel things that are not present (hallucinate). If you ever feel like you may hurt yourself or others, or have thoughts about taking your own life, get help right away. You can go to your nearest emergency department or call:  Your local emergency services (911 in the [place]  A suicide crisis helpline, such as the National Suicide Prevention Lifeline: ? (629)296-8776. This is open 24 hours a day. This information is not intended to replace advice given to you by your health care provider. Make sure you discuss any questions you have with your health care provider. Document Revised: 11/15/2017 Document Reviewed: 08/19/2016 Elsevier Patient Education  2020 [employer].

## 2020-01-15 NOTE — Telephone Encounter (Signed)
Per Sharon Seller, NP  Call [facility] and cancel rx sent for Cymbalta sent in error   I called the pharmacy and canceled order as directed.

## 2020-01-15 NOTE — Progress Notes (Signed)
This service is provided via telemedicine  No vital signs collected/recorded due to the encounter was a telemedicine visit.   Location of patient (ex: home, work):  Home   Patient consents to a telephone visit:  Yes   Location of the provider (ex: office, home):  [facility], Office   Name of any referring provider:  N/A  Names of all persons participating in the telemedicine service and their role in the encounter:  S.Chrae B/CMA, Sherrie Mustache, NP, and Patient   Time spent on call:  7 min with medical assistant      Careteam: Patient Care Team: Lauree Chandler, NP as PCP - General (Geriatric Medicine) Rosalin Hawking, MD as Consulting Physician (Neurology) Letta Pate Luanna Salk, MD as Consulting Physician (Physical Medicine and Rehabilitation)  Advanced Directive information Does Patient Have a Medical Advance Directive?: No, Would patient like information on creating a medical advance directive?: No - Patient declined  No Known Allergies  Chief Complaint  Patient presents with  . Medical Management of Chronic Issues    6 month follow-up  . Advanced Directive    No ACP on file, Advance Care Planning.   Marland Kitchen Best Practice Recommendations    Discuss HIV Screening      HPI: Patient is a 63 y.o. male for 6 month follow up- unable to connect with video visit due due to technical difficulties.   Reports he is staying in the bed all day, does not feel like doing much Has back pain and wont feel like doing much States he has back pay to disability and not able to pay his bills therefore he is not able to go any more doctors or pay co-pays.   Hyperlipidemia- Lipitor has been prescribed however last refill was 06/12/19 #90 with 1, likely not taking as prescribed, last LDL was at goal in June. Reports he also gets medication from the [place]. Has 2 bottles at home now.   Medication noncompliance- patient outreach attempted to contact patient but he did not call  back  Hypertension- does not take blood pressure at home,"not been feeling bad" continues on HCTZ daily  Constipation- resolved since not on pain medication.   Admits to feeling on depression, does not want to talk to anyone. No thought of HI or SI.  Sits along a lot and now pay has been cut.  Also has hx of bipolar dx but not on medications. Reports frequent mood swings.   CVA- continues on ASA 325 mg daily no further deficits.   Review of Systems:  Review of Systems  Constitutional: Negative for chills, fever and weight loss.  HENT: Negative for tinnitus.   Respiratory: Negative for cough, sputum production and shortness of breath.   Cardiovascular: Negative for chest pain, palpitations and leg swelling.  Gastrointestinal: Negative for abdominal pain, constipation, diarrhea and heartburn.  Genitourinary: Negative for dysuria, frequency and urgency.  Musculoskeletal: Positive for back pain and joint pain. Negative for falls and myalgias.  Skin: Negative.   Neurological: Negative for dizziness and headaches.  Psychiatric/Behavioral: Positive for depression (hx of bipolar). Negative for memory loss. The patient does not have insomnia.     Past Medical History:  Diagnosis Date  . Arthritis   . Back ache   . Bipolar I disorder, most recent episode (or current) unspecified    no meds at present  . Chronic hepatitis C without mention of hepatic coma    tx. with meds/ was told" was cured"  . Cirrhosis  of liver without mention of alcohol   . Dermatophytosis of the body   . Generalized osteoarthrosis, involving multiple sites   . Hyperlipidemia   . Hypertension   . Osteoarthrosis, unspecified whether generalized or localized, unspecified site   . Stroke [facility])     12'15 -Right sided weakness remains  . Unspecified glaucoma(365.9)   . Urinary frequency    Past Surgical History:  Procedure Laterality Date  . ABCESS DRAINAGE     Gluteal MRSA drainage   . ANAL FISSURE REPAIR  2008   . COLONOSCOPY  2008   Dr.Edwards, Internal Hemorrhoids   . CYSTOSCOPY N/A 01/24/2015   Procedure: CYSTOSCOPY FLEXIBLE;  Surgeon: Valetta Fuller, MD;  Location: [facility];  Service: Urology;  Laterality: N/A;  . PENILE PROSTHESIS IMPLANT  04/12/2010   Dr.Grapey, Insertion of 3 peice penile prosthesis   . PENILE PROSTHESIS IMPLANT N/A 01/24/2015   Procedure: EXPLANTATION OF PENILE PROSTHESIS;  Surgeon: Valetta Fuller, MD;  Location: [facility];  Service: Urology;  Laterality: N/A;   Social History:   reports that he quit smoking about 6 years ago. He quit after 5.00 years of use. He has never used smokeless tobacco. He reports previous alcohol use. He reports previous drug use. Drug: Marijuana.  Family History  Problem Relation Age of Onset  . Hypertension Mother   . Diabetes Mother   . Stroke Father   . Cancer Father        colon  . Diabetes Sister   . Diabetes Sister   . Diabetes Brother     Medications: Patient's Medications  New Prescriptions   No medications on file  Previous Medications   ASPIRIN EC 325 MG TABLET    Take 1 tablet (325 mg total) by mouth daily.   ATORVASTATIN (LIPITOR) 20 MG TABLET    Take 1 tablet (20 mg total) by mouth daily at 6 PM. For Cholesterol   BIMATOPROST (LUMIGAN) 0.03 % OPHTHALMIC SOLUTION    Place 1 drop into both eyes at bedtime.   CLOTRIMAZOLE-BETAMETHASONE (LOTRISONE) CREAM    apply to affected area twice a day   DOCUSATE SODIUM (DSS) 100 MG CAPS    Take 100 mg by mouth 2 (two) times daily.   HYDROCHLOROTHIAZIDE (HYDRODIURIL) 50 MG TABLET    Take 1 tablet (50 mg total) by mouth daily with breakfast.  Modified Medications   No medications on file  Discontinued Medications   OXYCODONE (ROXICODONE) 15 MG IMMEDIATE RELEASE TABLET    Take 1 tablet (15 mg total) by mouth every 8 (eight) hours as needed for pain.    Physical Exam:  There were no vitals filed for this visit. There is no height or weight on file to calculate BMI. Wt Readings from Last 3  Encounters:  11/11/19 149 lb (67.6 kg)  01/29/19 163 lb (73.9 kg)  01/22/19 163 lb 12.8 oz (74.3 kg)     Labs reviewed: Basic Metabolic Panel: Recent Labs    01/22/19 1105 08/10/19 0929  NA 139 139  K 4.2 3.4*  CL 103 104  CO2 29 28  GLUCOSE 89 97  BUN 22 16  CREATININE 1.04 1.10  CALCIUM 9.2 9.0   Liver Function Tests: Recent Labs    01/22/19 1105 08/10/19 0929  AST 32 32  ALT 28 20  BILITOT 0.5 0.4  PROT 7.8 7.6   No results for input(s): LIPASE, AMYLASE in the last 8760 hours. No results for input(s): AMMONIA in the last 8760 hours. CBC:  Recent Labs    01/22/19 1105 08/10/19 0929  WBC 13.0* 8.4  NEUTROABS 1,716 1,856  HGB 14.0 13.6  HCT 41.1 40.2  MCV 90.5 91.4  PLT 199 196   Lipid Panel: Recent Labs    01/22/19 1105 05/22/19 0818  CHOL 191 115  HDL 53 49  LDLCALC 113* 48  TRIG 141 95  CHOLHDL 3.6 2.3   TSH: No results for input(s): TSH in the last 8760 hours. A1C: Lab Results  Component Value Date   HGBA1C 5.5 10/15/2018     Assessment/Plan 1. Hemiparesis affecting right side as late effect of cerebrovascular accident (HCC) -stable continues on ASA 325 mg daily. LDL controlled on last labs  2. Bipolar affective disorder, current episode hypomanic (HCC) Reports worsening depression, not on any medication for bipolar disorder and reports he has outburst a few times a week. He is agreeable to see psychiatrist at the [facility] and states he will make an appt with them to help manage his symptoms better.   3. Essential hypertension Has not taken blood pressure today, reviewed flow sheet and generally well controlled educated bp should be <140/90, dash diet recommended and to continue current medication.   4. Primary osteoarthritis involving multiple joints Ongoing, encouraged use of tylenol 500 mg 1-2 tablets every 8 hours as needed  5. Chronic left-sided low back pain with sciatica, sciatica laterality unspecified -with some increase in pain,  no longer getting oxycodone due to mariguana positive on drug screen. Did not wish to go to pain management or neurosurgery due to cost. Encouraged tylenol 500 mg 1-2 tablets every 8 hours as needed for pain, PT also an option but can not afford  6. Mixed hyperlipidemia Continues on statin, reports he gets medication from the [place] and has been compliant with taking. Will follow up blood work.   7. Constipation due to opioid therapy -resolved since no longer on oxycodone.   Next appt: labs due at this time and follow up in 4 months.  Janene Harvey. Biagio Borg  [facility] [phone]    Virtual Visit via Telephone Note  I connected with pt on 01/15/20 at 10:00 AM EST by telephone and verified that I am speaking with the correct person using two identifiers.  Location: Patient: home Provider: office    I discussed the limitations, risks, security and privacy concerns of performing an evaluation and management service by telephone and the availability of in person appointments. I also discussed with the patient that there may be a patient responsible charge related to this service. The patient expressed understanding and agreed to proceed.   I discussed the assessment and treatment plan with the patient. The patient was provided an opportunity to ask questions and all were answered. The patient agreed with the plan and demonstrated an understanding of the instructions.   The patient was advised to call back or seek an in-person evaluation if the symptoms worsen or if the condition fails to improve as anticipated.  I provided 21 minutes of non-face-to-face time during this encounter.  Janene Harvey. Biagio Borg Avs printed and mailed

## 2020-01-21 ENCOUNTER — Other Ambulatory Visit: Payer: Medicare Other

## 2020-03-16 ENCOUNTER — Other Ambulatory Visit: Payer: Medicare Other

## 2020-03-30 ENCOUNTER — Other Ambulatory Visit: Payer: Self-pay

## 2020-03-30 ENCOUNTER — Other Ambulatory Visit: Payer: Medicare Other

## 2020-03-30 DIAGNOSIS — I69351 Hemiplegia and hemiparesis following cerebral infarction affecting right dominant side: Secondary | ICD-10-CM | POA: Diagnosis not present

## 2020-03-30 DIAGNOSIS — E782 Mixed hyperlipidemia: Secondary | ICD-10-CM | POA: Diagnosis not present

## 2020-03-30 DIAGNOSIS — I10 Essential (primary) hypertension: Secondary | ICD-10-CM | POA: Diagnosis not present

## 2020-03-31 LAB — LIPID PANEL
Cholesterol: 141 mg/dL (ref <200)
HDL: 46 mg/dL (ref >=40)
LDL Cholesterol (Calc): 75 mg/dL (calc)
Non-HDL Cholesterol (Calc): 95 mg/dL (calc) (ref <130)
Total CHOL/HDL Ratio: 3.1 (calc) (ref <5.0)
Triglycerides: 112 mg/dL (ref <150)

## 2020-03-31 LAB — COMPLETE METABOLIC PANEL WITH GFR
AG Ratio: 1.2 (calc) (ref 1.0–2.5)
ALT: 25 U/L (ref 9–46)
AST: 29 U/L (ref 10–35)
Albumin: 4.3 g/dL (ref 3.6–5.1)
Alkaline phosphatase (APISO): 73 U/L (ref 35–144)
BUN: 19 mg/dL (ref 7–25)
CO2: 25 mmol/L (ref 20–32)
Calcium: 9.4 mg/dL (ref 8.6–10.3)
Chloride: 107 mmol/L (ref 98–110)
Creat: 1 mg/dL (ref 0.70–1.25)
GFR, Est African American: 92 mL/min/{1.73_m2} (ref >=60)
GFR, Est Non African American: 80 mL/min/{1.73_m2} (ref >=60)
Globulin: 3.5 g/dL (calc) (ref 1.9–3.7)
Glucose, Bld: 110 mg/dL — ABNORMAL HIGH (ref 65–99)
Potassium: 4 mmol/L (ref 3.5–5.3)
Sodium: 139 mmol/L (ref 135–146)
Total Bilirubin: 0.5 mg/dL (ref 0.2–1.2)
Total Protein: 7.8 g/dL (ref 6.1–8.1)

## 2020-03-31 LAB — CBC WITH DIFFERENTIAL/PLATELET
Absolute Monocytes: 631 cells/uL (ref 200–950)
Basophils Absolute: 100 cells/uL (ref 0–200)
Basophils Relative: 1.3 %
Eosinophils Absolute: 31 cells/uL (ref 15–500)
Eosinophils Relative: 0.4 %
HCT: 42.5 % (ref 38.5–50.0)
Hemoglobin: 14.6 g/dL (ref 13.2–17.1)
Lymphs Abs: 4397 cells/uL — ABNORMAL HIGH (ref 850–3900)
MCH: 31.3 pg (ref 27.0–33.0)
MCHC: 34.4 g/dL (ref 32.0–36.0)
MCV: 91.2 fL (ref 80.0–100.0)
MPV: 10.6 fL (ref 7.5–12.5)
Monocytes Relative: 8.2 %
Neutro Abs: 2541 cells/uL (ref 1500–7800)
Neutrophils Relative %: 33 %
Platelets: 217 10*3/uL (ref 140–400)
RBC: 4.66 10*6/uL (ref 4.20–5.80)
RDW: 13 % (ref 11.0–15.0)
Total Lymphocyte: 57.1 %
WBC: 7.7 10*3/uL (ref 3.8–10.8)

## 2020-05-18 ENCOUNTER — Ambulatory Visit: Payer: Medicare Other | Admitting: Nurse Practitioner

## 2020-07-04 ENCOUNTER — Emergency Department (HOSPITAL_COMMUNITY)
Admission: EM | Admit: 2020-07-04 | Discharge: 2020-07-04 | Disposition: A | Discharge status: Home / Self Care | Payer: No Typology Code available for payment source | Attending: Emergency Medicine | Admitting: Emergency Medicine

## 2020-07-04 ENCOUNTER — Other Ambulatory Visit: Payer: Self-pay

## 2020-07-04 ENCOUNTER — Encounter (HOSPITAL_COMMUNITY): Payer: Self-pay | Admitting: *Deleted

## 2020-07-04 ENCOUNTER — Emergency Department (HOSPITAL_COMMUNITY): Payer: No Typology Code available for payment source

## 2020-07-04 DIAGNOSIS — Z79899 Other long term (current) drug therapy: Secondary | ICD-10-CM | POA: Insufficient documentation

## 2020-07-04 DIAGNOSIS — R0789 Other chest pain: Secondary | ICD-10-CM | POA: Insufficient documentation

## 2020-07-04 DIAGNOSIS — Z87891 Personal history of nicotine dependence: Secondary | ICD-10-CM | POA: Insufficient documentation

## 2020-07-04 DIAGNOSIS — I10 Essential (primary) hypertension: Secondary | ICD-10-CM | POA: Insufficient documentation

## 2020-07-04 DIAGNOSIS — Z7982 Long term (current) use of aspirin: Secondary | ICD-10-CM | POA: Insufficient documentation

## 2020-07-04 MED ORDER — NAPROXEN 375 MG PO TABS
375.0000 mg | ORAL_TABLET | Freq: Two times a day (BID) | ORAL | 0 refills | Status: AC | PRN
Start: 2020-07-04 — End: ?

## 2020-07-04 MED ORDER — LIDOCAINE 5 % EX PTCH
1.0000 | MEDICATED_PATCH | CUTANEOUS | Status: DC
  Administered 2020-07-04: 1 via TRANSDERMAL
  Filled 2020-07-04: qty 1

## 2020-07-04 MED ORDER — LIDOCAINE 5 % EX PTCH: 1.0000 | MEDICATED_PATCH | CUTANEOUS | 0 refills | Status: AC

## 2020-07-04 MED ORDER — ACETAMINOPHEN 325 MG PO TABS
650.0000 mg | ORAL_TABLET | Freq: Once | ORAL | Status: AC
  Administered 2020-07-04: 650 mg via ORAL
  Filled 2020-07-04: qty 2

## 2020-07-04 NOTE — Discharge Instructions (Signed)
Take naproxen 2 times a day with meals.  Do not take other anti-inflammatories at the same time (Advil, Motrin, ibuprofen, Aleve). You may supplement with Tylenol if you need further pain control. Use ice packs or heating pads if this helps control your pain. Use the lidocaine patch as needed for pain.  Follow-up with your primary care doctor as needed for further evaluation of your pain. Return to the emergency room if develop fevers, difficulty breathing, or any new, worsening, or concerning symptoms.

## 2020-07-04 NOTE — ED Triage Notes (Signed)
No pain unless he moves in a twisting motion

## 2020-07-04 NOTE — ED Notes (Signed)
Patient verbalizes understanding of discharge instructions. Opportunity for questioning and answers were provided. Armband removed by staff, pt discharged from ED.  

## 2020-07-04 NOTE — ED Triage Notes (Signed)
Pt c/o  Rt lower ribs since yesterday he was lifting heavy boxes
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
## 2020-07-06 ENCOUNTER — Ambulatory Visit: Payer: Medicare Other | Admitting: Nurse Practitioner

## 2020-07-18 ENCOUNTER — Ambulatory Visit: Payer: Medicare Other | Admitting: Nurse Practitioner

## 2020-07-18 ENCOUNTER — Encounter: Payer: Medicare Other | Admitting: Nurse Practitioner

## 2020-07-19 ENCOUNTER — Telehealth: Payer: Self-pay

## 2020-07-19 ENCOUNTER — Other Ambulatory Visit: Payer: Self-pay

## 2020-07-19 ENCOUNTER — Ambulatory Visit (INDEPENDENT_AMBULATORY_CARE_PROVIDER_SITE_OTHER): Payer: Medicare Other | Admitting: Nurse Practitioner

## 2020-07-19 ENCOUNTER — Encounter: Payer: Self-pay | Admitting: Nurse Practitioner

## 2020-07-19 DIAGNOSIS — Z0000 Encounter for general adult medical examination without abnormal findings: Secondary | ICD-10-CM | POA: Diagnosis not present

## 2020-07-19 NOTE — Telephone Encounter (Signed)
Patient had an AWV scheduled for today.  I have called 5 different numbers in hopes of reaching the patient.   1.) [phone] (1st point of contact), not accepting calls at this time  2.) [phone] (wifes home number) continuous busy signal  3.) [phone] (wifes mobile number), service is restricted or unavailable at this number  4.) [phone], (wifes work number per [employer]), a male answered and stated I have the wrong number.  5.) [phone] (mobile number per DPR for patient), no in service.

## 2020-07-19 NOTE — Progress Notes (Signed)
Subjective:   Nathan Buchanan is a 64 y.o. male who presents for Medicare Annual/Subsequent preventive examination.  Review of Systems     Cardiac Risk Factors include: family history of premature cardiovascular disease;hypertension;dyslipidemia;male gender     Objective:    There were no vitals filed for this visit. There is no height or weight on file to calculate BMI.  Advanced Directives 07/19/2020 07/04/2020 01/15/2020 07/17/2019 07/17/2019 04/16/2019 01/22/2019  Does Patient Have a Medical Advance Directive? No No No No No No No  Would patient like information on creating a medical advance directive? - - No - Patient declined No - Patient declined No - Patient declined Yes (MAU/Ambulatory/Procedural Areas - Information given) Yes (ED - Information included in AVS)    Current Medications (verified) Outpatient Encounter Medications as of 07/19/2020  Medication Sig  . aspirin EC 325 MG tablet Take 1 tablet (325 mg total) by mouth daily.  Marland Kitchen atorvastatin (LIPITOR) 20 MG tablet Take 1 tablet (20 mg total) by mouth daily at 6 PM. For Cholesterol  . bimatoprost (LUMIGAN) 0.03 % ophthalmic solution Place 1 drop into both eyes at bedtime.  . clotrimazole-betamethasone (LOTRISONE) cream apply to affected area twice a day  . Docusate Sodium (DSS) 100 MG CAPS Take 100 mg by mouth 2 (two) times daily.  . hydrochlorothiazide (HYDRODIURIL) 50 MG tablet Take 1 tablet (50 mg total) by mouth daily with breakfast.  . lidocaine (LIDODERM) 5 % Place 1 patch onto the skin daily. Remove & Discard patch within 12 hours or as directed by MD  . naproxen (NAPROSYN) 375 MG tablet Take 1 tablet (375 mg total) by mouth 2 (two) times daily as needed.   No facility-administered encounter medications on file as of 07/19/2020.    Allergies (verified) Patient has no known allergies.   History: Past Medical History:  Diagnosis Date  . Arthritis   . Back ache   . Bipolar I disorder, most recent episode (or current)  unspecified    no meds at present  . Chronic hepatitis C without mention of hepatic coma    tx. with meds/ was told" was cured"  . Cirrhosis of liver without mention of alcohol   . Dermatophytosis of the body   . Generalized osteoarthrosis, involving multiple sites   . Hyperlipidemia   . Hypertension   . Osteoarthrosis, unspecified whether generalized or localized, unspecified site   . Stroke [facility])     12'15 -Right sided weakness remains  . Unspecified glaucoma(365.9)   . Urinary frequency    Past Surgical History:  Procedure Laterality Date  . ABCESS DRAINAGE     Gluteal MRSA drainage   . ANAL FISSURE REPAIR  2008  . COLONOSCOPY  2008   Dr.Edwards, Internal Hemorrhoids   . CYSTOSCOPY N/A 01/24/2015   Procedure: CYSTOSCOPY FLEXIBLE;  Surgeon: Valetta Fuller, MD;  Location: [facility];  Service: Urology;  Laterality: N/A;  . PENILE PROSTHESIS IMPLANT  04/12/2010   Dr.Grapey, Insertion of 3 peice penile prosthesis   . PENILE PROSTHESIS IMPLANT N/A 01/24/2015   Procedure: EXPLANTATION OF PENILE PROSTHESIS;  Surgeon: Valetta Fuller, MD;  Location: [facility];  Service: Urology;  Laterality: N/A;   Family History  Problem Relation Age of Onset  . Hypertension Mother   . Diabetes Mother   . Stroke Father   . Cancer Father        colon  . Diabetes Sister   . Diabetes Sister   . Diabetes Brother  Social History   Socioeconomic History  . Marital status: Married    Spouse name: Not on file  . Number of children: Not on file  . Years of education: Not on file  . Highest education level: Not on file  Occupational History  . Not on file  Tobacco Use  . Smoking status: Former Smoker    Years: 5.00    Quit date: 12/17/2013    Years since quitting: 6.5  . Smokeless tobacco: Never Used  Vaping Use  . Vaping Use: Never used  Substance and Sexual Activity  . Alcohol use: Not Currently    Alcohol/week: 0.0 standard drinks    Comment:  not anymore  . Drug use: Not Currently    Types:  Marijuana    Comment: a joint a day to ease pain  . Sexual activity: Yes  Other Topics Concern  . Not on file  Social History Narrative  . Not on file   Social Determinants of Health   Financial Resource Strain:   . Difficulty of Paying Living Expenses:   Food Insecurity:   . Worried About Programme researcher, broadcasting/film/video in the Last Year:   . Barista in the Last Year:   Transportation Needs:   . Freight forwarder (Medical):   Marland Kitchen Lack of Transportation (Non-Medical):   Physical Activity:   . Days of Exercise per Week:   . Minutes of Exercise per Session:   Stress:   . Feeling of Stress :   Social Connections:   . Frequency of Communication with Friends and Family:   . Frequency of Social Gatherings with Friends and Family:   . Attends Religious Services:   . Active Member of Clubs or Organizations:   . Attends Banker Meetings:   Marland Kitchen Marital Status:     Tobacco Counseling Counseling given: Not Answered   Clinical Intake:  Pre-visit preparation completed: Yes  Pain : No/denies pain     BMI - recorded: 20 Nutritional Status: BMI of 19-24  Normal Diabetes: No  How often do you need to have someone help you when you read instructions, pamphlets, or other written materials from your doctor or pharmacy?: 1 - Never  Diabetic?no Interpreter Needed?: No      Activities of Daily Living In your present state of health, do you have any difficulty performing the following activities: 07/19/2020  Hearing? N  Vision? N  Difficulty concentrating or making decisions? N  Walking or climbing stairs? N  Dressing or bathing? N  Doing errands, shopping? N  Preparing Food and eating ? N  Using the Toilet? N  In the past six months, have you accidently leaked urine? N  Do you have problems with loss of bowel control? N  Managing your Medications? N  Managing your Finances? N  Housekeeping or managing your Housekeeping? N  Some recent data might be hidden     Patient Care Team: Sharon Seller, NP as PCP - General (Geriatric Medicine) Marvel Plan, MD as Consulting Physician (Neurology) Wynn Banker Victorino Sparrow, MD as Consulting Physician (Physical Medicine and Rehabilitation)  Indicate any recent Medical Services you may have received from other than [facility] providers in the past year (date may be approximate).     Assessment:   This is a routine wellness examination for Waylan.  Hearing/Vision screen  Hearing Screening   125Hz  250Hz  500Hz  1000Hz  2000Hz  3000Hz  4000Hz  6000Hz  8000Hz   Right ear:  Left ear:           Comments: Patient has no problems with vision.  Vision Screening Comments: Patient wears glasses. Patient [facility] within last year.  Dietary issues and exercise activities discussed: Current Exercise Habits: The patient does not participate in regular exercise at present, Exercise limited by: orthopedic condition(s);neurologic condition(s)  Goals    . <enter goal here>     Starting 01/10/17, I will maintain my current lifestyle.       Depression Screen PHQ 2/9 Scores 07/19/2020 01/15/2020 11/11/2019 07/17/2019 07/17/2019 04/16/2019 01/13/2019  PHQ - 2 Score 0 0 0 0 0 0 2  PHQ- 9 Score - - - - - - 10    Fall Risk Fall Risk  07/19/2020 01/15/2020 11/11/2019 07/17/2019 07/17/2019  Falls in the past year? 1 0 0 0 0  Comment - - - - -  Number falls in past yr: 0 0 0 - -  Injury with Fall? 0 0 0 0 -  Comment - - - - -    Any stairs in or around the home? Yes  If so, are there any without handrails? Yes  Home free of loose throw rugs in walkways, pet beds, electrical cords, etc? Yes  Adequate lighting in your home to reduce risk of falls? Yes   ASSISTIVE DEVICES UTILIZED TO PREVENT FALLS:  Life alert? No  Use of a cane, walker or w/c? Yes  Grab bars in the bathroom? Yes  Shower chair or bench in shower? No  Elevated toilet seat or a handicapped toilet? Yes   TIMED UP AND GO: na Cognitive Function: MMSE -  Mini Mental State Exam 01/10/2017  Orientation to time 5  Orientation to Place 5  Registration 3  Attention/ Calculation 3  Recall 3  Language- name 2 objects 2  Language- repeat 1  Language- follow 3 step command 3  Language- read & follow direction 1  Write a sentence 0  Copy design 0  Total score 26     6CIT Screen 07/19/2020 07/17/2019  What Year? 0 points 0 points  What month? 0 points 0 points  What time? 0 points 0 points  Count back from 20 0 points 0 points  Months in reverse 4 points 4 points  Repeat phrase 4 points 10 points  Total Score 8 14    Immunizations Immunization History  Administered Date(s) Administered  . Influenza Whole 10/17/2006  . Influenza, Quadrivalent, Recombinant, Inj, Pf 10/01/2018  . Influenza,inj,Quad PF,6+ Mos 09/12/2016, 09/02/2019  . Influenza-Unspecified 08/21/2013, 11/16/2014, 07/29/2015, 08/17/2017, 09/20/2017, 10/08/2018  . PFIZER SARS-COV-2 Vaccination 03/03/2020, 03/24/2020  . Pneumococcal-Unspecified 11/16/2014  . Rabies, IM 10/22/2012  . Rabies, intradermal 10/22/2012  . Tdap 10/22/2012  . Zoster Recombinat (Shingrix) 10/25/2017    TDAP status: Up to date Flu Vaccine status: Up to date Pneumococcal vaccine status: Up to date Covid-19 vaccine status: Completed vaccines  Qualifies for Shingles Vaccine? Yes   Zostavax completed Yes   Shingrix Completed?: No.    Education has been provided regarding the importance of this vaccine. Patient has been advised to call insurance company to determine out of pocket expense if they have not yet received this vaccine. Advised may also receive vaccine at local pharmacy or Health Dept. Verbalized acceptance and understanding.  Screening Tests Health Maintenance  Topic Date Due  . HIV Screening  Never done  . INFLUENZA VACCINE  07/17/2020  . Fecal DNA (Cologuard)  08/07/2021  . TETANUS/TDAP  10/22/2022  .  COVID-19 Vaccine  Completed  . Hepatitis C Screening  Completed    Health  Maintenance  Health Maintenance Due  Topic Date Due  . HIV Screening  Never done  . INFLUENZA VACCINE  07/17/2020    Colorectal cancer screening: Completed 2019. Repeat every 3 years  Lung Cancer Screening: (Low Dose CT Chest recommended if Age 92-80 years, 30 pack-year currently smoking OR have quit w/in 15years.) does not qualify.   Lung Cancer Screening Referral: na  Additional Screening:  Hepatitis C Screening: does not qualify; Completed 2015  Vision Screening: Recommended annual ophthalmology exams for early detection of glaucoma and other disorders of the eye. Is the patient up to date with their annual eye exam?  Yes  Who is the provider or what is the name of the office in which the patient attends [facility] exams[facility] If pt is not established with a provider, would they like to be referred to a provider to establish care? No .   Dental Screening: Recommended annual dental exams for proper oral hygiene  Community Resource Referral / Chronic Care Management: CRR required this visit?  No   CCM required this visit?  No      Plan:     I have personally reviewed and noted the following in the patient's chart:   . Medical and social history . Use of alcohol, tobacco or illicit drugs  . Current medications and supplements . Functional ability and status . Nutritional status . Physical activity . Advanced directives . List of other physicians . Hospitalizations, surgeries, and ER visits in previous 12 months . Vitals . Screenings to include cognitive, depression, and falls . Referrals and appointments  In addition, I have reviewed and discussed with patient certain preventive protocols, quality metrics, and best practice recommendations. A written personalized care plan for preventive services as well as general preventive health recommendations were provided to patient.     Sharon SellerJessica K Toya Palacios, NP   07/19/2020

## 2020-07-19 NOTE — Patient Instructions (Signed)
Nathan Buchanan , Thank you for taking time to come for your Medicare Wellness Visit. I appreciate your ongoing commitment to your health goals. Please review the following plan we discussed and let me know if I can assist you in the future.   Screening recommendations/referrals: Colonoscopy due 2022 for colorectal screening Recommended yearly ophthalmology/optometry visit for glaucoma screening and checkup Recommended yearly dental visit for hygiene and checkup  Vaccinations: Influenza vaccine- recommended to get at this time Pneumococcal vaccine up to date Tdap vaccine up todate Shingles vaccine recommended to get 2nd shingles vaccine through pharmacy or [facility]    Advanced directives: recommended to complete and bring to office for [place] to place on fie.   Conditions/risks identified: risk for stroke, heart disease  Next appointment: Visit date not found  Preventive Care 60 Years and Older, Male Preventive care refers to lifestyle choices and visits with your health care provider that can promote health and wellness. What does preventive care include?  A yearly physical exam. This is also called an annual well check.  Dental exams once or twice a year.  Routine eye exams. Ask your health care provider how often you should have your eyes checked.  Personal lifestyle choices, including:  Daily care of your teeth and gums.  Regular physical activity.  Eating a healthy diet.  Avoiding tobacco and drug use.  Limiting alcohol use.  Practicing safe sex.  Taking low doses of aspirin every day.  Taking vitamin and mineral supplements as recommended by your health care provider. What happens during an annual well check? The services and screenings done by your health care provider during your annual well check will depend on your age, overall health, lifestyle risk factors, and family history of disease. Counseling  Your health care provider may ask you questions about your:  Alcohol  use.  Tobacco use.  Drug use.  Emotional well-being.  Home and relationship well-being.  Sexual activity.  Eating habits.  History of falls.  Memory and ability to understand (cognition).  Work and work Astronomer. Screening  You may have the following tests or measurements:  Height, weight, and BMI.  Blood pressure.  Lipid and cholesterol levels. These may be checked every 5 years, or more frequently if you are over 51 years old.  Skin check.  Lung cancer screening. You may have this screening every year starting at age 59 if you have a 30-pack-year history of smoking and currently smoke or have quit within the past 15 years.  Fecal occult blood test (FOBT) of the stool. You may have this test every year starting at age 86.  Flexible sigmoidoscopy or colonoscopy. You may have a sigmoidoscopy every 5 years or a colonoscopy every 10 years starting at age 70.  Prostate cancer screening. Recommendations will vary depending on your family history and other risks.  Hepatitis C blood test.  Hepatitis B blood test.  Sexually transmitted disease (STD) testing.  Diabetes screening. This is done by checking your blood sugar (glucose) after you have not eaten for a while (fasting). You may have this done every 1-3 years.  Abdominal aortic aneurysm (AAA) screening. You may need this if you are a current or former smoker.  Osteoporosis. You may be screened starting at age 33 if you are at high risk. Talk with your health care provider about your test results, treatment options, and if necessary, the need for more tests. Vaccines  Your health care provider may recommend certain vaccines, such as:  Influenza vaccine.  This is recommended every year.  Tetanus, diphtheria, and acellular pertussis (Tdap, Td) vaccine. You may need a Td booster every 10 years.  Zoster vaccine. You may need this after age 50.  Pneumococcal 13-valent conjugate (PCV13) vaccine. One dose is  recommended after age 79.  Pneumococcal polysaccharide (PPSV23) vaccine. One dose is recommended after age 75. Talk to your health care provider about which screenings and vaccines you need and how often you need them. This information is not intended to replace advice given to you by your health care provider. Make sure you discuss any questions you have with your health care provider. Document Released: 12/30/2015 Document Revised: 08/22/2016 Document Reviewed: 10/04/2015 Elsevier Interactive Patient Education  2017 [employer] Prevention in the Home Falls can cause injuries. They can happen to people of all ages. There are many things you can do to make your home safe and to help prevent falls. What can I do on the outside of my home?  Regularly fix the edges of walkways and driveways and fix any cracks.  Remove anything that might make you trip as you walk through a door, such as a raised step or threshold.  Trim any bushes or trees on the path to your home.  Use bright outdoor lighting.  Clear any walking paths of anything that might make someone trip, such as rocks or tools.  Regularly check to see if handrails are loose or broken. Make sure that both sides of any steps have handrails.  Any raised decks and porches should have guardrails on the edges.  Have any leaves, snow, or ice cleared regularly.  Use sand or salt on walking paths during winter.  Clean up any spills in your garage right away. This includes oil or grease spills. What can I do in the bathroom?  Use night lights.  Install grab bars by the toilet and in the tub and shower. Do not use towel bars as grab bars.  Use non-skid mats or decals in the tub or shower.  If you need to sit down in the shower, use a plastic, non-slip stool.  Keep the floor dry. Clean up any water that spills on the floor as soon as it happens.  Remove soap buildup in the tub or shower regularly.  Attach bath mats  securely with double-sided non-slip rug tape.  Do not have throw rugs and other things on the floor that can make you trip. What can I do in the bedroom?  Use night lights.  Make sure that you have a light by your bed that is easy to reach.  Do not use any sheets or blankets that are too big for your bed. They should not hang down onto the floor.  Have a firm chair that has side arms. You can use this for support while you get dressed.  Do not have throw rugs and other things on the floor that can make you trip. What can I do in the kitchen?  Clean up any spills right away.  Avoid walking on wet floors.  Keep items that you use a lot in easy-to-reach places.  If you need to reach something above you, use a strong step stool that has a grab bar.  Keep electrical cords out of the way.  Do not use floor polish or wax that makes floors slippery. If you must use wax, use non-skid floor wax.  Do not have throw rugs and other things on the floor that can make  you trip. What can I do with my stairs?  Do not leave any items on the stairs.  Make sure that there are handrails on both sides of the stairs and use them. Fix handrails that are broken or loose. Make sure that handrails are as long as the stairways.  Check any carpeting to make sure that it is firmly attached to the stairs. Fix any carpet that is loose or worn.  Avoid having throw rugs at the top or bottom of the stairs. If you do have throw rugs, attach them to the floor with carpet tape.  Make sure that you have a light switch at the top of the stairs and the bottom of the stairs. If you do not have them, ask someone to add them for you. What else can I do to help prevent falls?  Wear shoes that:  Do not have high heels.  Have rubber bottoms.  Are comfortable and fit you well.  Are closed at the toe. Do not wear sandals.  If you use a stepladder:  Make sure that it is fully opened. Do not climb a closed  stepladder.  Make sure that both sides of the stepladder are locked into place.  Ask someone to hold it for you, if possible.  Clearly mark and make sure that you can see:  Any grab bars or handrails.  First and last steps.  Where the edge of each step is.  Use tools that help you move around (mobility aids) if they are needed. These include:  Canes.  Walkers.  Scooters.  Crutches.  Turn on the lights when you go into a dark area. Replace any light bulbs as soon as they burn out.  Set up your furniture so you have a clear path. Avoid moving your furniture around.  If any of your floors are uneven, fix them.  If there are any pets around you, be aware of where they are.  Review your medicines with your doctor. Some medicines can make you feel dizzy. This can increase your chance of falling. Ask your doctor what other things that you can do to help prevent falls. This information is not intended to replace advice given to you by your health care provider. Make sure you discuss any questions you have with your health care provider. Document Released: 09/29/2009 Document Revised: 05/10/2016 Document Reviewed: 01/07/2015 Elsevier Interactive Patient Education  2017 [employer].

## 2020-07-19 NOTE — Telephone Encounter (Signed)
Mr. Nathan Buchanan, Nathan Buchanan are scheduled for a virtual visit with your provider today.    Just as we do with appointments in the office, we must obtain your consent to participate.  Your consent will be active for this visit and any virtual visit you may have with one of our providers in the next 365 days.    If you have a MyChart account, I can also send a copy of this consent to you electronically.  All virtual visits are billed to your insurance company just like a traditional visit in the office.  As this is a virtual visit, video technology does not allow for your provider to perform a traditional examination.  This may limit your provider's ability to fully assess your condition.  If your provider identifies any concerns that need to be evaluated in person or the need to arrange testing such as labs, EKG, etc, we will make arrangements to do so.    Although advances in technology are sophisticated, we cannot ensure that it will always work on either your end or our end.  If the connection with a video visit is poor, we may have to switch to a telephone visit.  With either a video or telephone visit, we are not always able to ensure that we have a secure connection.   I need to obtain your verbal consent now.   Are you willing to proceed with your visit today?   Nathan Buchanan has provided verbal consent on 07/19/2020 for a virtual visit (video or telephone).   Elveria Royals, CMA 07/19/2020  1:29 PM

## 2020-07-19 NOTE — Progress Notes (Signed)
This service is provided via telemedicine  No vital signs collected/recorded due to the encounter was a telemedicine visit.   Location of patient (ex: home, work):  Home  Patient consents to a telephone visit:  Yes, see encounter dated 07/19/2020  Location of the provider (ex: office, home):  Home  Name of any referring provider:  N/A  Names of all persons participating in the telemedicine service and their role in the encounter:  Abbey Chatters, Nurse Practitioner, Elveria Royals, CMA, and patient.   Time spent on call:  7 Minutes with medical assitant.

## 2021-07-20 ENCOUNTER — Other Ambulatory Visit: Payer: Self-pay

## 2021-07-20 ENCOUNTER — Encounter: Payer: Medicare Other | Admitting: Nurse Practitioner

## 2021-07-20 ENCOUNTER — Telehealth: Payer: Self-pay

## 2021-07-20 NOTE — Telephone Encounter (Signed)
I made 3 unsuccessful attempts to reach patient for annual wellness visit. I left messages with each attempt. Patient was advised on 3rd and final attempt that he would need to call to reschedule appointment. I also left a message for patient to call and let us know if he was still our patient.  1st attempt-2:52 pm 2nd attempt-3:04 pm 3rd attempt-3:24pm
